# Patient Record
Sex: Female | Born: 1950 | Race: White | Hispanic: No | Marital: Single | Smoking: Never smoker
Health system: Southern US, Community
[De-identification: ages and names within clinical notes are randomized; demographics above are authoritative.]

## PROBLEM LIST (undated history)

## (undated) DIAGNOSIS — Z8669 Personal history of other diseases of the nervous system and sense organs: Secondary | ICD-10-CM

## (undated) DIAGNOSIS — M179 Osteoarthritis of knee, unspecified: Secondary | ICD-10-CM

## (undated) DIAGNOSIS — Z972 Presence of dental prosthetic device (complete) (partial): Secondary | ICD-10-CM

## (undated) DIAGNOSIS — M8580 Other specified disorders of bone density and structure, unspecified site: Secondary | ICD-10-CM

## (undated) DIAGNOSIS — C50919 Malignant neoplasm of unspecified site of unspecified female breast: Secondary | ICD-10-CM

## (undated) DIAGNOSIS — E669 Obesity, unspecified: Secondary | ICD-10-CM

## (undated) DIAGNOSIS — R2689 Other abnormalities of gait and mobility: Secondary | ICD-10-CM

## (undated) DIAGNOSIS — M1710 Unilateral primary osteoarthritis, unspecified knee: Secondary | ICD-10-CM

## (undated) DIAGNOSIS — D0510 Intraductal carcinoma in situ of unspecified breast: Secondary | ICD-10-CM

## (undated) HISTORY — DX: Other specified disorders of bone density and structure, unspecified site: M85.80

## (undated) HISTORY — DX: Other abnormalities of gait and mobility: R26.89

## (undated) HISTORY — DX: Osteoarthritis of knee, unspecified: M17.9

## (undated) HISTORY — PX: MULTIPLE TOOTH EXTRACTIONS: SHX2053

## (undated) HISTORY — DX: Personal history of other diseases of the nervous system and sense organs: Z86.69

## (undated) HISTORY — DX: Unilateral primary osteoarthritis, unspecified knee: M17.10

## (undated) HISTORY — DX: Obesity, unspecified: E66.9

## (undated) HISTORY — DX: Intraductal carcinoma in situ of unspecified breast: D05.10

## (undated) HISTORY — DX: Malignant neoplasm of unspecified site of unspecified female breast: C50.919

---

## 2007-10-07 ENCOUNTER — Ambulatory Visit (HOSPITAL_COMMUNITY): Admission: RE | Admit: 2007-10-07 | Discharge: 2007-10-07 | Payer: Self-pay | Admitting: Family Medicine

## 2009-12-16 HISTORY — PX: MASTECTOMY, PARTIAL: SHX709

## 2010-03-21 ENCOUNTER — Ambulatory Visit (HOSPITAL_COMMUNITY): Admission: RE | Admit: 2010-03-21 | Discharge: 2010-03-21 | Payer: Self-pay | Admitting: Family Medicine

## 2010-03-30 ENCOUNTER — Encounter: Admission: RE | Admit: 2010-03-30 | Discharge: 2010-03-30 | Payer: Self-pay | Admitting: Family Medicine

## 2010-04-16 ENCOUNTER — Ambulatory Visit (HOSPITAL_COMMUNITY): Payer: Self-pay | Admitting: Oncology

## 2010-04-16 ENCOUNTER — Encounter (HOSPITAL_COMMUNITY): Admission: RE | Admit: 2010-04-16 | Discharge: 2010-05-16 | Payer: Self-pay | Admitting: Oncology

## 2010-04-30 ENCOUNTER — Ambulatory Visit (HOSPITAL_COMMUNITY): Admission: RE | Admit: 2010-04-30 | Discharge: 2010-04-30 | Payer: Self-pay | Admitting: Oncology

## 2010-06-04 ENCOUNTER — Ambulatory Visit (HOSPITAL_COMMUNITY): Payer: Self-pay | Admitting: Oncology

## 2010-07-18 ENCOUNTER — Ambulatory Visit (HOSPITAL_COMMUNITY): Admission: RE | Admit: 2010-07-18 | Discharge: 2010-07-18 | Payer: Self-pay | Admitting: Oncology

## 2010-08-27 ENCOUNTER — Inpatient Hospital Stay (HOSPITAL_COMMUNITY): Admission: RE | Admit: 2010-08-27 | Discharge: 2010-08-29 | Payer: Self-pay | Admitting: General Surgery

## 2010-08-27 ENCOUNTER — Encounter (INDEPENDENT_AMBULATORY_CARE_PROVIDER_SITE_OTHER): Payer: Self-pay | Admitting: General Surgery

## 2010-10-04 ENCOUNTER — Ambulatory Visit
Admission: RE | Admit: 2010-10-04 | Discharge: 2010-12-13 | Payer: Self-pay | Source: Home / Self Care | Attending: Radiation Oncology | Admitting: Radiation Oncology

## 2010-10-19 ENCOUNTER — Encounter (HOSPITAL_COMMUNITY): Admission: RE | Admit: 2010-10-19 | Discharge: 2010-11-18 | Payer: Self-pay | Admitting: Oncology

## 2010-10-19 ENCOUNTER — Ambulatory Visit (HOSPITAL_COMMUNITY): Payer: Self-pay | Admitting: Oncology

## 2011-01-06 ENCOUNTER — Encounter: Payer: Self-pay | Admitting: Family Medicine

## 2011-01-21 ENCOUNTER — Encounter (HOSPITAL_COMMUNITY): Admission: RE | Admit: 2011-01-21 | Payer: Self-pay | Source: Home / Self Care | Admitting: Oncology

## 2011-01-21 ENCOUNTER — Other Ambulatory Visit (HOSPITAL_COMMUNITY): Payer: Self-pay | Admitting: Oncology

## 2011-01-21 ENCOUNTER — Ambulatory Visit (HOSPITAL_COMMUNITY): Payer: Medicaid Other | Admitting: Oncology

## 2011-01-21 ENCOUNTER — Encounter (HOSPITAL_COMMUNITY): Payer: Medicaid Other | Attending: Oncology

## 2011-01-21 DIAGNOSIS — C50919 Malignant neoplasm of unspecified site of unspecified female breast: Secondary | ICD-10-CM | POA: Insufficient documentation

## 2011-01-21 DIAGNOSIS — D0590 Unspecified type of carcinoma in situ of unspecified breast: Secondary | ICD-10-CM

## 2011-01-21 DIAGNOSIS — Z79899 Other long term (current) drug therapy: Secondary | ICD-10-CM | POA: Insufficient documentation

## 2011-01-21 LAB — CBC
Hemoglobin: 12.9 g/dL (ref 12.0–15.0)
MCH: 30.7 pg (ref 26.0–34.0)
MCHC: 34.3 g/dL (ref 30.0–36.0)
MCV: 89.5 fL (ref 78.0–100.0)
RDW: 12.8 % (ref 11.5–15.5)
WBC: 6.4 10*3/uL (ref 4.0–10.5)

## 2011-01-21 LAB — DIFFERENTIAL
Eosinophils Absolute: 0.2 10*3/uL (ref 0.0–0.7)
Eosinophils Relative: 3 % (ref 0–5)
Lymphocytes Relative: 21 % (ref 12–46)
Monocytes Absolute: 0.4 10*3/uL (ref 0.1–1.0)
Neutrophils Relative %: 70 % (ref 43–77)

## 2011-01-21 LAB — COMPREHENSIVE METABOLIC PANEL
ALT: 18 U/L (ref 0–35)
Alkaline Phosphatase: 54 U/L (ref 39–117)
CO2: 25 mEq/L (ref 19–32)
Creatinine, Ser: 0.81 mg/dL (ref 0.4–1.2)
GFR calc Af Amer: >60 mL/min (ref >60)
GFR calc non Af Amer: >60 mL/min (ref >60)
Glucose, Bld: 110 mg/dL — ABNORMAL HIGH (ref 70–99)
Sodium: 141 mEq/L (ref 135–145)
Total Bilirubin: 0.6 mg/dL (ref 0.3–1.2)

## 2011-02-28 LAB — CBC
HCT: 36.4 % (ref 36.0–46.0)
Hemoglobin: 10.9 g/dL — ABNORMAL LOW (ref 12.0–15.0)
MCH: 30.9 pg (ref 26.0–34.0)
MCH: 31 pg (ref 26.0–34.0)
MCHC: 33.4 g/dL (ref 30.0–36.0)
MCHC: 33.5 g/dL (ref 30.0–36.0)
MCHC: 33.6 g/dL (ref 30.0–36.0)
MCHC: 33.9 g/dL (ref 30.0–36.0)
MCV: 92.2 fL (ref 78.0–100.0)
MCV: 92.5 fL (ref 78.0–100.0)
Platelets: 219 10*3/uL (ref 150–400)
Platelets: 261 10*3/uL (ref 150–400)
RBC: 3.32 MIL/uL — ABNORMAL LOW (ref 3.87–5.11)
RBC: 3.51 MIL/uL — ABNORMAL LOW (ref 3.87–5.11)
RBC: 3.98 MIL/uL (ref 3.87–5.11)
RDW: 13 % (ref 11.5–15.5)
RDW: 13.2 % (ref 11.5–15.5)
WBC: 8.7 10*3/uL (ref 4.0–10.5)

## 2011-02-28 LAB — BASIC METABOLIC PANEL
BUN: 11 mg/dL (ref 6–23)
Calcium: 9.1 mg/dL (ref 8.4–10.5)
Chloride: 103 mEq/L (ref 96–112)
Creatinine, Ser: 0.73 mg/dL (ref 0.4–1.2)
GFR calc Af Amer: >60 mL/min (ref >60)
GFR calc Af Amer: >60 mL/min (ref >60)
GFR calc non Af Amer: >60 mL/min (ref >60)
Sodium: 142 mEq/L (ref 135–145)

## 2011-02-28 LAB — DIFFERENTIAL
Basophils Absolute: 0 10*3/uL (ref 0.0–0.1)
Basophils Absolute: 0 10*3/uL (ref 0.0–0.1)
Basophils Absolute: 0 10*3/uL (ref 0.0–0.1)
Basophils Absolute: 0.2 10*3/uL — ABNORMAL HIGH (ref 0.0–0.1)
Basophils Relative: 0 % (ref 0–1)
Basophils Relative: 1 % (ref 0–1)
Basophils Relative: 2 % — ABNORMAL HIGH (ref 0–1)
Eosinophils Relative: 0 % (ref 0–5)
Lymphocytes Relative: 36 % (ref 12–46)
Lymphs Abs: 2.2 10*3/uL (ref 0.7–4.0)
Lymphs Abs: 2.7 10*3/uL (ref 0.7–4.0)
Monocytes Absolute: 0.1 10*3/uL (ref 0.1–1.0)
Monocytes Absolute: 0.4 10*3/uL (ref 0.1–1.0)
Monocytes Absolute: 0.5 10*3/uL (ref 0.1–1.0)
Monocytes Relative: 1 % — ABNORMAL LOW (ref 3–12)
Monocytes Relative: 5 % (ref 3–12)
Monocytes Relative: 6 % (ref 3–12)
Neutro Abs: 5.9 10*3/uL (ref 1.7–7.7)
Neutrophils Relative %: 56 % (ref 43–77)
Neutrophils Relative %: 68 % (ref 43–77)
Neutrophils Relative %: 80 % — ABNORMAL HIGH (ref 43–77)

## 2011-02-28 LAB — SURGICAL PCR SCREEN
MRSA, PCR: NEGATIVE
Staphylococcus aureus: POSITIVE — AB

## 2011-02-28 LAB — TYPE AND SCREEN
ABO/RH(D): O POS
Antibody Screen: NEGATIVE

## 2011-02-28 LAB — GLUCOSE, CAPILLARY: Glucose-Capillary: 139 mg/dL — ABNORMAL HIGH (ref 70–99)

## 2011-03-05 LAB — COMPREHENSIVE METABOLIC PANEL
Albumin: 4 g/dL (ref 3.5–5.2)
Alkaline Phosphatase: 58 U/L (ref 39–117)
BUN: 14 mg/dL (ref 6–23)
Calcium: 9.3 mg/dL (ref 8.4–10.5)
Creatinine, Ser: 0.74 mg/dL (ref 0.4–1.2)
Potassium: 3.7 mEq/L (ref 3.5–5.1)
Total Protein: 7.4 g/dL (ref 6.0–8.3)

## 2011-03-05 LAB — CBC
HCT: 38.2 % (ref 36.0–46.0)
Platelets: 277 10*3/uL (ref 150–400)
RDW: 13.2 % (ref 11.5–15.5)

## 2011-03-05 LAB — DIFFERENTIAL
Lymphocytes Relative: 27 % (ref 12–46)
Lymphs Abs: 2.1 10*3/uL (ref 0.7–4.0)
Monocytes Absolute: 0.4 10*3/uL (ref 0.1–1.0)
Monocytes Relative: 5 % (ref 3–12)
Neutro Abs: 5.1 10*3/uL (ref 1.7–7.7)

## 2011-03-05 LAB — LUTEINIZING HORMONE: LH: 39.6 m[IU]/mL

## 2011-03-05 LAB — ANA: Anti Nuclear Antibody(ANA): NEGATIVE

## 2011-06-21 ENCOUNTER — Ambulatory Visit
Admission: RE | Admit: 2011-06-21 | Discharge: 2011-06-21 | Disposition: A | Discharge status: Home / Self Care | Payer: Medicaid Other | Source: Ambulatory Visit | Attending: Radiation Oncology | Admitting: Radiation Oncology

## 2011-07-22 ENCOUNTER — Encounter (HOSPITAL_COMMUNITY): Payer: Medicaid Other | Attending: Oncology | Admitting: Oncology

## 2011-07-22 ENCOUNTER — Ambulatory Visit (HOSPITAL_COMMUNITY): Payer: Medicaid Other

## 2011-07-22 ENCOUNTER — Encounter (HOSPITAL_COMMUNITY): Payer: Self-pay | Admitting: Oncology

## 2011-07-22 VITALS — BP 138/80 | HR 99 | Temp 98.3°F | Wt 260.1 lb

## 2011-07-22 DIAGNOSIS — D0590 Unspecified type of carcinoma in situ of unspecified breast: Secondary | ICD-10-CM

## 2011-07-22 DIAGNOSIS — Z79899 Other long term (current) drug therapy: Secondary | ICD-10-CM | POA: Insufficient documentation

## 2011-07-22 DIAGNOSIS — D0510 Intraductal carcinoma in situ of unspecified breast: Secondary | ICD-10-CM | POA: Insufficient documentation

## 2011-07-22 DIAGNOSIS — C50919 Malignant neoplasm of unspecified site of unspecified female breast: Secondary | ICD-10-CM

## 2011-07-22 HISTORY — DX: Intraductal carcinoma in situ of unspecified breast: D05.10

## 2011-07-22 HISTORY — DX: Malignant neoplasm of unspecified site of unspecified female breast: C50.919

## 2011-07-22 LAB — COMPREHENSIVE METABOLIC PANEL
ALT: 12 U/L (ref 0–35)
Albumin: 3.9 g/dL (ref 3.5–5.2)
Alkaline Phosphatase: 64 U/L (ref 39–117)
BUN: 14 mg/dL (ref 6–23)
Chloride: 103 mEq/L (ref 96–112)
GFR calc Af Amer: >60 mL/min (ref >60)
Glucose, Bld: 112 mg/dL — ABNORMAL HIGH (ref 70–99)
Potassium: 4.3 mEq/L (ref 3.5–5.1)
Sodium: 143 mEq/L (ref 135–145)
Total Bilirubin: 0.5 mg/dL (ref 0.3–1.2)

## 2011-07-22 LAB — CBC
HCT: 41.1 % (ref 36.0–46.0)
Hemoglobin: 13.6 g/dL (ref 12.0–15.0)
WBC: 6.5 10*3/uL (ref 4.0–10.5)

## 2011-07-22 LAB — DIFFERENTIAL
Basophils Relative: 0 % (ref 0–1)
Eosinophils Absolute: 0.1 10*3/uL (ref 0.0–0.7)
Eosinophils Relative: 1 % (ref 0–5)
Monocytes Relative: 6 % (ref 3–12)
Neutrophils Relative %: 71 % (ref 43–77)

## 2011-07-22 NOTE — Progress Notes (Signed)
Deanna Reichert, MD [address] [place] [address]  1. DCIS (ductal carcinoma in situ) of breast  CBC, Differential, Comprehensive metabolic panel  2. Invasive ductal carcinoma of breast      CURRENT THERAPY: On Femara daily.  S/P radiation on 10/26/11- 12/13/10.  S/P modified radical mastectomy on left   INTERVAL HISTORY: Deanna Henderson 60 y.o. female returns for  regular  visit for followup of Ductal Carcinoma In Situ.  On biopsy, an invasive component was seen with papillary features, but at time of surgery only DCIS was discovered.  See path below for more details.   The patient denies any complaints today.  She reports that she has not had any fevers, chills, night sweats, myalgias, arthralgias, and hot flashes.  She reports that her joint aches are no worse since starting femara.  She denies any tobacco abuse.  She does explain that her knees have been bothering her more today because yesterday her apartment complex underwent a fire drill and she had to stand for a long period of time.  And then she had to go to [employer] for shopping.  She reports that she had an increase in activity yesterday and associates her knee pain with that.  The patient explains that she will be undergoing a mammogram in the near future.  She explains that she has "cycts" that appear and then resolve in a few days.  She explains that sometimes they are tender.  Past Medical History  Diagnosis Date  . 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 Invasive ductal carcinoma of breast 07/22/2011    has DCIS (ductal carcinoma in situ) of breast and Invasive ductal carcinoma of breast on her problem list.      has no known allergies.  Ms. Mcauliffe does not currently have medications on file.  No past surgical history on file.  Denies any headaches, dizziness, double vision, fevers, chills, night sweats, nausea, vomiting, diarrhea, constipation, chest pain, heart palpitations, shortness of  breath, blood in stool, black tarry stool, urinary pain, urinary burning, urinary frequency, hematuria.   PHYSICAL EXAMINATION  ECOG PERFORMANCE STATUS: 2 - Symptomatic, <50% confined to bed  Filed Vitals:   07/22/11 0951  BP: 138/80  Pulse: 99  Temp: 98.3 F (36.8 C)    GENERAL:alert, no distress, well nourished, well developed, comfortable, cooperative and obese SKIN: skin color, texture, turgor are normal, no rashes or significant lesions HEAD: Normocephalic, No masses, lesions, tenderness or abnormalities EYES: normal, PERRLA, EOMI EARS: External ears normal OROPHARYNX:mucous membranes are moist  NECK: supple, no adenopathy, no bruits, no JVD, thyroid normal size, non-tender, without nodularity, no stridor, non-tender, trachea midline LYMPH:  no palpable lymphadenopathy, no hepatosplenomegaly BREAST:right breast normal without mass, skin or nipple changes or axillary nodes, Left breast reveals mastectomy scar.  No abnormalities or masses noted. LUNGS: clear to auscultation and percussion HEART: regular rate & rhythm, no murmurs, no gallops, S1 normal and S2 normal ABDOMEN:abdomen soft, non-tender, obese, normal bowel sounds and no hepatosplenomegaly BACK: Back symmetric, no curvature., No CVA tenderness EXTREMITIES:less then 2 second capillary refill, no joint deformities, effusion, or inflammation, no edema  NEURO: alert & oriented x 3 with fluent speech, no focal motor/sensory deficits, gait normal  PENDING LABS: CBC diff, CMET   PATHOLOGY: 1. Breast, modified radical mastectomy, left and axilla- residual ductal carcinoma in situ, papillary pattern, 5.0 cm.  No stromal invasion identified.  All final resection margins clear, 07 lymph nodes for metastatic disease.  Er 100%, PR 100%, Ki-67 16%, Her 2 negative.    ASSESSMENT:  1. DCIS, s/p mastectomy on left. S/p radiation from 10/25/10- 12/13/10.  On Femara daily. 2. Obese   PLAN:  1. The patient will be seeing her PCP  soon.  She will have her yearly mammogram scheduled at that time. 2. We will keep an eye out for mammogram results 3. Lab work today: CBC diff, CMET 4. Return in 6 months for follow-up.   All questions were answered. The patient knows to call the clinic with any problems, questions or concerns. We can certainly see the patient much sooner if necessary.  The patient and plan discussed with Pierce Crane, MD, [facility] and he is in agreement with the aforementioned.  I spent 25 minutes counseling the patient face to face. The total time spent in the appointment was 40 minutes.  KEFALAS,THOMAS

## 2011-07-22 NOTE — Patient Instructions (Signed)
[facility]  Discharge Instructions  RECOMMENDATIONS MADE BY THE CONSULTANT AND ANY TEST RESULTS WILL BE SENT TO YOUR REFERRING DOCTOR.   EXAM FINDINGS BY MD TODAY AND SIGNS AND SYMPTOMS TO REPORT TO CLINIC OR PRIMARY MD: Exam good. You are doing good on your exam.   INSTRUCTIONS GIVEN AND DISCUSSED: Continue taking your Femara. We want to see you back in 6 months. Don't forget to have your mammogram (in which your primary doctor should be setting up). Come back before 6 months if you need to see [place].    I acknowledge that I have been informed and understand all the instructions given to me and received a copy. I do not have any more questions at this time, but understand that I may call the Specialty Clinic at [facility] at [phone] during business hours should I have any further questions or need assistance in obtaining follow-up care.    __________________________________________  _____________  __________ Signature of Patient or Authorized Representative            Date                   Time    __________________________________________ Nurse's Signature

## 2011-07-24 ENCOUNTER — Other Ambulatory Visit (HOSPITAL_COMMUNITY): Payer: Self-pay | Admitting: Family Medicine

## 2011-07-24 DIAGNOSIS — C50919 Malignant neoplasm of unspecified site of unspecified female breast: Secondary | ICD-10-CM

## 2011-08-07 ENCOUNTER — Ambulatory Visit (HOSPITAL_COMMUNITY)
Admission: RE | Admit: 2011-08-07 | Discharge: 2011-08-07 | Disposition: A | Discharge status: Home / Self Care | Payer: Medicaid Other | Source: Ambulatory Visit | Attending: Family Medicine | Admitting: Family Medicine

## 2011-08-07 DIAGNOSIS — C50919 Malignant neoplasm of unspecified site of unspecified female breast: Secondary | ICD-10-CM

## 2011-08-07 DIAGNOSIS — Z853 Personal history of malignant neoplasm of breast: Secondary | ICD-10-CM | POA: Insufficient documentation

## 2012-01-22 ENCOUNTER — Encounter (HOSPITAL_COMMUNITY): Payer: Medicaid Other | Attending: Oncology | Admitting: Oncology

## 2012-01-22 ENCOUNTER — Encounter (HOSPITAL_COMMUNITY): Payer: Self-pay | Admitting: Oncology

## 2012-01-22 VITALS — BP 130/98 | HR 76 | Temp 98.2°F | Wt 267.5 lb

## 2012-01-22 DIAGNOSIS — C50919 Malignant neoplasm of unspecified site of unspecified female breast: Secondary | ICD-10-CM | POA: Insufficient documentation

## 2012-01-22 DIAGNOSIS — E669 Obesity, unspecified: Secondary | ICD-10-CM | POA: Insufficient documentation

## 2012-01-22 HISTORY — DX: Obesity, unspecified: E66.9

## 2012-01-22 LAB — CBC
MCH: 29.8 pg (ref 26.0–34.0)
MCV: 91.5 fL (ref 78.0–100.0)
Platelets: 232 10*3/uL (ref 150–400)
RBC: 4.1 MIL/uL (ref 3.87–5.11)
RDW: 12.8 % (ref 11.5–15.5)

## 2012-01-22 LAB — DIFFERENTIAL
Basophils Absolute: 0 10*3/uL (ref 0.0–0.1)
Basophils Relative: 0 % (ref 0–1)
Eosinophils Absolute: 0.1 10*3/uL (ref 0.0–0.7)
Eosinophils Relative: 1 % (ref 0–5)
Lymphs Abs: 1.4 10*3/uL (ref 0.7–4.0)
Neutrophils Relative %: 73 % (ref 43–77)

## 2012-01-22 LAB — COMPREHENSIVE METABOLIC PANEL
ALT: 13 U/L (ref 0–35)
AST: 17 U/L (ref 0–37)
Albumin: 3.9 g/dL (ref 3.5–5.2)
Alkaline Phosphatase: 58 U/L (ref 39–117)
Calcium: 9.9 mg/dL (ref 8.4–10.5)
GFR calc Af Amer: >90 mL/min (ref >90)
Glucose, Bld: 100 mg/dL — ABNORMAL HIGH (ref 70–99)
Potassium: 3.7 mEq/L (ref 3.5–5.1)
Sodium: 140 mEq/L (ref 135–145)
Total Protein: 7.3 g/dL (ref 6.0–8.3)

## 2012-01-22 NOTE — Progress Notes (Signed)
Henderson Reichert, MD, MD [address] [place] [address]  1. Invasive ductal carcinoma of breast  Nutritional Supplements (EQUATE PO), acetaminophen (TYLENOL) 500 MG tablet, calcium carbonate (TUMS EX) 750 MG chewable tablet, sodium chloride (OCEAN) 0.65 % nasal spray, CBC, Differential, Comprehensive metabolic panel  2. Obesity      CURRENT THERAPY:On Femara daily. S/P radiation on 10/25/10- 12/13/10. S/P modified radical mastectomy on left    INTERVAL HISTORY: Deanna Henderson 61 y.o. female returns for  regular  visit for followup of  Ductal Carcinoma In Situ. On biopsy, an invasive component was seen with papillary features, but at time of surgery only DCIS was discovered. See path below for more details.   The patient continues to take Femara daily.  She is tolerating the medication well.  She is seen with a 4 legged can today.  Her gait is stable once she is up ambulating.    The patient reports that she lost some weight prior to the [place], but then gained it back plus a few pounds.  She weighs 267.5 lbs today.  I personally reviewed and went over radiographic studies with the patient.  I personally reviewed and went over laboratory results with the patient.   ROS: No TIA's or unusual headaches, no dysphagia.  No prolonged cough. No dyspnea or chest pain on exertion.  No abdominal pain, change in bowel habits, black or bloody stools.  No urinary tract symptoms.  No new or unusual musculoskeletal symptoms.  Normal menses, no abnormal vaginal bleeding, discharge or unexpected pelvic pain. No new breast lumps, breast pain or nipple discharge.   Past Medical History  Diagnosis Date  . 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 History of double vision   . Balance problems   . Obesity 01/22/2012    has DCIS (ductal carcinoma in situ) of breast; Invasive ductal carcinoma of breast; and Obesity on her problem list.       is allergic to codeine.  Ms. Smyre does not currently have medications on file.  Past Surgical History  Procedure Date  . Mastectomy, partial     Denies any headaches, dizziness, double vision, fevers, chills, night sweats, nausea, vomiting, diarrhea, constipation, chest pain, heart palpitations, shortness of breath, blood in stool, black tarry stool, urinary pain, urinary burning, urinary frequency, hematuria.   PHYSICAL EXAMINATION  ECOG PERFORMANCE STATUS: 2 - Symptomatic, <50% confined to bed  Filed Vitals:   01/22/12 0952  BP: 130/98  Pulse: 76  Temp: 98.2 F (36.8 C)    GENERAL:alert, no distress, well nourished, well developed, comfortable, cooperative, obese and flat affect SKIN: skin color, texture, turgor are normal, no rashes or significant lesions HEAD: Normocephalic, No masses, lesions, tenderness or abnormalities EYES: normal EARS: External ears normal OROPHARYNX:mucous membranes are moist  NECK: supple, no adenopathy, no bruits, thyroid normal size, non-tender, without nodularity, no stridor, non-tender, trachea midline LYMPH:  no palpable lymphadenopathy, no hepatosplenomegaly BREAST:right breast normal without mass, skin or nipple changes or axillary nodes, left breast post-mastectomy site well healed and free of suspicious changes LUNGS: clear to auscultation and percussion HEART: regular rate & rhythm, no murmurs, no gallops, S1 normal and S2 normal ABDOMEN:abdomen soft, non-tender, obese, normal bowel sounds, no masses or organomegaly and no hepatosplenomegaly BACK: Back symmetric, no curvature., No CVA tenderness EXTREMITIES:less then 2 second capillary refill, no joint deformities, effusion, or inflammation, no skin discoloration, no clubbing, no cyanosis, positive  findings:  edema B/L 2+ non-pitting edema  NEURO: alert & oriented x 3 with fluent speech, no focal motor/sensory deficits   LABORATORY DATA: CBC    Component Value Date/Time   WBC  6.5 07/22/2011 1113   RBC 4.45 07/22/2011 1113   HGB 13.6 07/22/2011 1113   HCT 41.1 07/22/2011 1113   PLT 239 07/22/2011 1113   MCV 92.4 07/22/2011 1113   MCH 30.6 07/22/2011 1113   MCHC 33.1 07/22/2011 1113   RDW 12.8 07/22/2011 1113   LYMPHSABS 1.4 07/22/2011 1113   MONOABS 0.4 07/22/2011 1113   EOSABS 0.1 07/22/2011 1113   BASOSABS 0.0 07/22/2011 1113      Chemistry      Component Value Date/Time   NA 143 07/22/2011 1113   K 4.3 07/22/2011 1113   CL 103 07/22/2011 1113   CO2 25 07/22/2011 1113   BUN 14 07/22/2011 1113   CREATININE 0.67 07/22/2011 1113      Component Value Date/Time   CALCIUM 10.2 07/22/2011 1113   ALKPHOS 64 07/22/2011 1113   AST 18 07/22/2011 1113   ALT 12 07/22/2011 1113   BILITOT 0.5 07/22/2011 1113        PENDING LABS: CBC diff, CMET   RADIOGRAPHIC STUDIES:  08/07/2011  *RADIOLOGY REPORT*  Clinical Data: The patient underwent left modified radical  mastectomy for breast cancer in 2011.  DIGITAL DIAGNOSTIC BILATERAL MAMMOGRAM WITH CAD  Comparison: 03/21/2010 bilaterally. 07/18/2010 on the left.  Findings: On the right, there are scattered fibroglandular  densities. There is no dominant mass, architectural distortion or  calcification to suggest malignancy. The technologist obtained  images of the mastectomy site on the left but no abnormality is  noted on this side. The patient states that her surgeon left some  fatty tissue on the left to allow for future reconstruction should  she so desire.  Mammographic images were processed with CAD.  IMPRESSION:  No mammographic evidence of malignancy. Yearly screening  mammography of the right breast would be suggested.  BI-RADS CATEGORY 1: Negative.  Original Report Authenticated By: Daryl Eastern, M.D.    PATHOLOGY: 1. Breast, modified radical mastectomy, left and axilla- residual ductal carcinoma in situ, papillary pattern, 5.0 cm. No stromal invasion identified. All final resection margins clear, 0/7 lymph nodes for metastatic  disease. Er 100%, PR 100%, Ki-67 16%, Her 2 negative.     ASSESSMENT: 1. DCIS, s/p mastectomy on left. S/p radiation from 10/25/10- 12/13/10. On Femara daily which she is tolerating well.  2. Obese    PLAN:  1. Lab work today: CBC diff, CMET 2. I personally reviewed and went over radiographic studies with the patient. 3. I personally reviewed and went over laboratory results with the patient. 4. Continue taking Femara daily 5. Return in 6 months for follow-up.    All questions were answered. The patient knows to call the clinic with any problems, questions or concerns. We can certainly see the patient much sooner if necessary.  The patient and plan discussed with Glenford Peers, MD and he is in agreement with the aforementioned.  I spent 20 minutes counseling the patient face to face. The total time spent in the appointment was 30 minutes.  KEFALAS,THOMAS

## 2012-01-22 NOTE — Patient Instructions (Signed)
Deanna Henderson Asencio  [id number] 19-Aug-1951  [facility]  Discharge Instructions  RECOMMENDATIONS MADE BY THE CONSULTANT AND ANY TEST RESULTS WILL BE SENT TO YOUR REFERRING DOCTOR.   EXAM FINDINGS BY MD TODAY AND SIGNS AND SYMPTOMS TO REPORT TO CLINIC OR PRIMARY MD: you are doing well.  Continue letrazole.  MEDICATIONS PRESCRIBED: none   INSTRUCTIONS GIVEN AND DISCUSSED: Report any new lumps, bone pain or shortness of breath.  SPECIAL INSTRUCTIONS/FOLLOW-UP: Lab work Needed today and Return to Clinic in 6 months.   I acknowledge that I have been informed and understand all the instructions given to me and received a copy. I do not have any more questions at this time, but understand that I may call the [facility]ty Clinic at [facility] at [phone] during business hours should I have any further questions or need assistance in obtaining follow-up care.    __________________________________________  _____________  __________ Signature of Patient or Authorized Representative            Date                   Time    __________________________________________ Nurse's Signature

## 2012-01-22 NOTE — Progress Notes (Signed)
Deanna Henderson presented for labwork. Labs per MD order drawn via Peripheral Line 23 gauge needle inserted in right hand  Good blood return present. Procedure without incident.  Needle removed intact. Patient tolerated procedure well.

## 2012-03-20 IMAGING — MG MM DIGITAL DIAGNOSTIC BILAT CAD
7 of 13 series · 7 of 13 positions shown · non-contrast
Comparison: There are no prior studies available for comparison
(this is the patient's baseline study).

CLINICAL DATA: The patient has noticed spontaneous bloody left
breast nipple discharge with inversion of the left nipple.  Also,
there is thickening within the superior portion of the left breast.

DIGITAL DIAGNOSTIC BILATERAL MAMMOGRAM WITH CAD AND BILATERAL
BREAST ULTRASOUND:

[L CC (1 of 3)]
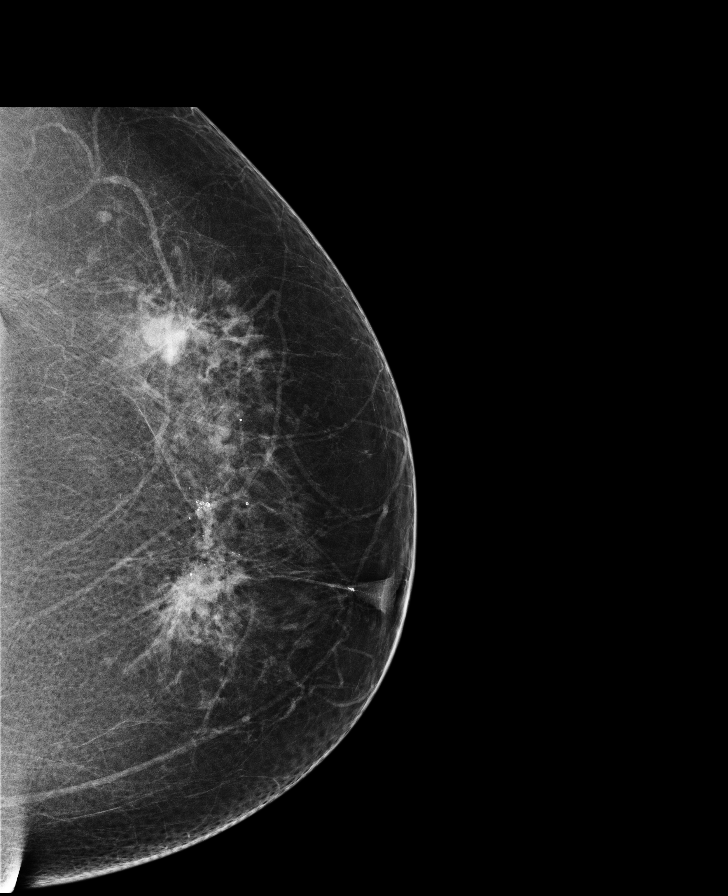

[L MLO]
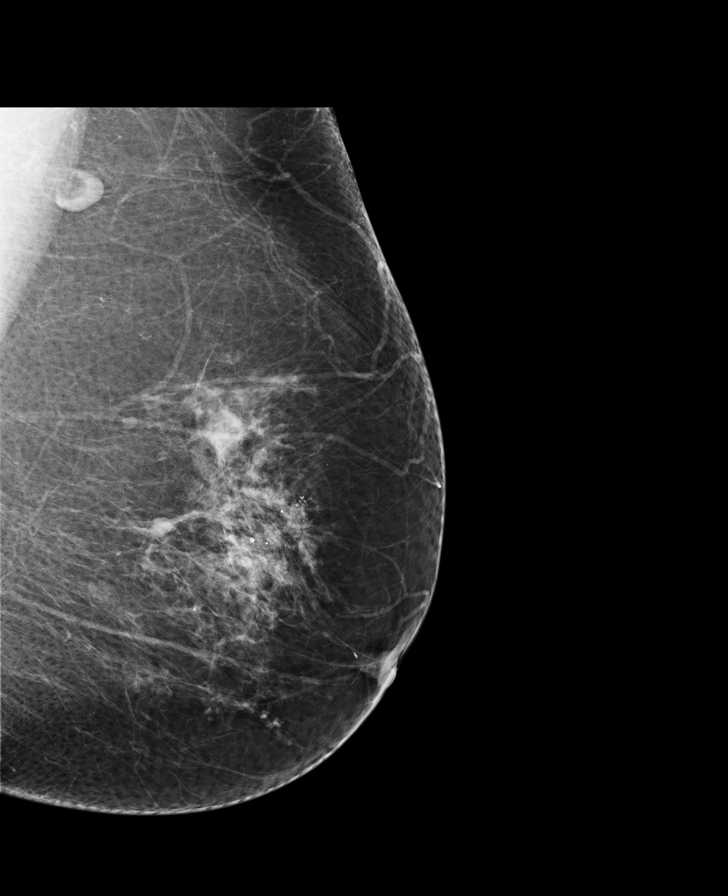

[R CC (1 of 2)]
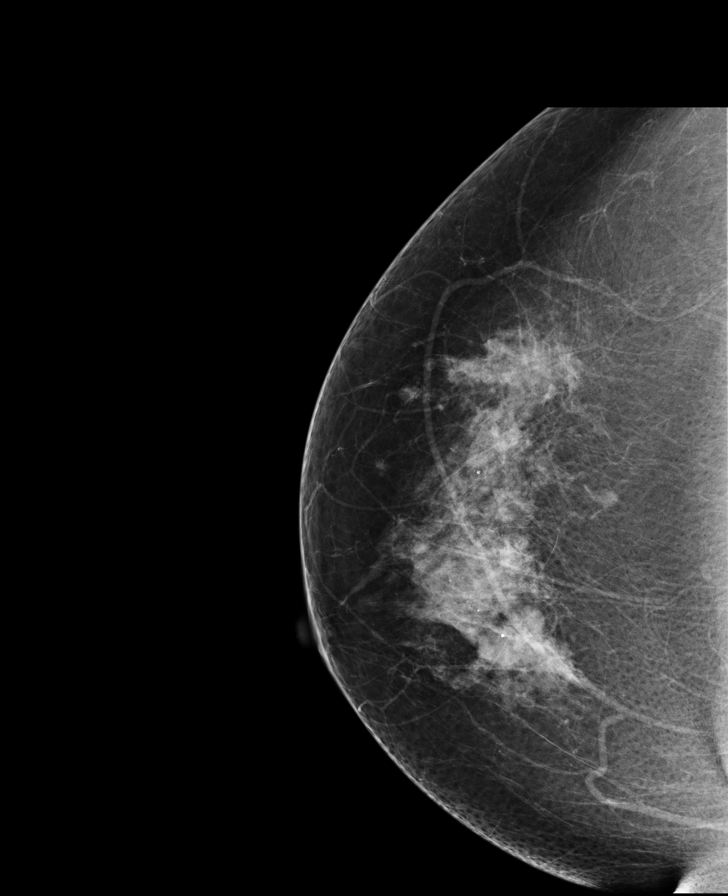

[R MLO]
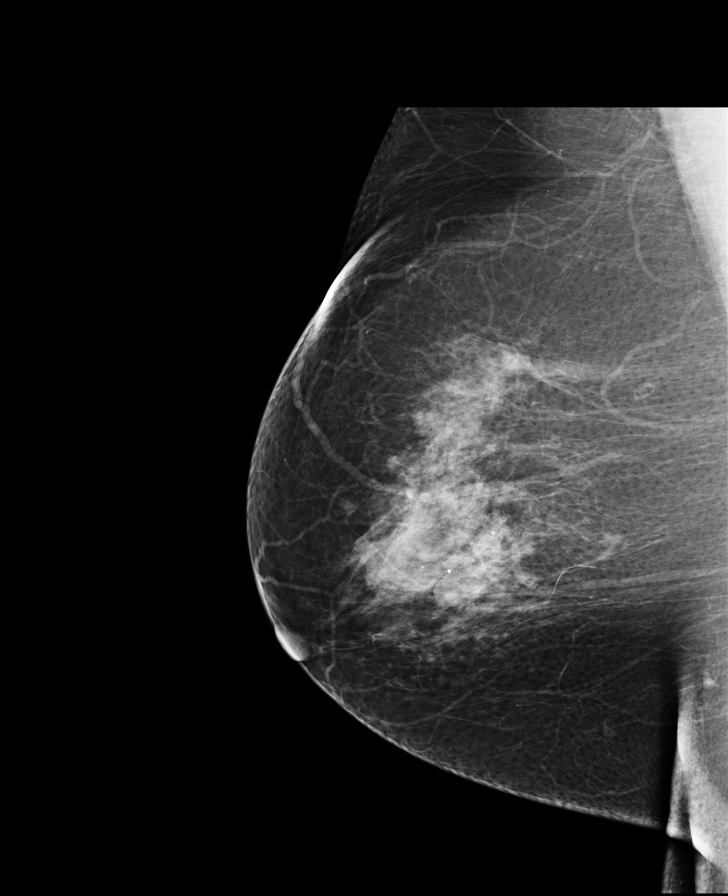

[R CC (2 of 2)]
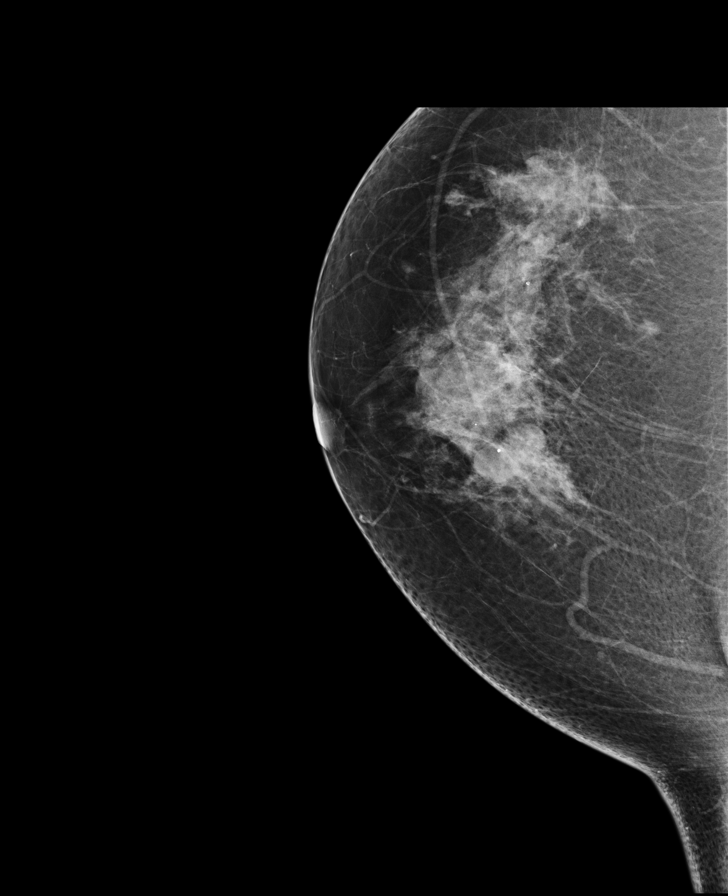

[L CC (2 of 3)]
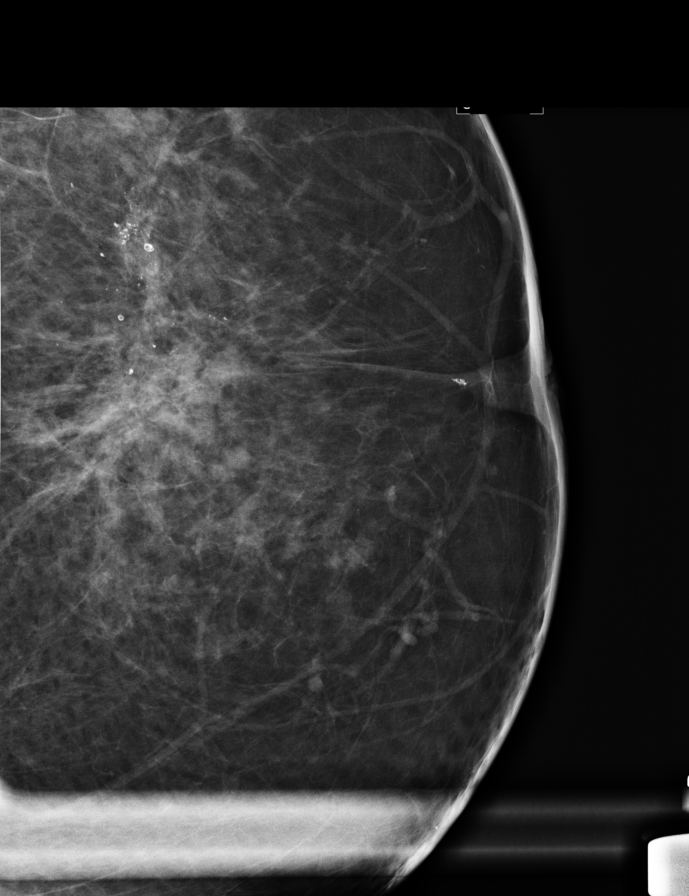

[L CC (3 of 3)]
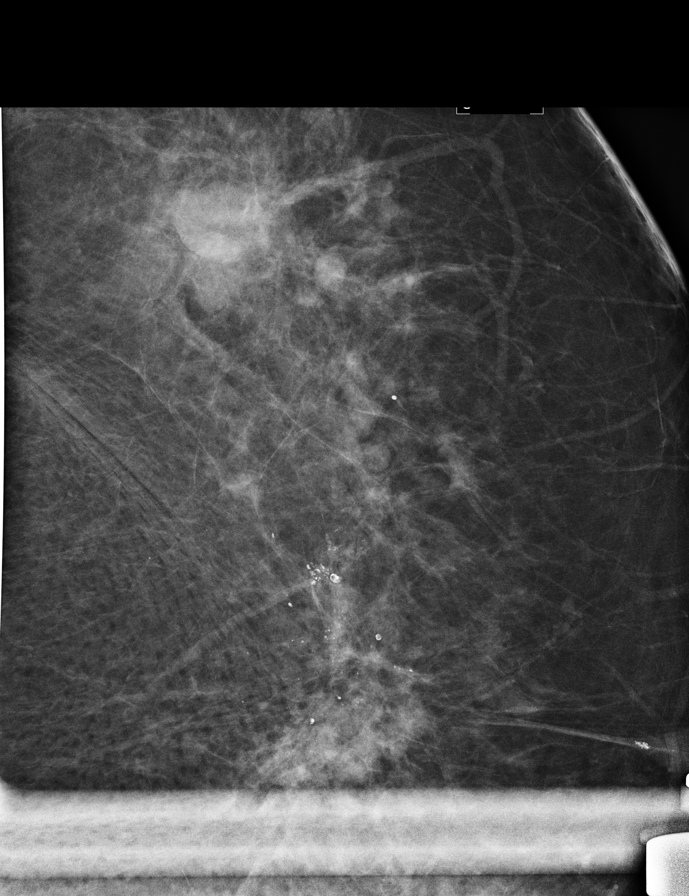

[7 of 13 positions shown; findings below may reference images not displayed]

FINDINGS: There is a scattered fibroglandular parenchymal pattern
present.  There is an area of distortion located superiorly within
the left breast located at approximately the 12 o'clock position 2-
3 cm superior to the nipple.  In this area of distortion there are
numerous pleomorphic microcalcifications present which on
magnification views are worrisome for possible malignant
calcifications.  In addition, there is linear extension toward the
nipple with inversion of the nipple and calcifications seen
posterior to the nipple which are worrisome for malignant
calcifications within the ductal system  immediately posterior to
the left nipple.  The area of distortion on mammography measures
maximally approximately 5 cm in diameter.  In addition, there are
numerous partially circumscribed nodules present bilaterally within
the central portion of the right breast and also with the upper-
outer quadrant of the left breast.  There is a mildly prominent low
left axillary lymph node present.  However, a central fatty hilum
remains associated with this lymph node.
Mammographic images were processed with CAD.

On physical exam, there is thickening within the superior portion
of the left breast located 2-3 cm superior to the nipple.  The left
nipple is inverted and bloody nipple discharge could be elicited
with compression.

Ultrasound is performed, showing distortion with shadowing located
within the superior left breast at the 12 o'clock position 2 cm
from the nipple.  This is associated with several adjacent
hypoechoic irregular nodules which in composite measure 1.5 cm in
diameter.  In addition, there are calcifications associated with
these nodules.  When measuring both the area of distortion and the
hypoechoic nodules in their entirety,  this measures 2.3 cm in
diameter by ultrasound.  This area is worrisome for carcinoma of
the breast and tissue sampling is recommended.  I have discussed
ultrasound-guided core biopsy of this area with the patient.  She
would like to proceed with this and this will be performed and
reported separately.

Ultrasound of the left axilla demonstrates normal-appearing left
axillary lymph nodes with normally retained fatty hilar regions.
There is no worrisome adenopathy within the left axilla.

Also, ultrasound of the breasts bilaterally demonstrate numerous
bilateral simple cysts accounting for the numerous bilateral
circumscribed nodules noted on mammography.
IMPRESSION: 1.  Distortion with worrisome microcalcifications located within
the left breast at the 12 o'clock position. The findings are
worrisome for possible invasive malignancy with DCIS and extension
to the left nipple (producing bloody left nipple discharge).
2.  No evidence for enlarged left axillary adenopathy.
3.  Bilateral simple appearing cysts.

Tissue sampling of the abnormality within the left breast located
at the 12 o'clock position is recommended and ultrasound-guided
core biopsy will be performed and reported separately.

BI-RADS CATEGORY 5:  Highly suggestive of malignancy - appropriate
action should be taken.

## 2012-03-20 IMAGING — US US BREAST BILATERAL
1 series · 12 of 20 positions shown · non-contrast
Comparison: There are no prior studies available for comparison
(this is the patient's baseline study).

CLINICAL DATA: The patient has noticed spontaneous bloody left
breast nipple discharge with inversion of the left nipple.  Also,
there is thickening within the superior portion of the left breast.

DIGITAL DIAGNOSTIC BILATERAL MAMMOGRAM WITH CAD AND BILATERAL
BREAST ULTRASOUND:

[Series 1: us breast bilateral · 0.08mm/px · 12 of 20 slices shown]
[im 1/20]
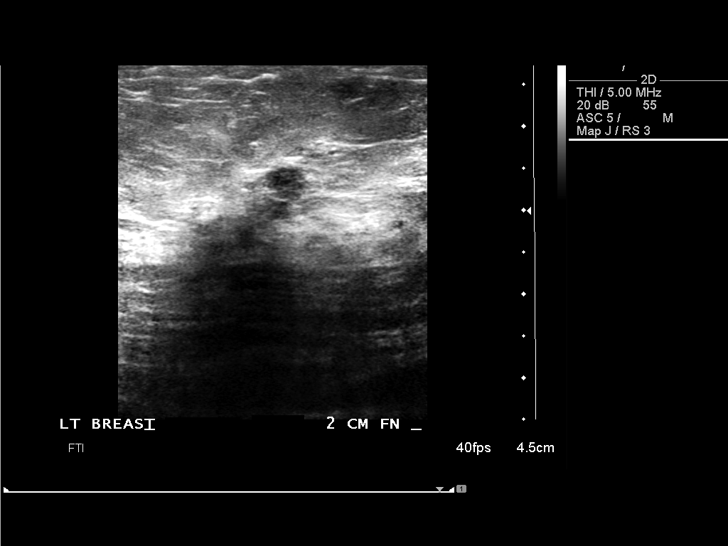
[im 3/20]
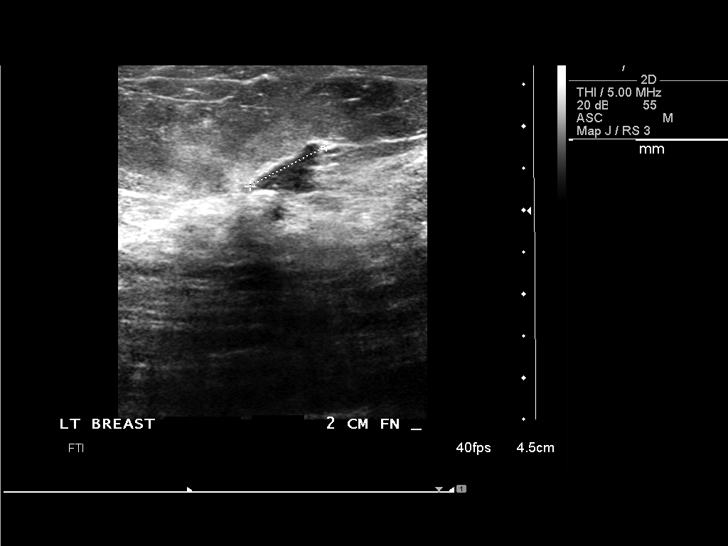
[im 5/20]
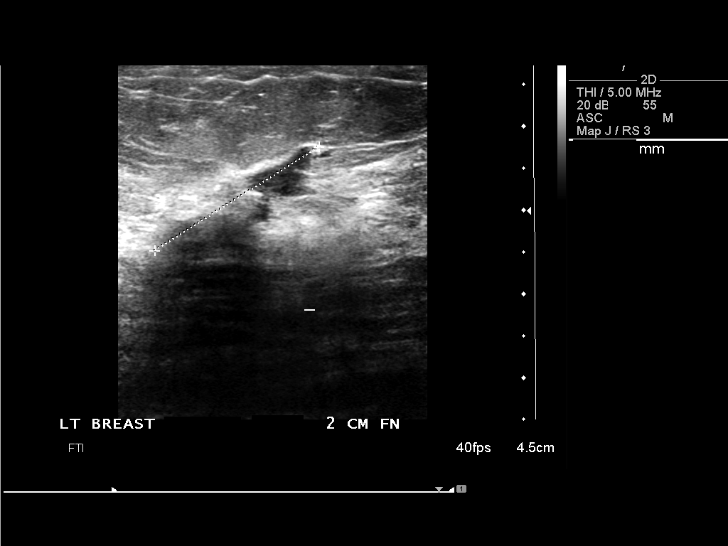
[im 6/20]
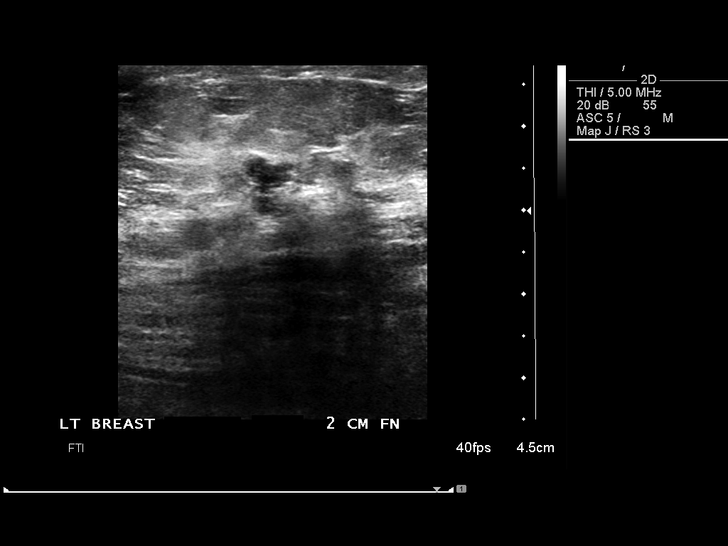
[im 8/20]
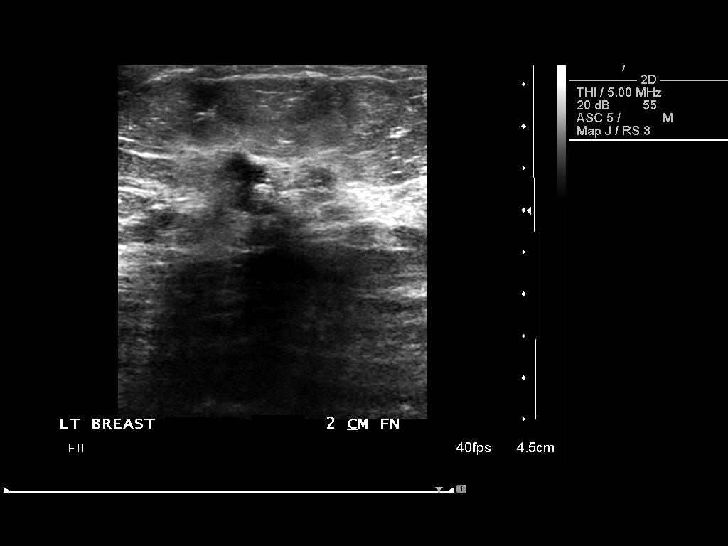
[im 10/20]
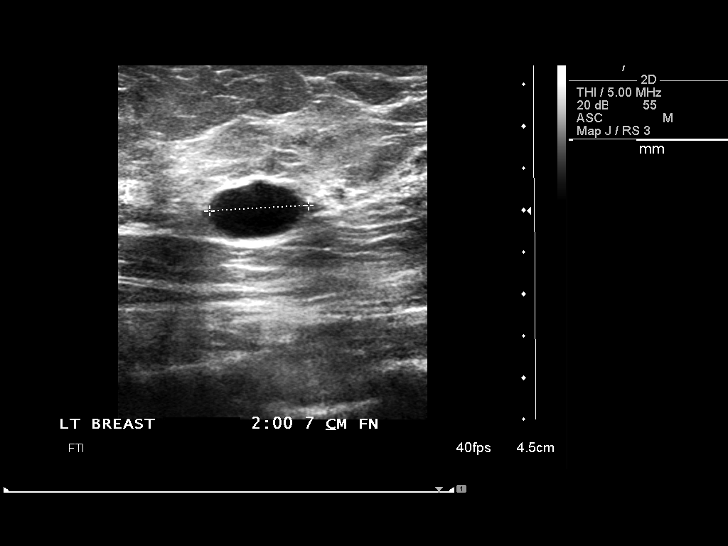
[im 11/20]
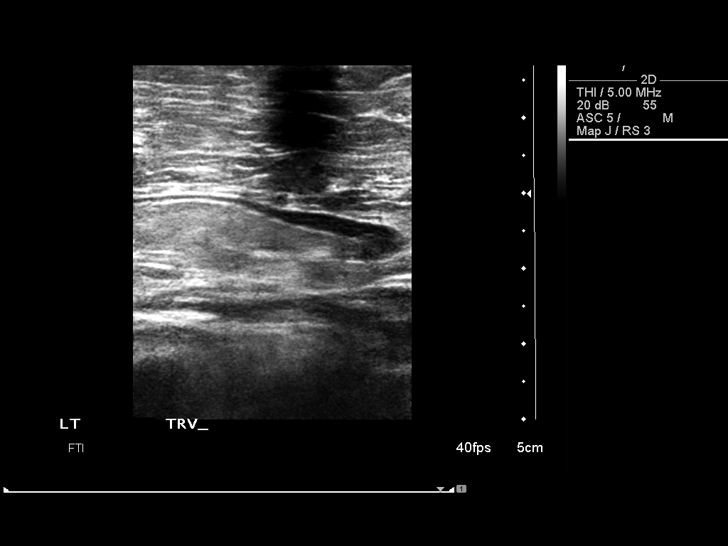
[im 13/20]
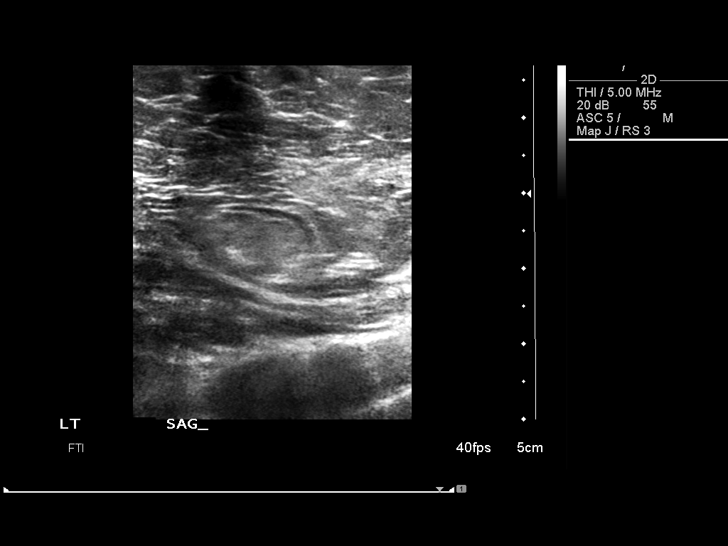
[im 15/20]
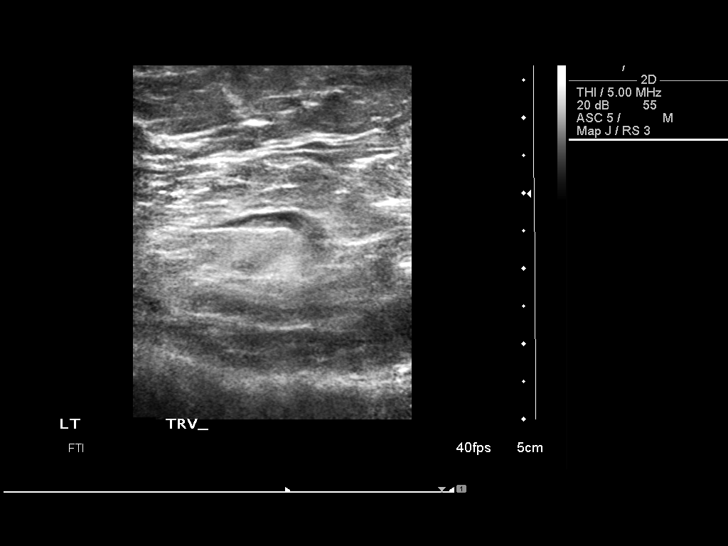
[im 16/20]
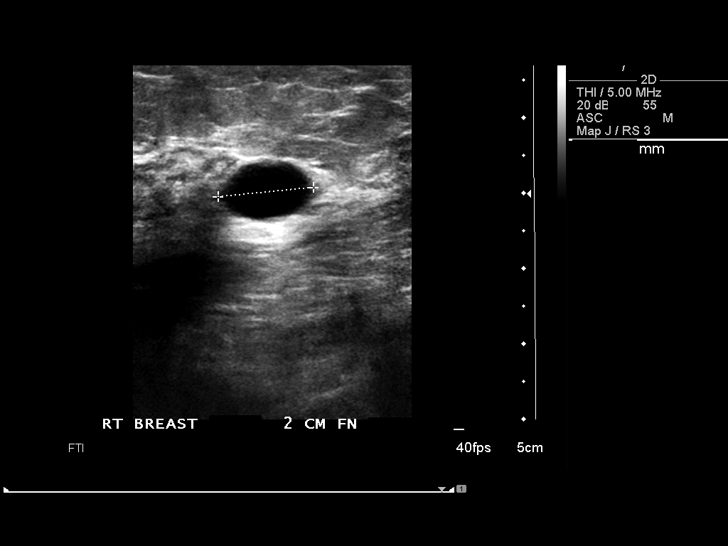
[im 18/20]
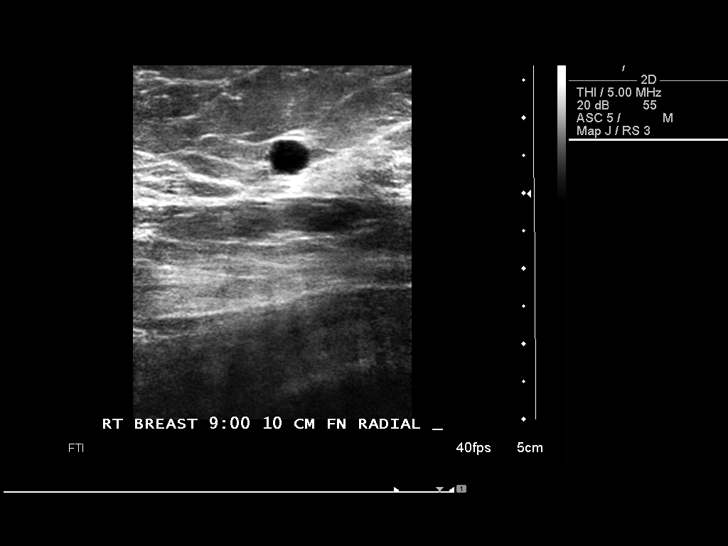
[im 20/20]
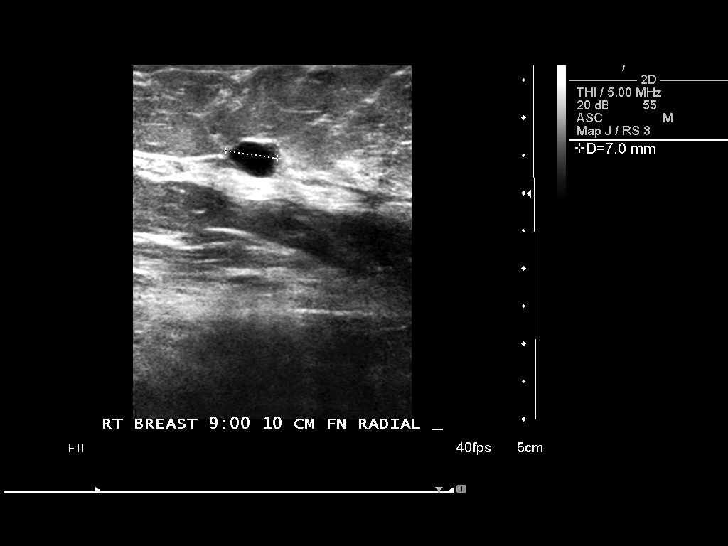

[12 of 20 positions shown; findings below may reference images not displayed]

FINDINGS: There is a scattered fibroglandular parenchymal pattern
present.  There is an area of distortion located superiorly within
the left breast located at approximately the 12 o'clock position 2-
3 cm superior to the nipple.  In this area of distortion there are
numerous pleomorphic microcalcifications present which on
magnification views are worrisome for possible malignant
calcifications.  In addition, there is linear extension toward the
nipple with inversion of the nipple and calcifications seen
posterior to the nipple which are worrisome for malignant
calcifications within the ductal system  immediately posterior to
the left nipple.  The area of distortion on mammography measures
maximally approximately 5 cm in diameter.  In addition, there are
numerous partially circumscribed nodules present bilaterally within
the central portion of the right breast and also with the upper-
outer quadrant of the left breast.  There is a mildly prominent low
left axillary lymph node present.  However, a central fatty hilum
remains associated with this lymph node.
Mammographic images were processed with CAD.

On physical exam, there is thickening within the superior portion
of the left breast located 2-3 cm superior to the nipple.  The left
nipple is inverted and bloody nipple discharge could be elicited
with compression.

Ultrasound is performed, showing distortion with shadowing located
within the superior left breast at the 12 o'clock position 2 cm
from the nipple.  This is associated with several adjacent
hypoechoic irregular nodules which in composite measure 1.5 cm in
diameter.  In addition, there are calcifications associated with
these nodules.  When measuring both the area of distortion and the
hypoechoic nodules in their entirety,  this measures 2.3 cm in
diameter by ultrasound.  This area is worrisome for carcinoma of
the breast and tissue sampling is recommended.  I have discussed
ultrasound-guided core biopsy of this area with the patient.  She
would like to proceed with this and this will be performed and
reported separately.

Ultrasound of the left axilla demonstrates normal-appearing left
axillary lymph nodes with normally retained fatty hilar regions.
There is no worrisome adenopathy within the left axilla.

Also, ultrasound of the breasts bilaterally demonstrate numerous
bilateral simple cysts accounting for the numerous bilateral
circumscribed nodules noted on mammography.
IMPRESSION: 1.  Distortion with worrisome microcalcifications located within
the left breast at the 12 o'clock position. The findings are
worrisome for possible invasive malignancy with DCIS and extension
to the left nipple (producing bloody left nipple discharge).
2.  No evidence for enlarged left axillary adenopathy.
3.  Bilateral simple appearing cysts.

Tissue sampling of the abnormality within the left breast located
at the 12 o'clock position is recommended and ultrasound-guided
core biopsy will be performed and reported separately.

BI-RADS CATEGORY 5:  Highly suggestive of malignancy - appropriate
action should be taken.

## 2012-07-21 ENCOUNTER — Encounter (HOSPITAL_COMMUNITY): Payer: Medicaid Other | Attending: Oncology | Admitting: Oncology

## 2012-07-21 ENCOUNTER — Encounter (HOSPITAL_BASED_OUTPATIENT_CLINIC_OR_DEPARTMENT_OTHER): Payer: Self-pay | Admitting: *Deleted

## 2012-07-21 VITALS — BP 130/80 | HR 109 | Temp 98.0°F | Resp 22 | Wt 257.7 lb

## 2012-07-21 DIAGNOSIS — D0590 Unspecified type of carcinoma in situ of unspecified breast: Secondary | ICD-10-CM

## 2012-07-21 DIAGNOSIS — D0510 Intraductal carcinoma in situ of unspecified breast: Secondary | ICD-10-CM

## 2012-07-21 DIAGNOSIS — C50919 Malignant neoplasm of unspecified site of unspecified female breast: Secondary | ICD-10-CM

## 2012-07-21 DIAGNOSIS — Z170 Estrogen receptor positive status [ER+]: Secondary | ICD-10-CM

## 2012-07-21 NOTE — Progress Notes (Signed)
Alice Reichert, MD [address] [place] [address]  1. DCIS (ductal carcinoma in situ) of breast   2. Invasive ductal carcinoma of breast     CURRENT THERAPY: On Femara daily beginning on 12/14/2010.  INTERVAL HISTORY: SHAQUEL JOSEPHSON 61 y.o. female returns for  regular  visit for followup of Ductal Carcinoma In Situ. On biopsy, an invasive component was seen with papillary features, but at time of surgery only DCIS was discovered. See path below for more details. On Femara daily. S/P radiation on 10/25/10- 12/13/10. S/P modified radical mastectomy on left.  She started Femara the day after radiation was completed which was on 12/14/2010 and she will take that for 5 years.    Celestine is doing well. She reports that she is due to have eye surgery by Dr. Maple Hudson on Friday.   She reports that she has a weakness in her eye muscle and therefore has double vision.  It is worse when she is resting at home.   I personally reviewed and went over laboratory results with the patient.  Lab work from Feb 2013 is unremarkable.  No lab work today.  She is getting lab work by PCP regularly.  She is tolerating the Femara well.  She will continue with this for 5 years.   She reports that she has lost 10 lbs and she is trying to eat healthier and consume more fluids so she does not crave foods as much.  She weighs 257 lbs today.   Past Medical History  Diagnosis Date  . 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 History of double vision   . Balance problems   . Obesity 01/22/2012    has DCIS (ductal carcinoma in situ) of breast; Invasive ductal carcinoma of breast; and Obesity on her problem list.     is allergic to codeine.  Ms. Laton had no medications administered during this visit.  Past Surgical History  Procedure Date  . Mastectomy, partial     Denies any headaches, dizziness, double vision, fevers, chills, night sweats,  nausea, vomiting, diarrhea, constipation, chest pain, heart palpitations, shortness of breath, blood in stool, black tarry stool, urinary pain, urinary burning, urinary frequency, hematuria.   PHYSICAL EXAMINATION  ECOG PERFORMANCE STATUS: 2 - Symptomatic, <50% confined to bed  Filed Vitals:   07/21/12 0949  BP: 130/80  Pulse: 109  Temp: 98 F (36.7 C)  Resp: 22    GENERAL:alert, no distress, well nourished, well developed, obese and smiling SKIN: skin color, texture, turgor are normal, no rashes or significant lesions HEAD: Normocephalic, No masses, lesions, tenderness or abnormalities EYES: normal, Conjunctiva are pink and non-injected EARS: External ears normal OROPHARYNX:lips, buccal mucosa, and tongue normal and mucous membranes are moist  NECK: supple, trachea midline LYMPH:  no palpable lymphadenopathy, no hepatosplenomegaly BREAST:right breast normal without mass, skin or nipple changes or axillary nodes, left post-mastectomy site well healed and free of suspicious changes LUNGS: clear to auscultation and percussion HEART: regular rate & rhythm, no murmurs, no gallops, S1 normal and S2 normal ABDOMEN:abdomen soft, non-tender, obese, normal bowel sounds, no masses or organomegaly and no hepatosplenomegaly BACK: Back symmetric, no curvature., No CVA tenderness EXTREMITIES:less then 2 second capillary refill, no joint deformities, effusion, or inflammation, no skin discoloration, no clubbing, no cyanosis, positive findings:  edema nonpitting LE edema.   NEURO: alert & oriented x 3 with fluent speech, no focal motor/sensory deficits, gait  normal with aid of cane.     LABORATORY DATA: CBC    Component Value Date/Time   WBC 7.2 01/22/2012 1049   RBC 4.10 01/22/2012 1049   HGB 12.2 01/22/2012 1049   HCT 37.5 01/22/2012 1049   PLT 232 01/22/2012 1049   MCV 91.5 01/22/2012 1049   MCH 29.8 01/22/2012 1049   MCHC 32.5 01/22/2012 1049   RDW 12.8 01/22/2012 1049   LYMPHSABS 1.4 01/22/2012 1049     MONOABS 0.4 01/22/2012 1049   EOSABS 0.1 01/22/2012 1049   BASOSABS 0.0 01/22/2012 1049      Chemistry      Component Value Date/Time   NA 140 01/22/2012 1049   K 3.7 01/22/2012 1049   CL 104 01/22/2012 1049   CO2 26 01/22/2012 1049   BUN 13 01/22/2012 1049   CREATININE 0.65 01/22/2012 1049      Component Value Date/Time   CALCIUM 9.9 01/22/2012 1049   ALKPHOS 58 01/22/2012 1049   AST 17 01/22/2012 1049   ALT 13 01/22/2012 1049   BILITOT 0.4 01/22/2012 1049        PATHOLOGY: 1. Breast, modified radical mastectomy, left and axilla- residual ductal carcinoma in situ, papillary pattern, 5.0 cm. No stromal invasion identified. All final resection margins clear, 0/7 lymph nodes for metastatic disease. Er 100%, PR 100%, Ki-67 16%, Her 2 negative.     ASSESSMENT:  1. DCIS, s/p mastectomy on left. S/p radiation from 10/25/10- 12/13/10. On Femara daily which she is tolerating well.  2. Obesity    PLAN:  1. I personally reviewed and went over laboratory results with the patient. 2. She will undergo eye surgery by Dr. Maple Hudson this Friday. 3. She will then follow-up with PCP and he will help coordinate her next mammogram. 4. Return in 6 months for follow-up.  If no lab work performed near that time, lab work can be performed that day.   All questions were answered. The patient knows to call the clinic with any problems, questions or concerns. We can certainly see the patient much sooner if necessary.  Kawehi Hostetter

## 2012-07-21 NOTE — Progress Notes (Signed)
No labs needed-had labs [facility]/13 [facility]-she denies resp or sleep apnea- Has nobody to stay overnight with her-or come get her in am if she stays in [facility]-she will call dr Roxy Cedar office if she cannot work that out. To bring all meds and overnight bag

## 2012-07-21 NOTE — Patient Instructions (Signed)
[facility]  Discharge Instructions Deanna Henderson  [id number] 1951/03/13 Dr. Glenford Peers  RECOMMENDATIONS MADE BY THE CONSULTANT AND ANY TEST RESULTS WILL BE SENT TO YOUR REFERRING DOCTOR.   EXAM FINDINGS BY MD TODAY AND SIGNS AND SYMPTOMS TO REPORT TO CLINIC OR PRIMARY MD:   ISPECIAL INSTRUCTIONS/FOLLOW-UP: Doing very well.  Call with any new lumps or bumps or bone pain See [place] in 6 months   I acknowledge that I have been informed and understand all the instructions given to me and received a copy. I do not have any more questions at this time, but understand that I may call the Specialty [facility] at [facility] at [phone] during business hours should I have any further questions or need assistance in obtaining follow-up care.    __________________________________________  _____________  __________ Signature of Patient or Authorized Representative            Date                   Time    __________________________________________ Nurse's Signature

## 2012-07-23 NOTE — H&P (Signed)
  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 History of double vision   . Balance problems   . Obesity 01/22/2012  . Wears partial dentures     Pertinent ocular history:  Larey Seat on hardwood floor, hit head 2009 before onset of symptoms.  Tried prism--unsatisfactory  Pertinent family history:  Family History  Problem Relation Age of Onset  . Cancer Mother   . Cancer Father   . Hypertension Sister   . Diabetes Sister   . Cancer Sister     General:  Healthy appearing patient in no distress.    Eyes:    Acuity cc  [place]  OD 20/20  OS 20/20  External: Within normal limits     Anterior segment: Within normal limits   Mod NS OU  Motility:   Cc RHT = 10, increases in L gaze and in downgaze.  Greater on R tilt.  17 deg excyclotorsion by double Maddox rod   Fundus: Post pole normal  Refraction:   Low minus OU   Heart: Regular rate and rhythm without murmur     Lungs: Clear to auscultation     Abdomen: Soft, nontender, normal bowel sounds     Impression: RHT with marked extorsion:  ? Bilateral SO palsy, ? Traumatic, R > L  Plan: RSO tuck, L Artelia Laroche.  Explained may take more than one surgery  Daniyal Tabor O

## 2012-07-24 ENCOUNTER — Ambulatory Visit (HOSPITAL_BASED_OUTPATIENT_CLINIC_OR_DEPARTMENT_OTHER)
Admission: RE | Admit: 2012-07-24 | Discharge: 2012-07-24 | Disposition: A | Discharge status: Home / Self Care | Payer: Medicaid Other | Source: Ambulatory Visit | Attending: Ophthalmology | Admitting: Ophthalmology

## 2012-07-24 ENCOUNTER — Encounter (HOSPITAL_BASED_OUTPATIENT_CLINIC_OR_DEPARTMENT_OTHER): Payer: Self-pay | Admitting: Anesthesiology

## 2012-07-24 ENCOUNTER — Encounter (HOSPITAL_BASED_OUTPATIENT_CLINIC_OR_DEPARTMENT_OTHER): Payer: Self-pay | Admitting: Certified Registered Nurse Anesthetist

## 2012-07-24 ENCOUNTER — Encounter (HOSPITAL_BASED_OUTPATIENT_CLINIC_OR_DEPARTMENT_OTHER)
Admission: RE | Disposition: A | Discharge status: Home / Self Care | Payer: Self-pay | Source: Ambulatory Visit | Attending: Ophthalmology

## 2012-07-24 ENCOUNTER — Encounter (HOSPITAL_BASED_OUTPATIENT_CLINIC_OR_DEPARTMENT_OTHER): Payer: Self-pay

## 2012-07-24 ENCOUNTER — Ambulatory Visit (HOSPITAL_BASED_OUTPATIENT_CLINIC_OR_DEPARTMENT_OTHER): Payer: Medicaid Other | Admitting: Anesthesiology

## 2012-07-24 DIAGNOSIS — IMO0002 Reserved for concepts with insufficient information to code with codable children: Secondary | ICD-10-CM | POA: Insufficient documentation

## 2012-07-24 DIAGNOSIS — D0590 Unspecified type of carcinoma in situ of unspecified breast: Secondary | ICD-10-CM | POA: Insufficient documentation

## 2012-07-24 DIAGNOSIS — E039 Hypothyroidism, unspecified: Secondary | ICD-10-CM | POA: Insufficient documentation

## 2012-07-24 HISTORY — PX: STRABISMUS SURGERY: SHX218

## 2012-07-24 HISTORY — DX: Presence of dental prosthetic device (complete) (partial): Z97.2

## 2012-07-24 SURGERY — STRABISMUS SURGERY, BILATERAL
Anesthesia: General | Site: Eye | Laterality: Bilateral | Wound class: Clean

## 2012-07-24 MED ORDER — ACETAMINOPHEN 10 MG/ML IV SOLN
1000.0000 mg | Freq: Once | INTRAVENOUS | Status: AC
  Administered 2012-07-24: 1000 mg via INTRAVENOUS

## 2012-07-24 MED ORDER — PHENYLEPHRINE HCL 2.5 % OP SOLN
1.0000 [drp] | Freq: Once | OPHTHALMIC | Status: AC
  Administered 2012-07-24 (×2): 1 [drp] via OPHTHALMIC

## 2012-07-24 MED ORDER — FENTANYL CITRATE 0.05 MG/ML IJ SOLN
INTRAMUSCULAR | Status: DC | PRN
  Administered 2012-07-24 (×2): 25 ug via INTRAVENOUS

## 2012-07-24 MED ORDER — LIDOCAINE HCL (CARDIAC) 20 MG/ML IV SOLN
INTRAVENOUS | Status: DC | PRN
  Administered 2012-07-24: 100 mg via INTRAVENOUS

## 2012-07-24 MED ORDER — BSS IO SOLN
INTRAOCULAR | Status: DC | PRN
  Administered 2012-07-24: 5 mL via INTRAOCULAR

## 2012-07-24 MED ORDER — LACTATED RINGERS IV SOLN
INTRAVENOUS | Status: DC
  Administered 2012-07-24 (×2): via INTRAVENOUS

## 2012-07-24 MED ORDER — CYCLOPENTOLATE HCL 1 % OP SOLN
1.0000 [drp] | Freq: Once | OPHTHALMIC | Status: AC
  Administered 2012-07-24 (×2): 1 [drp] via OPHTHALMIC

## 2012-07-24 MED ORDER — KETOROLAC TROMETHAMINE 30 MG/ML IJ SOLN
INTRAMUSCULAR | Status: DC | PRN
  Administered 2012-07-24: 30 mg via INTRAVENOUS

## 2012-07-24 MED ORDER — TOBRAMYCIN-DEXAMETHASONE 0.3-0.1 % OP OINT
TOPICAL_OINTMENT | OPHTHALMIC | Status: DC | PRN
  Administered 2012-07-24: 1 via OPHTHALMIC

## 2012-07-24 MED ORDER — TROPICAMIDE 1 % OP SOLN: 1.0000 [drp] | Freq: Once | OPHTHALMIC | Status: DC

## 2012-07-24 MED ORDER — TOBRAMYCIN-DEXAMETHASONE 0.3-0.1 % OP OINT: TOPICAL_OINTMENT | Freq: Two times a day (BID) | OPHTHALMIC | Status: AC

## 2012-07-24 MED ORDER — PROPOFOL 10 MG/ML IV EMUL
INTRAVENOUS | Status: DC | PRN
  Administered 2012-07-24: 50 mg via INTRAVENOUS
  Administered 2012-07-24: 250 mg via INTRAVENOUS

## 2012-07-24 MED ORDER — DEXAMETHASONE SODIUM PHOSPHATE 4 MG/ML IJ SOLN
INTRAMUSCULAR | Status: DC | PRN
  Administered 2012-07-24: 10 mg via INTRAVENOUS

## 2012-07-24 MED ORDER — SCOPOLAMINE 1 MG/3DAYS TD PT72
1.0000 | MEDICATED_PATCH | Freq: Once | TRANSDERMAL | Status: DC
  Administered 2012-07-24: 1.5 mg via TRANSDERMAL

## 2012-07-24 MED ORDER — MIDAZOLAM HCL 5 MG/5ML IJ SOLN
INTRAMUSCULAR | Status: DC | PRN
  Administered 2012-07-24: 2 mg via INTRAVENOUS

## 2012-07-24 MED ORDER — ONDANSETRON HCL 4 MG/2ML IJ SOLN
INTRAMUSCULAR | Status: DC | PRN
  Administered 2012-07-24: 4 mg via INTRAVENOUS

## 2012-07-24 MED ORDER — OXYCODONE-ACETAMINOPHEN 7.5-325 MG PO TABS: 1.0000 | ORAL_TABLET | ORAL | Status: AC | PRN

## 2012-07-24 SURGICAL SUPPLY — 29 items
APPLICATOR COTTON TIP 6IN STRL (MISCELLANEOUS) ×8 IMPLANT
APPLICATOR DR MATTHEWS STRL (MISCELLANEOUS) ×2 IMPLANT
CAUTERY EYE LOW TEMP 1300F FIN (OPHTHALMIC RELATED) IMPLANT
CLOTH BEACON ORANGE TIMEOUT ST (SAFETY) ×2 IMPLANT
COVER MAYO STAND STRL (DRAPES) ×2 IMPLANT
COVER TABLE BACK 60X90 (DRAPES) ×2 IMPLANT
DRAPE SURG 17X23 STRL (DRAPES) ×4 IMPLANT
DRAPE U-SHAPE 76X120 STRL (DRAPES) IMPLANT
GLOVE BIO SURGEON STRL SZ 6.5 (GLOVE) ×2 IMPLANT
GLOVE BIOGEL M STRL SZ7.5 (GLOVE) ×4 IMPLANT
GOWN BRE IMP PREV XXLGXLNG (GOWN DISPOSABLE) ×4 IMPLANT
GOWN PREVENTION PLUS XLARGE (GOWN DISPOSABLE) ×2 IMPLANT
NS IRRIG 1000ML POUR BTL (IV SOLUTION) ×2 IMPLANT
PACK BASIN DAY SURGERY FS (CUSTOM PROCEDURE TRAY) ×2 IMPLANT
PAD EYE OVAL STERILE LF (GAUZE/BANDAGES/DRESSINGS) ×4 IMPLANT
SHEET MEDIUM DRAPE 40X70 STRL (DRAPES) IMPLANT
SPEAR EYE SURG WECK-CEL (MISCELLANEOUS) ×4 IMPLANT
STRIP CLOSURE SKIN 1/4X4 (GAUZE/BANDAGES/DRESSINGS) IMPLANT
SUT 6 0 SILK T G140 8DA (SUTURE) IMPLANT
SUT MERSILENE 6 0 S14 DA (SUTURE) ×4 IMPLANT
SUT PLAIN 6 0 TG1408 (SUTURE) ×2 IMPLANT
SUT SILK 4 0 C 3 735G (SUTURE) IMPLANT
SUT VICRYL 6 0 S 28 (SUTURE) IMPLANT
SUT VICRYL ABS 6-0 S29 18IN (SUTURE) IMPLANT
SYRINGE 10CC LL (SYRINGE) ×2 IMPLANT
TOWEL OR 17X24 6PK STRL BLUE (TOWEL DISPOSABLE) ×2 IMPLANT
TOWEL OR NON WOVEN STRL DISP B (DISPOSABLE) ×2 IMPLANT
TRAY DSU PREP LF (CUSTOM PROCEDURE TRAY) ×2 IMPLANT
WATER STERILE IRR 1000ML POUR (IV SOLUTION) ×2 IMPLANT

## 2012-07-24 NOTE — Op Note (Signed)
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 Maple Hudson, M.D.  Anesthesia: Gen. (laryngeal mask)  Complications: None  Description of procedure: After routine preoperative evaluation including informed consent, the patient was taken to the operating room where she was identified by me. Note that the pupils were dilated pharmacologically at my request in the preoperative area.  General anesthesia was induced without difficulty after placement of appropriate monitors. The fundi were examined by indirect ophthalmoscopy. Fundus extorsion was moderate in the right eye but marked in the left eye.  Through a superonasal fornix incision through conjunctiva and Tenon fascia, the right superior rectus muscle was engaged on a series of muscle hooks. A Desmarres retractor was introduced into the conjunctival incision and drawn posteriorly, exposing the tendon of the superior oblique along the nasal border of the superior rectus muscle. The tendon was engaged with oblique hook and drawn forward, taking care to be sure that all fibers had been engaged. The tendon was engaged on a Bishop tendon Pricilla Holm which was drawn up to a level of 5 mm (each limb). The 2 limbs of the superior oblique tendon were imbricated with a 6-0 Mersilene suture in horizontal mattress fashion first anteriorly to posteriorly then posteriorly to anteriorly. The suture ends were tied securely, then wrapped twice around the combined tendon and tied once more. The Pricilla Holm was removed. Conjunctiva was closed with 2 6-0 plain gut sutures.  The lid speculum was transferred to the left eye. Through a superotemporal fornix incision through conjunctiva and tenons fascia, the left superior rectus muscle was engaged on a  series of muscle hooks. A Desmarres retractor was introduced into the conjunctival incision and drawn posteriorly, exposing the insertion of the superior oblique tendon under the temporal border of the superior rectus muscle. The tendon was engaged on oblique hook the the anterior third of the insertion was separated from the posterior two thirds using oblique hook, which was drawn proximally to split the tendon to at least 10 mm from its insertion to the anterior third of the insertion was grasped with a fine hemostat and disinserted. The cut end of the anterior third of the tendon was secured with a double-armed 6-0 Mersilene suture. The left lateral rectus muscle was engaged on a series of muscle hooks. A mark was made on sclera 8 mm posterior and 2 mm superior to the superior border of the lateral rectus insertion. The anterior third of the superior oblique tendon was reattached to sclera at this location, using direct scleral passes in crossed swords fashion. The suture ends were tied securely. Conjunctiva was closed with 2 6-0 plain gut sutures. Indirect ophthalmoscopy was repeated, and now showed essentially no torsion in either eye. TobraDex ointment was placed in each eye. The patient was awakened without difficulty and taken to the recovery room in stable condition, having suffered no intraoperative or immediate postoperative complications.  Pasty Spillers. Maple Hudson, M.D.

## 2012-07-24 NOTE — Anesthesia Preprocedure Evaluation (Signed)
Anesthesia Evaluation  Patient identified by MRN, date of birth, ID band Patient awake    Reviewed: Allergy & Precautions, H&P , NPO status , Patient's Chart, lab work & pertinent test results, reviewed documented beta blocker date and time   Airway Mallampati: II TM Distance: >3 FB Neck ROM: full    Dental   Pulmonary neg pulmonary ROS,  breath sounds clear to auscultation        Cardiovascular negative cardio ROS  Rhythm:regular     Neuro/Psych negative neurological ROS  negative psych ROS   GI/Hepatic negative GI ROS, Neg liver ROS,   Endo/Other  Hypothyroidism Morbid obesity  Renal/GU negative Renal ROS  negative genitourinary   Musculoskeletal   Abdominal   Peds  Hematology negative hematology ROS (+)   Anesthesia Other Findings See surgeon's H&P   Reproductive/Obstetrics negative OB ROS                           Anesthesia Physical Anesthesia Plan  ASA: III  Anesthesia Plan: General   Post-op Pain Management:    Induction: Intravenous  Airway Management Planned: LMA  Additional Equipment:   Intra-op Plan:   Post-operative Plan: Extubation in OR  Informed Consent: I have reviewed the patients History and Physical, chart, labs and discussed the procedure including the risks, benefits and alternatives for the proposed anesthesia with the patient or authorized representative who has indicated his/her understanding and acceptance.   Dental Advisory Given  Plan Discussed with: CRNA and Surgeon  Anesthesia Plan Comments:         Anesthesia Quick Evaluation

## 2012-07-24 NOTE — Anesthesia Procedure Notes (Signed)
Procedure Name: LMA Insertion Date/Time: 07/24/2012 11:36 AM Performed by: Burna Cash Pre-anesthesia Checklist: Patient identified, Emergency Drugs available, Suction available and Patient being monitored Patient Re-evaluated:Patient Re-evaluated prior to inductionOxygen Delivery Method: Circle System Utilized Preoxygenation: Pre-oxygenation with 100% oxygen Intubation Type: IV induction Ventilation: Mask ventilation without difficulty LMA: LMA flexible inserted LMA Size: 4.0 Number of attempts: 1 Airway Equipment and Method: bite block Placement Confirmation: positive ETCO2 Tube secured with: Tape Dental Injury: Teeth and Oropharynx as per pre-operative assessment

## 2012-07-24 NOTE — Interval H&P Note (Signed)
History and Physical Interval Note:  07/24/2012 10:07 AM  Deanna Henderson  has presented today for surgery, with the diagnosis of 4th nerve palsy  The various methods of treatment have been discussed with the patient and family. After consideration of risks, benefits and other options for treatment, the patient has consented to  Procedure(s) (LRB): REPAIR STRABISMUS BILATERAL (Bilateral) as a surgical intervention .  The patient's history has been reviewed, patient examined, no change in status, stable for surgery.  I have reviewed the patient's chart and labs.  Questions were answered to the patient's satisfaction.     Shara Blazing

## 2012-07-24 NOTE — Brief Op Note (Signed)
07/24/2012  11:42 AM  PATIENT:  Deanna Henderson  61 y.o. female  PRE-OPERATIVE DIAGNOSIS:  4th nerve palsy  POST-OPERATIVE DIAGNOSIS:  4th nerve palsy  PROCEDURE:  Procedure(s) (LRB): REPAIR STRABISMUS BILATERAL (Bilateral)  SURGEON:  Surgeon(s) and Role:    * Shara Blazing, MD - Primary  PHYSICIAN ASSISTANT:   ASSISTANTS: none   ANESTHESIA:   general  EBL:  Total I/O In: 1000 [I.V.:1000] Out: -   BLOOD ADMINISTERED:none  DRAINS: none   LOCAL MEDICATIONS USED:  NONE  SPECIMEN:  No Specimen  DISPOSITION OF SPECIMEN:  N/A  COUNTS:  YES  TOURNIQUET:  * No tourniquets in log *  DICTATION: .Note written in EPIC  PLAN OF CARE: Discharge to home after PACU  PATIENT DISPOSITION:  PACU - hemodynamically stable.   Delay start of Pharmacological VTE agent (>24hrs) due to surgical blood loss or risk of bleeding: not applicable

## 2012-07-24 NOTE — Transfer of Care (Signed)
Immediate Anesthesia Transfer of Care Note  Patient: Deanna Henderson  Procedure(s) Performed: Procedure(s) (LRB): REPAIR STRABISMUS BILATERAL (Bilateral)  Patient Location: PACU  Anesthesia Type: General  Level of Consciousness: awake, alert , oriented and patient cooperative  Airway & Oxygen Therapy: Patient Spontanous Breathing and Patient connected to face mask oxygen  Post-op Assessment: Report given to PACU RN and Post -op Vital signs reviewed and stable  Post vital signs: Reviewed and stable  Complications: No apparent anesthesia complications

## 2012-07-24 NOTE — H&P (View-Only) (Signed)
Deanna G, MD [address] [place] [address]  1. DCIS (ductal carcinoma in situ) of breast   2. Invasive ductal carcinoma of breast     CURRENT THERAPY: On Femara daily beginning on 12/14/2010.  INTERVAL HISTORY: Deanna Henderson 61 y.o. female returns for  regular  visit for followup of Ductal Carcinoma In Situ. On biopsy, an invasive component was seen with papillary features, but at time of surgery only DCIS was discovered. See path below for more details. On Femara daily. S/P radiation on 10/25/10- 12/13/10. S/P modified radical mastectomy on left.  She started Femara the day after radiation was completed which was on 12/14/2010 and she will take that for 5 years.    Deanna Henderson is doing well. She reports that she is due to have eye surgery by Dr. Young on Friday.   She reports that she has a weakness in her eye muscle and therefore has double vision.  It is worse when she is resting at home.   I personally reviewed and went over laboratory results with the patient.  Lab work from Feb 2013 is unremarkable.  No lab work today.  She is getting lab work by PCP regularly.  She is tolerating the Femara well.  She will continue with this for 5 years.   She reports that she has lost 10 lbs and she is trying to eat healthier and consume more fluids so she does not crave foods as much.  She weighs 257 lbs today.   Past Medical History  Diagnosis Date  . 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 History of double vision   . Balance problems   . Obesity 01/22/2012    has DCIS (ductal carcinoma in situ) of breast; Invasive ductal carcinoma of breast; and Obesity on her problem list.     is allergic to codeine.  [name] had no medications administered during this visit.  Past Surgical History  Procedure Date  . Mastectomy, partial     Denies any headaches, dizziness, double vision, fevers, chills, night sweats,  nausea, vomiting, diarrhea, constipation, chest pain, heart palpitations, shortness of breath, blood in stool, black tarry stool, urinary pain, urinary burning, urinary frequency, hematuria.   PHYSICAL EXAMINATION  ECOG PERFORMANCE STATUS: 2 - Symptomatic, <50% confined to bed  Filed Vitals:   07/21/12 0949  BP: 130/80  Pulse: 109  Temp: 98 F (36.7 C)  Resp: 22    GENERAL:alert, no distress, well nourished, well developed, obese and smiling SKIN: skin color, texture, turgor are normal, no rashes or significant lesions HEAD: Normocephalic, No masses, lesions, tenderness or abnormalities EYES: normal, Conjunctiva are pink and non-injected EARS: External ears normal OROPHARYNX:lips, buccal mucosa, and tongue normal and mucous membranes are moist  NECK: supple, trachea midline LYMPH:  no palpable lymphadenopathy, no hepatosplenomegaly BREAST:right breast normal without mass, skin or nipple changes or axillary nodes, left post-mastectomy site well healed and free of suspicious changes LUNGS: clear to auscultation and percussion HEART: regular rate & rhythm, no murmurs, no gallops, S1 normal and S2 normal ABDOMEN:abdomen soft, non-tender, obese, normal bowel sounds, no masses or organomegaly and no hepatosplenomegaly BACK: Back symmetric, no curvature., No CVA tenderness EXTREMITIES:less then 2 second capillary refill, no joint deformities, effusion, or inflammation, no skin discoloration, no clubbing, no cyanosis, positive findings:  edema nonpitting LE edema.   NEURO: alert & oriented x 3 with fluent speech, no focal motor/sensory deficits, gait   normal with aid of cane.     LABORATORY DATA: CBC    Component Value Date/Time   WBC 7.2 01/22/2012 1049   RBC 4.10 01/22/2012 1049   HGB 12.2 01/22/2012 1049   HCT 37.5 01/22/2012 1049   PLT 232 01/22/2012 1049   MCV 91.5 01/22/2012 1049   MCH 29.8 01/22/2012 1049   MCHC 32.5 01/22/2012 1049   RDW 12.8 01/22/2012 1049   LYMPHSABS 1.4 01/22/2012 1049     MONOABS 0.4 01/22/2012 1049   EOSABS 0.1 01/22/2012 1049   BASOSABS 0.0 01/22/2012 1049      Chemistry      Component Value Date/Time   NA 140 01/22/2012 1049   K 3.7 01/22/2012 1049   CL 104 01/22/2012 1049   CO2 26 01/22/2012 1049   BUN 13 01/22/2012 1049   CREATININE 0.65 01/22/2012 1049      Component Value Date/Time   CALCIUM 9.9 01/22/2012 1049   ALKPHOS 58 01/22/2012 1049   AST 17 01/22/2012 1049   ALT 13 01/22/2012 1049   BILITOT 0.4 01/22/2012 1049        PATHOLOGY: 1. Breast, modified radical mastectomy, left and axilla- residual ductal carcinoma in situ, papillary pattern, 5.0 cm. No stromal invasion identified. All final resection margins clear, 0/7 lymph nodes for metastatic disease. Er 100%, PR 100%, Ki-67 16%, Her 2 negative.     ASSESSMENT:  1. DCIS, s/p mastectomy on left. S/p radiation from 10/25/10- 12/13/10. On Femara daily which she is tolerating well.  2. Obesity    PLAN:  1. I personally reviewed and went over laboratory results with the patient. 2. She will undergo eye surgery by Dr. Young this Friday. 3. She will then follow-up with PCP and he will help coordinate her next mammogram. 4. Return in 6 months for follow-up.  If no lab work performed near that time, lab work can be performed that day.   All questions were answered. The patient knows to call the clinic with any problems, questions or concerns. We can certainly see the patient much sooner if necessary.  Jann Ra   

## 2012-07-24 NOTE — Anesthesia Postprocedure Evaluation (Signed)
Anesthesia Post Note  Patient: Deanna Henderson  Procedure(s) Performed: Procedure(s) (LRB): REPAIR STRABISMUS BILATERAL (Bilateral)  Anesthesia type: General  Patient location: PACU  Post pain: Pain level controlled  Post assessment: Patient's Cardiovascular Status Stable  Last Vitals:  Filed Vitals:   07/24/12 1255  BP: 134/74  Pulse: 68  Temp: 36.8 C  Resp: 16    Post vital signs: Reviewed and stable  Level of consciousness: alert  Complications: No apparent anesthesia complications

## 2012-07-27 ENCOUNTER — Encounter (HOSPITAL_BASED_OUTPATIENT_CLINIC_OR_DEPARTMENT_OTHER): Payer: Self-pay | Admitting: Ophthalmology

## 2012-09-01 ENCOUNTER — Other Ambulatory Visit (HOSPITAL_COMMUNITY): Payer: Self-pay | Admitting: Family Medicine

## 2012-09-01 DIAGNOSIS — Z139 Encounter for screening, unspecified: Secondary | ICD-10-CM

## 2012-09-08 ENCOUNTER — Ambulatory Visit (HOSPITAL_COMMUNITY)
Admission: RE | Admit: 2012-09-08 | Discharge: 2012-09-08 | Disposition: A | Discharge status: Home / Self Care | Payer: Medicaid Other | Source: Ambulatory Visit | Attending: Family Medicine | Admitting: Family Medicine

## 2012-09-08 DIAGNOSIS — Z139 Encounter for screening, unspecified: Secondary | ICD-10-CM

## 2012-09-08 DIAGNOSIS — Z1231 Encounter for screening mammogram for malignant neoplasm of breast: Secondary | ICD-10-CM | POA: Insufficient documentation

## 2013-01-20 ENCOUNTER — Encounter (HOSPITAL_COMMUNITY): Payer: Self-pay | Admitting: Oncology

## 2013-01-20 ENCOUNTER — Encounter (HOSPITAL_COMMUNITY): Payer: Medicaid Other | Attending: Oncology | Admitting: Oncology

## 2013-01-20 VITALS — BP 149/86 | HR 104 | Temp 97.5°F | Resp 22 | Wt 267.9 lb

## 2013-01-20 DIAGNOSIS — E039 Hypothyroidism, unspecified: Secondary | ICD-10-CM | POA: Insufficient documentation

## 2013-01-20 DIAGNOSIS — Z853 Personal history of malignant neoplasm of breast: Secondary | ICD-10-CM | POA: Insufficient documentation

## 2013-01-20 DIAGNOSIS — E7800 Pure hypercholesterolemia, unspecified: Secondary | ICD-10-CM

## 2013-01-20 DIAGNOSIS — D0590 Unspecified type of carcinoma in situ of unspecified breast: Secondary | ICD-10-CM

## 2013-01-20 DIAGNOSIS — Z09 Encounter for follow-up examination after completed treatment for conditions other than malignant neoplasm: Secondary | ICD-10-CM | POA: Insufficient documentation

## 2013-01-20 DIAGNOSIS — C50919 Malignant neoplasm of unspecified site of unspecified female breast: Secondary | ICD-10-CM

## 2013-01-20 LAB — CBC WITH DIFFERENTIAL/PLATELET
Basophils Absolute: 0 10*3/uL (ref 0.0–0.1)
Eosinophils Absolute: 0.1 10*3/uL (ref 0.0–0.7)
Eosinophils Relative: 1 % (ref 0–5)
MCH: 30.6 pg (ref 26.0–34.0)
MCV: 91.4 fL (ref 78.0–100.0)
Platelets: 256 10*3/uL (ref 150–400)
RDW: 12.9 % (ref 11.5–15.5)

## 2013-01-20 LAB — COMPREHENSIVE METABOLIC PANEL
ALT: 14 U/L (ref 0–35)
AST: 18 U/L (ref 0–37)
Calcium: 10 mg/dL (ref 8.4–10.5)
GFR calc Af Amer: >90 mL/min (ref >90)
Glucose, Bld: 98 mg/dL (ref 70–99)
Sodium: 139 mEq/L (ref 135–145)
Total Protein: 7.7 g/dL (ref 6.0–8.3)

## 2013-01-20 MED ORDER — LETROZOLE 2.5 MG PO TABS: 2.5000 mg | ORAL_TABLET | Freq: Every day | ORAL | Status: DC

## 2013-01-20 NOTE — Progress Notes (Signed)
Problem #1 DCIS of the left breast status post mastectomy with negative nodes (0/7) on 08/27/2010 after initial biopsy on 03/21/2010 showed possible invasive carcinoma the breast with papillary features. She wanted an attempt to preserve her breast thereby receiving adjuvant hormonal (letrozole) therapy for 4 months prior to definitive surgery on the date mentioned above. 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 Once again all 7 lymph nodes were negative. The DCIS was 5.0 cm in size. She completed postoperative radiation therapy on 12/13/2010, then she was placed back on her letrozole and will take that for 5 years. She tolerates the letrozole without difficulty or side effects. Her ER receptors were 100%, ER receptors 100%, HER-2/neu was not amplified on the original biopsy specimen. Problem #2 recent eye surgery bilaterally for muscle weakness, etc. with excellent results Problem #3 obesity, morbid Problem #4 hypothyroidism on replacement Synthroid Problem #5 memory issues on Aricept Problem #6 hypercholesterolemia on simvastatin  Presently she is still living by herself and doing well with the letrozole. The eye surgery in September or August 2013 went very well she states. She cannot remember the exact date of surgery.  She continues to have no oncology positive review of systems.  BP 149/86  Pulse 104  Temp 97.5 F (36.4 C) (Oral)  Resp 22  Wt 267 lb 14.4 oz (121.519 kg)  She remains very overweight still for her height. She is using a cane. She has no lymphadenopathy in the cervical, supraclavicular, infraclavicular, axillary or inguinal areas. Bowel sounds are diminished. She has a very obese abdomen. Lungs are clear however. The left chest wall is negative. The right breast is negative for masses. Heart shows a regular rhythm and rate without murmur rub or gallop. She has no peripheral edema of the legs or arms.  We will see her back in 6 months,  continue the letrozole, and we will see her sooner if need be.

## 2013-01-20 NOTE — Patient Instructions (Addendum)
[facility] Discharge Instructions  RECOMMENDATIONS MADE BY THE CONSULTANT AND ANY TEST RESULTS WILL BE SENT TO YOUR REFERRING PHYSICIAN.  EXAM FINDINGS BY THE PHYSICIAN TODAY AND SIGNS OR SYMPTOMS TO REPORT TO CLINIC OR PRIMARY PHYSICIAN: Exam and discussion by MD.  Bonita Quin are doing well.  No evidence of recurrence by exam.  MEDICATIONS PRESCRIBED:  Refills for Femara e-scribed to your pharmacy.  INSTRUCTIONS GIVEN AND DISCUSSED: Report any new lumps, bone pain, shortness of breath or other symptoms.   SPECIAL INSTRUCTIONS/FOLLOW-UP: Blood work today and Return for follow-up in 6 months.  Thank you for choosing [facility] to provide your oncology and hematology care.  To afford each patient quality time with our providers, please arrive at least 15 minutes before your scheduled appointment time.  With your help, our goal is to use those 15 minutes to complete the necessary work-up to ensure our physicians have the information they need to help with your evaluation and healthcare recommendations.    Effective January 1st, 2014, we ask that you re-schedule your appointment with our physicians should you arrive 10 or more minutes late for your appointment.  We strive to give you quality time with our providers, and arriving late affects you and other patients whose appointments are after yours.    Again, thank you for choosing [facility].  Our hope is that these requests will decrease the amount of time that you wait before being seen by our physicians.       _____________________________________________________________  Should you have questions after your visit to [facility], please contact our office at [phone] between the hours of 8:30 a.m. and 5:00 p.m.  Voicemails left after 4:30 p.m. will not be returned until the following business day.  For prescription refill requests, have your pharmacy contact our office with your  prescription refill request.

## 2013-07-20 ENCOUNTER — Encounter (HOSPITAL_COMMUNITY): Payer: Medicaid Other | Attending: Oncology | Admitting: Oncology

## 2013-07-20 ENCOUNTER — Encounter (HOSPITAL_COMMUNITY): Payer: Self-pay | Admitting: Oncology

## 2013-07-20 VITALS — BP 113/76 | HR 82 | Temp 98.8°F | Resp 20 | Wt 259.4 lb

## 2013-07-20 DIAGNOSIS — C50919 Malignant neoplasm of unspecified site of unspecified female breast: Secondary | ICD-10-CM

## 2013-07-20 DIAGNOSIS — D0590 Unspecified type of carcinoma in situ of unspecified breast: Secondary | ICD-10-CM

## 2013-07-20 DIAGNOSIS — D0510 Intraductal carcinoma in situ of unspecified breast: Secondary | ICD-10-CM

## 2013-07-20 DIAGNOSIS — E669 Obesity, unspecified: Secondary | ICD-10-CM

## 2013-07-20 DIAGNOSIS — Z170 Estrogen receptor positive status [ER+]: Secondary | ICD-10-CM

## 2013-07-20 NOTE — Progress Notes (Signed)
Deanna Reichert, MD [address] [place] [address]  Invasive ductal carcinoma of breast, unspecified laterality - Plan: DG Bone Density  DCIS (ductal carcinoma in situ) of breast, unspecified laterality - Plan: CBC with Differential, Comprehensive metabolic panel, DG Bone Density, DG Bone Density  Obesity  CURRENT THERAPY: Letrozole daily beginning on 12/14/2010 and she will take this medication for 5 years.  INTERVAL HISTORY: Deanna Henderson 62 y.o. 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 She wanted an attempt to preserve her breast thereby receiving adjuvant hormonal (letrozole) therapy for 4 months prior to definitive surgery on the date mentioned above. 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 Once again all 7 lymph nodes were negative. The DCIS was 5.0 cm in size. She completed postoperative radiation therapy on 12/13/2010, then she was placed back on her letrozole and will take that for 5 years. She tolerates the letrozole without difficulty or side effects. Her ER receptors were 100%, ER receptors 100%, HER-2/neu was not amplified on the original biopsy specimen.   Antia is doing well.  She continues to tolerate Letrozole without difficulties or side effects.    Her chart is reviewed.   I personally reviewed and went over laboratory results with the patient.  Her labs from Feb 2014 shows a CBC WNL and an unimpressive metabolic panel.    I personally reviewed and went over radiographic studies with the patient.  Her mammogram is nearly due next month.  Her last mammogram was negative for worrisome findings.  Her bone density performed in 2011 was normal.  Since she is on an aromatase inhibitor, she will be due for a subsequent bone  density examination this year.   Oncologically, she denies any complaints and ROS questioning is negative.   Past Medical History  Diagnosis Date  . 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 History of double vision   . Balance problems   . Obesity 01/22/2012  . Wears partial dentures     has DCIS (ductal carcinoma in situ) of breast; Invasive ductal carcinoma of breast; and Obesity on her problem list.     is allergic to codeine.  Ms. Fowers does not currently have medications on file.  Past Surgical History  Procedure Laterality Date  . Multiple tooth extractions    . Mastectomy, partial  2011    lt mod radical mastectomy  . Strabismus surgery  07/24/2012    Procedure: REPAIR STRABISMUS BILATERAL;  Surgeon: Shara Blazing, MD;  Location: [facility];  Service: Ophthalmology;  Laterality: Bilateral;    Denies any headaches, dizziness, double vision, fevers, chills, night sweats, nausea, vomiting, diarrhea, constipation, chest pain, heart palpitations, shortness of breath, blood in stool, black tarry stool, urinary pain, urinary burning, urinary frequency, hematuria.   PHYSICAL EXAMINATION  ECOG PERFORMANCE STATUS: 1 - Symptomatic but completely ambulatory  Filed Vitals:   07/20/13 1029  BP: 113/76  Pulse: 82  Temp: 98.8 F (37.1 C)  Resp: 20    GENERAL:alert, no distress, well nourished, well developed, comfortable, cooperative, obese, smiling and flat effect SKIN: skin color, texture, turgor are normal, no rashes or significant lesions HEAD: Normocephalic, No masses, lesions, tenderness or abnormalities  EYES: normal, PERRLA, EOMI, Conjunctiva are pink and non-injected EARS: External ears normal OROPHARYNX:mucous membranes are moist  NECK: supple, no adenopathy, thyroid normal size, non-tender, without nodularity, no stridor, non-tender, trachea midline LYMPH:  no palpable lymphadenopathy, no  hepatosplenomegaly BREAST:right breast normal without mass, skin or nipple changes or axillary nodes, left post-mastectomy site well healed and free of suspicious changes LUNGS: clear to auscultation and percussion HEART: regular rate & rhythm, no murmurs, no gallops, S1 normal and S2 normal ABDOMEN:abdomen soft, non-tender, obese, normal bowel sounds, no masses or organomegaly and no hepatosplenomegaly BACK: Back symmetric, no curvature., No CVA tenderness EXTREMITIES:less then 2 second capillary refill, no joint deformities, effusion, or inflammation, no skin discoloration, no clubbing, no cyanosis, positive findings:  edema B/L non-pitting edema.  NEURO: alert & oriented x 3 with fluent speech, no focal motor/sensory deficits   LABORATORY DATA: CBC    Component Value Date/Time   WBC 6.4 01/20/2013 1053   RBC 4.32 01/20/2013 1053   HGB 13.2 01/20/2013 1053   HCT 39.5 01/20/2013 1053   PLT 256 01/20/2013 1053   MCV 91.4 01/20/2013 1053   MCH 30.6 01/20/2013 1053   MCHC 33.4 01/20/2013 1053   RDW 12.9 01/20/2013 1053   LYMPHSABS 1.4 01/20/2013 1053   MONOABS 0.4 01/20/2013 1053   EOSABS 0.1 01/20/2013 1053   BASOSABS 0.0 01/20/2013 1053      Chemistry      Component Value Date/Time   NA 139 01/20/2013 1053   K 4.0 01/20/2013 1053   CL 102 01/20/2013 1053   CO2 26 01/20/2013 1053   BUN 16 01/20/2013 1053   CREATININE 0.74 01/20/2013 1053      Component Value Date/Time   CALCIUM 10.0 01/20/2013 1053   ALKPHOS 63 01/20/2013 1053   AST 18 01/20/2013 1053   ALT 14 01/20/2013 1053   BILITOT 0.4 01/20/2013 1053        RADIOGRAPHIC STUDIES:  09/09/2012  *RADIOLOGY REPORT*  Clinical Data: Screening.  RIGHT DIGITAL SCREENING MAMMOGRAM WITH CAD  Comparison: None.  Findings: The patient has had a left mastectomy. Views of the  right breast demonstrate heterogeneously dense tissue. No  suspicious masses, architectural distortion, or calcifications are  present.  Images were processed with CAD.  IMPRESSION:  No  evidence of malignancy. Screening mammography is recommended in  one year.  BI-RADS CATEGORY 1: Negative.  Original Report Authenticated By: Rolla Plate, M.D.    PATHOLOGY: 1. Breast, modified radical mastectomy, left and axilla- residual ductal carcinoma in situ, papillary pattern, 5.0 cm. No stromal invasion identified. All final resection margins clear, 0/7 lymph nodes for metastatic disease. Er 100%, PR 100%, Ki-67 16%, Her 2 negative.     ASSESSMENT:  1. 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 She wanted an attempt to preserve her breast thereby receiving adjuvant hormonal (letrozole) therapy for 4 months prior to definitive surgery on the date mentioned above. 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 Once again all 7 lymph nodes were negative. The DCIS was 5.0 cm in size. She completed postoperative radiation therapy on 12/13/2010, then she was placed back on her letrozole and will take that for 5 years. She tolerates the letrozole without difficulty or side effects. Her ER receptors were 100%, ER receptors 100%, HER-2/neu was not amplified on the original biopsy specimen. 2. Obesity  Patient Active Problem List   Diagnosis Date Noted  . Obesity 01/22/2012  . 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 Invasive ductal carcinoma of breast 07/22/2011     PLAN:  1. I personally reviewed and went over laboratory results with the patient. 2. I personally reviewed and went over radiographic studies with the patient. 3. Next Screening mammogram is due in September 2014.  The patient was reminded of this screening test. 4. Bone density examination in the next 4 weeks. 5. Labs in 6 months: CBC diff, CMET 6. Return in 6 months for follow-up    THERAPY PLAN:  She is to continue with  daily Letrozole and she will complete 5 years worth of therapy finishing in December 2016.  We will monitor her bone density while on therapy and she was reminded of her yearly screening mammogram.  All questions were answered. The patient knows to call the clinic with any problems, questions or concerns. We can certainly see the patient much sooner if necessary.  Patient and plan discussed with Dr. Gerarda Fraction and he is in agreement with the aforementioned.  Dashiell Franchino

## 2013-07-20 NOTE — Patient Instructions (Addendum)
.  [facility] Discharge Instructions  RECOMMENDATIONS MADE BY THE CONSULTANT AND ANY TEST RESULTS WILL BE SENT TO YOUR REFERRING PHYSICIAN.  EXAM FINDINGS BY THE PHYSICIAN TODAY AND SIGNS OR SYMPTOMS TO REPORT TO CLINIC OR PRIMARY PHYSICIAN: Exam today per Tom  MEDICATIONS PRESCRIBED:  One and a half more years of letrozole  INSTRUCTIONS GIVEN AND DISCUSSED: Bone density in early september  SPECIAL INSTRUCTIONS/FOLLOW-UP: 6 months with labs and see the PA 12 months to see Elijah Birk  Thank you for choosing [facility] to provide your oncology and hematology care.  To afford each patient quality time with our providers, please arrive at least 15 minutes before your scheduled appointment time.  With your help, our goal is to use those 15 minutes to complete the necessary work-up to ensure our physicians have the information they need to help with your evaluation and healthcare recommendations.    Effective January 1st, 2014, we ask that you re-schedule your appointment with our physicians should you arrive 10 or more minutes late for your appointment.  We strive to give you quality time with our providers, and arriving late affects you and other patients whose appointments are after yours.    Again, thank you for choosing [facility].  Our hope is that these requests will decrease the amount of time that you wait before being seen by our physicians.       _____________________________________________________________  Should you have questions after your visit to [facility], please contact our office at [phone] between the hours of 8:30 a.m. and 5:00 p.m.  Voicemails left after 4:30 p.m. will not be returned until the following business day.  For prescription refill requests, have your pharmacy contact our office with your prescription refill request.

## 2013-08-25 ENCOUNTER — Other Ambulatory Visit (HOSPITAL_COMMUNITY): Payer: Self-pay | Admitting: Family Medicine

## 2013-08-25 DIAGNOSIS — Z139 Encounter for screening, unspecified: Secondary | ICD-10-CM

## 2013-08-31 ENCOUNTER — Other Ambulatory Visit (HOSPITAL_COMMUNITY): Payer: Self-pay | Admitting: Oncology

## 2013-09-13 ENCOUNTER — Ambulatory Visit (HOSPITAL_COMMUNITY)
Admission: RE | Admit: 2013-09-13 | Discharge: 2013-09-13 | Disposition: A | Discharge status: Home / Self Care | Payer: Medicaid Other | Source: Ambulatory Visit | Attending: Family Medicine | Admitting: Family Medicine

## 2013-09-13 DIAGNOSIS — Z139 Encounter for screening, unspecified: Secondary | ICD-10-CM

## 2013-09-13 DIAGNOSIS — Z1231 Encounter for screening mammogram for malignant neoplasm of breast: Secondary | ICD-10-CM | POA: Insufficient documentation

## 2013-09-14 ENCOUNTER — Telehealth (HOSPITAL_COMMUNITY): Payer: Self-pay

## 2013-09-14 ENCOUNTER — Ambulatory Visit (HOSPITAL_COMMUNITY)
Admission: RE | Admit: 2013-09-14 | Discharge: 2013-09-14 | Disposition: A | Discharge status: Home / Self Care | Payer: Medicaid Other | Source: Ambulatory Visit | Attending: Oncology | Admitting: Oncology

## 2013-09-14 DIAGNOSIS — C50919 Malignant neoplasm of unspecified site of unspecified female breast: Secondary | ICD-10-CM

## 2013-09-14 DIAGNOSIS — M899 Disorder of bone, unspecified: Secondary | ICD-10-CM | POA: Insufficient documentation

## 2013-09-14 NOTE — Telephone Encounter (Signed)
Patient notified and verbalized understanding of instructions. 

## 2013-09-14 NOTE — Telephone Encounter (Signed)
Message copied by Evelena Leyden on Tue Sep 14, 2013  6:02 PM ------      Message from: Ellouise Newer      Created: Tue Sep 14, 2013  4:56 PM       Bone density is stable.  Continue Ca++ and Vit D ------

## 2014-01-20 NOTE — Progress Notes (Signed)
Deanna Hampshire, MD [place] [address]  Invasive ductal carcinoma of breast  DCIS (ductal carcinoma in situ) of breast  CURRENT THERAPY: Letrozole daily beginning on 12/14/2010 and she will take this medication for 5 years.  INTERVAL HISTORY: Deanna Henderson 63 y.o. 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 She wanted an attempt to preserve her breast thereby receiving adjuvant hormonal (letrozole) therapy for 4 months prior to definitive surgery on the date mentioned above. 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 Once again all 7 lymph nodes were negative. The DCIS was 5.0 cm in size. She completed postoperative radiation therapy on 12/13/2010, then she was placed back on her letrozole 12/14/2010 and will take that for 5 years. She tolerates the letrozole without difficulty or side effects. Her ER receptors were 100%, ER receptors 100%, HER-2/neu was not amplified on the original biopsy specimen.   I personally reviewed and went over laboratory results with the patient.  The results are noted within this dictation.  I personally reviewed and went over radiographic studies with the patient.  The results are noted within this dictation.  Screening mammogram was performed on 09/14/2013 and was BIRADS 1 and she will be due for her next screening mammogram in August 2015.     NCCN guidelines recommends the following surveillance for DCIS of breast:  1. Interval history and physical exam every 6-12 months for 5 years, then annually.  2. Mammogram every 12 months (and 6-12 months postradiation therapy if breast conserved [Category 2B]).  3. If treated with Tamoxifen, monitor per NCCN guidelines for Breast Cancer Risk Reduction.  She  continues to tolerate letrozole without any difficulties. She denies any hot flashes. Additionally, she denies any increased or diffuse muscle aches or joint aches.  She has had some difficulties with her knees and in November 2014, her primary care physician provided her with a steroid injection in the left knee joint and methylprednisolone dosepak. She reports that her knee is much better.  She also reports bilateral lower extremity edema. I've encouraged her to keep her lower extremities elevated when resting at home. She certainly is a sedentary lifestyle.  From an oncology standpoint, are questioning is negative and she denies any complaints. Breast examination is deferred today.  Past Medical History  Diagnosis Date  . 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 History of double vision   . Balance problems   . Obesity 01/22/2012  . Wears partial dentures     has DCIS (ductal carcinoma in situ) of breast; Invasive ductal carcinoma of breast; and Obesity on her problem list.     is allergic to codeine.  Ms. Winiarski does not currently have medications on file.  Past Surgical History  Procedure Laterality Date  . Multiple tooth extractions    . Mastectomy, partial  2011    lt mod radical mastectomy  . Strabismus surgery  07/24/2012    Procedure: REPAIR STRABISMUS BILATERAL;  Surgeon: Derry Skill, MD;  Location: [place];  Service: Ophthalmology;  Laterality: Bilateral;    Denies any headaches, dizziness, double vision, fevers, chills, night sweats, nausea, vomiting, diarrhea, constipation, chest pain, heart palpitations, shortness of  breath, blood in stool, black tarry stool, urinary pain, urinary burning, urinary frequency, hematuria.   PHYSICAL EXAMINATION  ECOG PERFORMANCE STATUS: 2 - Symptomatic, <50% confined to bed  There were no vitals filed for this visit.  GENERAL:alert, no distress, well  nourished, well developed, comfortable, cooperative, obese, smiling and blunted affect SKIN: skin color, texture, turgor are normal, no rashes or significant lesions HEAD: Normocephalic, No masses, lesions, tenderness or abnormalities EYES: normal, PERRLA, EOMI, Conjunctiva are pink and non-injected EARS: External ears normal OROPHARYNX:mucous membranes are moist  NECK: supple, no adenopathy, thyroid normal size, non-tender, without nodularity, no stridor, non-tender, trachea midline LYMPH:  no palpable lymphadenopathy BREAST:risk and benefit of breast self-exam was discussed LUNGS: clear to auscultation  HEART: regular rate & rhythm, no murmurs and no gallops ABDOMEN:obese and normal bowel sounds BACK: Back symmetric, no curvature. EXTREMITIES:less then 2 second capillary refill, no joint deformities, effusion, or inflammation, no skin discoloration, no clubbing, no cyanosis, positive findings:  edema bilateral lower extremity 3+ nonpitting edema  NEURO: alert & oriented x 3 with fluent speech, no focal motor/sensory deficits, rolling walker to help with ambulation   LABORATORY DATA: CBC    Component Value Date/Time   WBC 6.4 01/20/2013 1053   RBC 4.32 01/20/2013 1053   HGB 13.2 01/20/2013 1053   HCT 39.5 01/20/2013 1053   PLT 256 01/20/2013 1053   MCV 91.4 01/20/2013 1053   MCH 30.6 01/20/2013 1053   MCHC 33.4 01/20/2013 1053   RDW 12.9 01/20/2013 1053   LYMPHSABS 1.4 01/20/2013 1053   MONOABS 0.4 01/20/2013 1053   EOSABS 0.1 01/20/2013 1053   BASOSABS 0.0 01/20/2013 1053      Chemistry      Component Value Date/Time   NA 139 01/20/2013 1053   K 4.0 01/20/2013 1053   CL 102 01/20/2013 1053   CO2 26 01/20/2013 1053   BUN 16 01/20/2013 1053   CREATININE 0.74 01/20/2013 1053      Component Value Date/Time   CALCIUM 10.0 01/20/2013 1053   ALKPHOS 63 01/20/2013 1053   AST 18 01/20/2013 1053   ALT 14 01/20/2013 1053   BILITOT 0.4 01/20/2013 1053        RADIOGRAPHIC STUDIES:  09/14/2013  CLINICAL DATA:  Screening.  EXAM:  RIGHT DIGITAL SCREENING MAMMOGRAM WITH CAD  COMPARISON: Previous exam(s)  ACR Breast Density Category c: The breast is heterogeneously dense,  which may obscure small masses.  FINDINGS:  The patient has had a left mastectomy. No suspicious masses,  architectural distortion, or calcifications are present. There are  no findings suspicious for malignancy. Images were processed with  CAD.  IMPRESSION:  No mammographic evidence of malignancy. A result letter of this  screening mammogram will be mailed directly to the patient.  RECOMMENDATION:  Screening mammogram in one year. (Code:SM-B-01Y)  BI-RADS CATEGORY 1. Negative  Electronically Signed  By: Enrique Sack  On: 09/14/2013 10:59    ASSESSMENT:  1. 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 She wanted an attempt to preserve her breast thereby receiving adjuvant hormonal (letrozole) therapy for 4 months prior to definitive surgery on the date mentioned above. 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 Once again all 7 lymph nodes were negative. The DCIS was 5.0 cm in size. She completed postoperative radiation therapy on 12/13/2010, then she was placed back  on her letrozole and will take that for 5 years. She tolerates the letrozole without difficulty or side effects. Her ER receptors were 100%, ER receptors 100%, HER-2/neu was not amplified on the original biopsy specimen.  2. Obesity 3. Osteopenia, on Calcium and Vit D.  Next bone density is due in 08/2015  Patient Active Problem List   Diagnosis Date Noted  . Obesity 01/22/2012  . 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 Invasive ductal carcinoma of breast 07/22/2011    PLAN:  1. I personally reviewed and went over laboratory results with the patient.  The results  are noted within this dictation. 2. I personally reviewed and went over radiographic studies with the patient.  The results are noted within this dictation.   3. Next screening mammogram is due in September 2015. 4. Next bone density is due in September 2016 5. Labs in 6 months: CBC diff, CMET 6. Review of NCCN guidelines 7. Return in 6 months for follow-up.   THERAPY PLAN:  She is to continue with daily Letrozole and she will complete 5 years worth of therapy finishing in December 2016. We will monitor her bone density while on therapy and she was reminded of her yearly screening mammogram. NCCN guidelines recommends the following surveillance for DCIS of breast:  1. Interval history and physical exam every 6-12 months for 5 years, then annually.  2. Mammogram every 12 months (and 6-12 months postradiation therapy if breast conserved [Category 2B]).  3. If treated with Tamoxifen, monitor per NCCN guidelines for Breast Cancer Risk Reduction.  All questions were answered. The patient knows to call the clinic with any problems, questions or concerns. We can certainly see the patient much sooner if necessary.  Patient and plan discussed with Dr. Farrel Gobble and he is in agreement with the aforementioned.   KEFALAS,THOMAS

## 2014-01-21 ENCOUNTER — Encounter (HOSPITAL_COMMUNITY): Payer: Medicaid Other | Attending: Oncology | Admitting: Oncology

## 2014-01-21 ENCOUNTER — Encounter (HOSPITAL_COMMUNITY): Payer: Self-pay | Admitting: Oncology

## 2014-01-21 ENCOUNTER — Encounter (HOSPITAL_BASED_OUTPATIENT_CLINIC_OR_DEPARTMENT_OTHER): Payer: Medicaid Other

## 2014-01-21 VITALS — BP 120/75 | HR 73 | Temp 97.7°F | Resp 20 | Wt 263.5 lb

## 2014-01-21 DIAGNOSIS — R609 Edema, unspecified: Secondary | ICD-10-CM

## 2014-01-21 DIAGNOSIS — D0510 Intraductal carcinoma in situ of unspecified breast: Secondary | ICD-10-CM

## 2014-01-21 DIAGNOSIS — D0590 Unspecified type of carcinoma in situ of unspecified breast: Secondary | ICD-10-CM

## 2014-01-21 DIAGNOSIS — M949 Disorder of cartilage, unspecified: Secondary | ICD-10-CM

## 2014-01-21 DIAGNOSIS — Z853 Personal history of malignant neoplasm of breast: Secondary | ICD-10-CM | POA: Insufficient documentation

## 2014-01-21 DIAGNOSIS — Z9010 Acquired absence of unspecified breast and nipple: Secondary | ICD-10-CM | POA: Insufficient documentation

## 2014-01-21 DIAGNOSIS — Z170 Estrogen receptor positive status [ER+]: Secondary | ICD-10-CM | POA: Insufficient documentation

## 2014-01-21 DIAGNOSIS — M899 Disorder of bone, unspecified: Secondary | ICD-10-CM | POA: Insufficient documentation

## 2014-01-21 DIAGNOSIS — Z09 Encounter for follow-up examination after completed treatment for conditions other than malignant neoplasm: Secondary | ICD-10-CM | POA: Insufficient documentation

## 2014-01-21 DIAGNOSIS — Z923 Personal history of irradiation: Secondary | ICD-10-CM | POA: Insufficient documentation

## 2014-01-21 DIAGNOSIS — C50919 Malignant neoplasm of unspecified site of unspecified female breast: Secondary | ICD-10-CM

## 2014-01-21 DIAGNOSIS — E669 Obesity, unspecified: Secondary | ICD-10-CM | POA: Insufficient documentation

## 2014-01-21 LAB — COMPREHENSIVE METABOLIC PANEL
ALT: 10 U/L (ref 0–35)
AST: 18 U/L (ref 0–37)
Albumin: 4.1 g/dL (ref 3.5–5.2)
Alkaline Phosphatase: 68 U/L (ref 39–117)
BUN: 16 mg/dL (ref 6–23)
CALCIUM: 9.9 mg/dL (ref 8.4–10.5)
CO2: 23 meq/L (ref 19–32)
Chloride: 102 mEq/L (ref 96–112)
Creatinine, Ser: 0.68 mg/dL (ref 0.50–1.10)
GFR calc Af Amer: >90 mL/min (ref >90)
GFR calc non Af Amer: >90 mL/min (ref >90)
Glucose, Bld: 98 mg/dL (ref 70–99)
Potassium: 4 mEq/L (ref 3.7–5.3)
Sodium: 142 mEq/L (ref 137–147)
Total Bilirubin: 0.4 mg/dL (ref 0.3–1.2)
Total Protein: 8.2 g/dL (ref 6.0–8.3)

## 2014-01-21 LAB — CBC WITH DIFFERENTIAL/PLATELET
BASOS ABS: 0 10*3/uL (ref 0.0–0.1)
Basophils Relative: 0 % (ref 0–1)
EOS PCT: 1 % (ref 0–5)
Eosinophils Absolute: 0.1 10*3/uL (ref 0.0–0.7)
HCT: 40.9 % (ref 36.0–46.0)
Hemoglobin: 13.4 g/dL (ref 12.0–15.0)
LYMPHS PCT: 25 % (ref 12–46)
Lymphs Abs: 1.7 10*3/uL (ref 0.7–4.0)
MCH: 30.4 pg (ref 26.0–34.0)
MCHC: 32.8 g/dL (ref 30.0–36.0)
MCV: 92.7 fL (ref 78.0–100.0)
Monocytes Absolute: 0.4 10*3/uL (ref 0.1–1.0)
Monocytes Relative: 6 % (ref 3–12)
Neutro Abs: 4.7 10*3/uL (ref 1.7–7.7)
Neutrophils Relative %: 68 % (ref 43–77)
Platelets: 284 10*3/uL (ref 150–400)
RBC: 4.41 MIL/uL (ref 3.87–5.11)
RDW: 12.8 % (ref 11.5–15.5)
WBC: 6.9 10*3/uL (ref 4.0–10.5)

## 2014-01-21 NOTE — Addendum Note (Signed)
Addended by: Baird Cancer on: 01/21/2014 03:36 PM   Modules accepted: Orders

## 2014-01-21 NOTE — Patient Instructions (Signed)
[facility] Discharge Instructions  RECOMMENDATIONS MADE BY THE CONSULTANT AND ANY TEST RESULTS WILL BE SENT TO YOUR REFERRING PHYSICIAN.  EXAM FINDINGS BY THE PHYSICIAN TODAY AND SIGNS OR SYMPTOMS TO REPORT TO CLINIC OR PRIMARY PHYSICIAN: Exam and findings as discussed by Robynn Pane, PA-C.  You are doing well.  If there are any problems with your blood work we will contact you.  Report any new lumps, bone pain, shortness of breath or other symptoms.  Mammogram is due in September.  MEDICATIONS PRESCRIBED:  Continue letrozole  INSTRUCTIONS/FOLLOW-UP: Follow-up in 6 months with blood work and office visit.  Thank you for choosing [facility] to provide your oncology and hematology care.  To afford each patient quality time with our providers, please arrive at least 15 minutes before your scheduled appointment time.  With your help, our goal is to use those 15 minutes to complete the necessary work-up to ensure our physicians have the information they need to help with your evaluation and healthcare recommendations.    Effective January 1st, 2014, we ask that you re-schedule your appointment with our physicians should you arrive 10 or more minutes late for your appointment.  We strive to give you quality time with our providers, and arriving late affects you and other patients whose appointments are after yours.    Again, thank you for choosing [facility].  Our hope is that these requests will decrease the amount of time that you wait before being seen by our physicians.       _____________________________________________________________  Should you have questions after your visit to [facility], please contact our office at [phone] between the hours of 8:30 a.m. and 5:00 p.m.  Voicemails left after 4:30 p.m. will not be returned until the following business day.  For prescription refill requests, have your pharmacy contact  our office with your prescription refill request.

## 2014-01-21 NOTE — Progress Notes (Signed)
Labs drawn today for cbc/diff,cmp 

## 2014-04-21 ENCOUNTER — Other Ambulatory Visit (HOSPITAL_COMMUNITY): Payer: Self-pay | Admitting: Oncology

## 2014-04-21 DIAGNOSIS — C50919 Malignant neoplasm of unspecified site of unspecified female breast: Secondary | ICD-10-CM

## 2014-04-21 DIAGNOSIS — D0510 Intraductal carcinoma in situ of unspecified breast: Secondary | ICD-10-CM

## 2014-04-21 MED ORDER — LETROZOLE 2.5 MG PO TABS: 2.5000 mg | ORAL_TABLET | Freq: Every day | ORAL | Status: DC

## 2014-07-11 ENCOUNTER — Other Ambulatory Visit (HOSPITAL_COMMUNITY): Payer: Self-pay

## 2014-07-11 DIAGNOSIS — C50919 Malignant neoplasm of unspecified site of unspecified female breast: Secondary | ICD-10-CM

## 2014-07-11 DIAGNOSIS — D0510 Intraductal carcinoma in situ of unspecified breast: Secondary | ICD-10-CM

## 2014-07-21 ENCOUNTER — Encounter (HOSPITAL_COMMUNITY): Payer: Medicaid Other | Attending: Hematology and Oncology

## 2014-07-21 ENCOUNTER — Encounter (HOSPITAL_COMMUNITY): Payer: Self-pay

## 2014-07-21 ENCOUNTER — Encounter (HOSPITAL_BASED_OUTPATIENT_CLINIC_OR_DEPARTMENT_OTHER): Payer: Medicaid Other

## 2014-07-21 VITALS — BP 142/69 | HR 78 | Temp 98.2°F | Resp 18 | Wt 259.0 lb

## 2014-07-21 DIAGNOSIS — M899 Disorder of bone, unspecified: Secondary | ICD-10-CM | POA: Diagnosis not present

## 2014-07-21 DIAGNOSIS — Z9010 Acquired absence of unspecified breast and nipple: Secondary | ICD-10-CM | POA: Diagnosis not present

## 2014-07-21 DIAGNOSIS — D0590 Unspecified type of carcinoma in situ of unspecified breast: Secondary | ICD-10-CM | POA: Insufficient documentation

## 2014-07-21 DIAGNOSIS — Z79899 Other long term (current) drug therapy: Secondary | ICD-10-CM | POA: Diagnosis not present

## 2014-07-21 DIAGNOSIS — C50919 Malignant neoplasm of unspecified site of unspecified female breast: Secondary | ICD-10-CM

## 2014-07-21 DIAGNOSIS — Z170 Estrogen receptor positive status [ER+]: Secondary | ICD-10-CM | POA: Insufficient documentation

## 2014-07-21 DIAGNOSIS — M1990 Unspecified osteoarthritis, unspecified site: Secondary | ICD-10-CM | POA: Diagnosis not present

## 2014-07-21 DIAGNOSIS — Z885 Allergy status to narcotic agent status: Secondary | ICD-10-CM | POA: Diagnosis not present

## 2014-07-21 DIAGNOSIS — D0512 Intraductal carcinoma in situ of left breast: Secondary | ICD-10-CM

## 2014-07-21 DIAGNOSIS — D0510 Intraductal carcinoma in situ of unspecified breast: Secondary | ICD-10-CM

## 2014-07-21 DIAGNOSIS — C50912 Malignant neoplasm of unspecified site of left female breast: Secondary | ICD-10-CM

## 2014-07-21 DIAGNOSIS — M949 Disorder of cartilage, unspecified: Secondary | ICD-10-CM

## 2014-07-21 LAB — CBC WITH DIFFERENTIAL/PLATELET
BASOS PCT: 0 % (ref 0–1)
Basophils Absolute: 0 10*3/uL (ref 0.0–0.1)
Eosinophils Absolute: 0.1 10*3/uL (ref 0.0–0.7)
Eosinophils Relative: 1 % (ref 0–5)
HCT: 42.3 % (ref 36.0–46.0)
Hemoglobin: 14.3 g/dL (ref 12.0–15.0)
Lymphocytes Relative: 24 % (ref 12–46)
Lymphs Abs: 1.9 10*3/uL (ref 0.7–4.0)
MCH: 30.3 pg (ref 26.0–34.0)
MCHC: 33.8 g/dL (ref 30.0–36.0)
MCV: 89.6 fL (ref 78.0–100.0)
MONO ABS: 0.4 10*3/uL (ref 0.1–1.0)
Monocytes Relative: 5 % (ref 3–12)
Neutro Abs: 5.6 10*3/uL (ref 1.7–7.7)
Neutrophils Relative %: 70 % (ref 43–77)
Platelets: 223 10*3/uL (ref 150–400)
RBC: 4.72 MIL/uL (ref 3.87–5.11)
RDW: 12.8 % (ref 11.5–15.5)
WBC: 8 10*3/uL (ref 4.0–10.5)

## 2014-07-21 LAB — BASIC METABOLIC PANEL
Anion gap: 17 — ABNORMAL HIGH (ref 5–15)
BUN: 17 mg/dL (ref 6–23)
CO2: 22 mEq/L (ref 19–32)
CREATININE: 0.67 mg/dL (ref 0.50–1.10)
Calcium: 10.1 mg/dL (ref 8.4–10.5)
Chloride: 101 mEq/L (ref 96–112)
Glucose, Bld: 118 mg/dL — ABNORMAL HIGH (ref 70–99)
Potassium: 4.2 mEq/L (ref 3.7–5.3)
Sodium: 140 mEq/L (ref 137–147)

## 2014-07-21 MED ORDER — LETROZOLE 2.5 MG PO TABS: 2.5000 mg | ORAL_TABLET | Freq: Every day | ORAL | Status: DC

## 2014-07-21 NOTE — Patient Instructions (Signed)
[facility] Discharge Instructions  RECOMMENDATIONS MADE BY THE CONSULTANT AND ANY TEST RESULTS WILL BE SENT TO YOUR REFERRING PHYSICIAN.  Return in 6 months for lab work and office visit.  Thank you for choosing [facility] to provide your oncology and hematology care.  To afford each patient quality time with our providers, please arrive at least 15 minutes before your scheduled appointment time.  With your help, our goal is to use those 15 minutes to complete the necessary work-up to ensure our physicians have the information they need to help with your evaluation and healthcare recommendations.    Effective January 1st, 2014, we ask that you re-schedule your appointment with our physicians should you arrive 10 or more minutes late for your appointment.  We strive to give you quality time with our providers, and arriving late affects you and other patients whose appointments are after yours.    Again, thank you for choosing [facility].  Our hope is that these requests will decrease the amount of time that you wait before being seen by our physicians.       _____________________________________________________________  Should you have questions after your visit to [facility], please contact our office at [phone] between the hours of 8:30 a.m. and 4:30 p.m.  Voicemails left after 4:30 p.m. will not be returned until the following business day.  For prescription refill requests, have your pharmacy contact our office with your prescription refill request.    _______________________________________________________________  We hope that we have given you very good care.  You may receive a patient satisfaction survey in the mail, please complete it and return it as soon as possible.  We value your feedback!  _______________________________________________________________  Have you asked about our STAR program?  STAR stands for  Survivorship Training and Rehabilitation, and this is a nationally recognized cancer care program that focuses on survivorship and rehabilitation.  Cancer and cancer treatments may cause problems, such as, pain, making you feel tired and keeping you from doing the things that you need or want to do. Cancer rehabilitation can help. Our goal is to reduce these troubling effects and help you have the best quality of life possible.  You may receive a survey from a nurse that asks questions about your current state of health.  Based on the survey results, all eligible patients will be referred to the [facility] program for an evaluation so we can better serve you!  A frequently asked questions sheet is available upon request.

## 2014-07-21 NOTE — Progress Notes (Signed)
[facility]  OFFICE PROGRESS NOTE  [name], MD [place] [address]  DIAGNOSIS: Invasive ductal carcinoma of breast, left - Plan: letrozole ([place]) 2.5 MG tablet, CBC with Differential, Comprehensive metabolic panel  DCIS (ductal carcinoma in situ) of breast, left - Plan: letrozole (FEMARA) 2.5 MG tablet  Chief Complaint  Patient presents with  . Follow-up  . DCIS with possible focus of invasion    CURRENT THERAPY: Letrozole 2.5 mg daily started 12/14/2010 with plans to administer for 5 years  INTERVAL HISTORY: Deanna Henderson 63 y.o. 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 She wanted an attempt to preserve her breast thereby receiving adjuvant hormonal (letrozole) therapy for 4 months prior to definitive surgery on the date mentioned above. 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 Once again all 7 lymph nodes were negative. The DCIS was 5.0 cm in size. She completed postoperative radiation therapy on 12/13/2010, then she was placed back on her letrozole 12/14/2010 and will take that for 5 years. She tolerates the letrozole without difficulty or side effects. Her ER receptors were 100%, ER receptors 100%, HER-2/neu was not amplified on the original biopsy specimen. She has severe back pain and is using a TENS unit plus Tylenol. She denies any hot flashes, vaginal dryness, nausea, vomiting, diarrhea, constipation, hematuria, vaginal bleeding, lower extremity swelling that is worsening, PND, orthopnea, palpitations, lymphedema, abnormalities on self breast examination, headache, or seizures.    MEDICAL HISTORY: Past Medical History  Diagnosis Date  . 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    . 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 History of double vision   . Balance problems   . Obesity 01/22/2012  . Wears partial dentures   . 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 She wanted an attempt to preserve her breast thereby receiving adjuvant hormonal (letrozole) therapy for 4 months prior to definitive surgery on the date mentioned above. 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 Once again all 7 lymph nodes were negative. The DCIS was 5.0 cm in size. She completed postoperative radiation therapy on 12/13/2010, then she was placed back on her letrozole 12/14/2010 and will take that for 5 years. She tolerates the letrozole without difficulty or side effects. Her ER receptors were 100%, ER receptors 100%, HER-2/neu was not amplified on the original biopsy specimen. NCCN guidelines recommends the following surveillance for DCIS of breast:  1. Interval history and physical exam every 6-12 months for 5 years, then annually.  2. Mammogram every 12 months (and 6-12 months postradiation therapy if breast conserved [Category 2B]).  3. If treated with Tamoxifen, monitor per NCCN guidelines for Breast Cancer Risk Reduction  ALLERGIES:  is allergic to codeine.  MEDICATIONS: has a current medication list which includes the following prescription(s): acetaminophen, calcium carbonate, calcium carbonate, vitamin d3, corn starch, diphenhydramine, donepezil,  letrozole, levothyroxine, multiple vitamins-minerals, fish oil, phenylephrine-acetaminophen, simvastatin, and sodium chloride.  SURGICAL HISTORY:  Past Surgical  History  Procedure Laterality Date  . Multiple tooth extractions    . Mastectomy, partial  2011    lt mod radical mastectomy  . Strabismus surgery  07/24/2012    Procedure: REPAIR STRABISMUS BILATERAL;  Surgeon: Derry Skill, MD;  Location: [facility];  Service: Ophthalmology;  Laterality: Bilateral;    FAMILY HISTORY: family history includes Cancer in her father, mother, and sister; Diabetes in her sister; Hypertension in her sister.  SOCIAL HISTORY:  reports that she has never smoked. She has never used smokeless tobacco. She reports that she does not drink alcohol or use illicit drugs.  REVIEW OF SYSTEMS:  Other than that discussed above is noncontributory.  PHYSICAL EXAMINATION: ECOG PERFORMANCE STATUS: 1 - Symptomatic but completely ambulatory  Blood pressure 142/69, pulse 78, temperature 98.2 F (36.8 C), temperature source Oral, resp. rate 18, weight 259 lb (117.482 kg).  GENERAL:alert, no distress and comfortable. Morbidly obese. SKIN: skin color, texture, turgor are normal, no rashes or significant lesions EYES: PERLA; Conjunctiva are pink and non-injected, sclera clear SINUSES: No redness or tenderness over maxillary or ethmoid sinuses OROPHARYNX:no exudate, no erythema on lips, buccal mucosa, or tongue. NECK: supple, thyroid normal size, non-tender, without nodularity. No masses CHEST: Status post left mastectomy with no subcutaneous nodules right breast without mass. LYMPH:  no palpable lymphadenopathy in the cervical, axillary or inguinal LUNGS: clear to auscultation and percussion with normal breathing effort HEART: regular rate & rhythm and no murmurs. ABDOMEN:abdomen soft, non-tender and normal bowel sounds MUSCULOSKELETAL:no cyanosis of digits and no clubbing. Range of motion normal.  NEURO: alert & oriented x 3 with fluent speech, no focal motor/sensory deficits   LABORATORY DATA: Lab on 07/21/2014  Component Date Value Ref Range Status  . WBC  07/21/2014 8.0  4.0 - 10.5 K/uL Final  . RBC 07/21/2014 4.72  3.87 - 5.11 MIL/uL Final  . Hemoglobin 07/21/2014 14.3  12.0 - 15.0 g/dL Final  . HCT 07/21/2014 42.3  36.0 - 46.0 % Final  . MCV 07/21/2014 89.6  78.0 - 100.0 fL Final  . MCH 07/21/2014 30.3  26.0 - 34.0 pg Final  . MCHC 07/21/2014 33.8  30.0 - 36.0 g/dL Final  . RDW 07/21/2014 12.8  11.5 - 15.5 % Final  . Platelets 07/21/2014 223  150 - 400 K/uL Final  . Neutrophils Relative % 07/21/2014 70  43 - 77 % Final  . Neutro Abs 07/21/2014 5.6  1.7 - 7.7 K/uL Final  . Lymphocytes Relative 07/21/2014 24  12 - 46 % Final  . Lymphs Abs 07/21/2014 1.9  0.7 - 4.0 K/uL Final  . Monocytes Relative 07/21/2014 5  3 - 12 % Final  . Monocytes Absolute 07/21/2014 0.4  0.1 - 1.0 K/uL Final  . Eosinophils Relative 07/21/2014 1  0 - 5 % Final  . Eosinophils Absolute 07/21/2014 0.1  0.0 - 0.7 K/uL Final  . Basophils Relative 07/21/2014 0  0 - 1 % Final  . Basophils Absolute 07/21/2014 0.0  0.0 - 0.1 K/uL Final  . Sodium 07/21/2014 140  137 - 147 mEq/L Final  . Potassium 07/21/2014 4.2  3.7 - 5.3 mEq/L Final  . Chloride 07/21/2014 101  96 - 112 mEq/L Final  . CO2 07/21/2014 22  19 - 32 mEq/L Final  . Glucose, Bld 07/21/2014 118* 70 - 99 mg/dL Final  . BUN 07/21/2014 17  6 - 23 mg/dL Final  . Creatinine, Ser 07/21/2014 0.67  0.50 - 1.10 mg/dL Final  . Calcium 07/21/2014 10.1  8.4 - 10.5 mg/dL Final  . GFR calc non Af Amer 07/21/2014 >90  >90 mL/min Final  . GFR calc Af Amer 07/21/2014 >90  >90 mL/min Final   Comment: (NOTE)                          The eGFR has been calculated using the CKD EPI equation.                          This calculation has not been validated in all clinical situations.                          eGFR's persistently <90 mL/min signify possible Chronic Kidney                          Disease.  . Anion gap 07/21/2014 17* 5 - 15 Final    PATHOLOGY: No new pathology.  Urinalysis No results found for this basename:  colorurine,  appearanceur,  labspec,  phurine,  glucoseu,  hgbur,  bilirubinur,  ketonesur,  proteinur,  urobilinogen,  nitrite,  leukocytesur    RADIOGRAPHIC STUDIES: Due for mammogram in September 2015 to be ordered by her PCP.  ASSESSMENT:  #1. Noninvasive ductal neoplasia with minimal invasion involving the left breast, no evidence of disease, tolerating letrozole well. #2. Morbid obesity.  #3. Osteopenia, on calcium and vitamin D. #4. Degenerative joint disease, severe, using a TENS unit plus Tylenol.   PLAN:  #1. Continue letrozole 2.5 mg daily. #2. Right mammogram to be ordered by PCP in September 2015. #3. Followup in 6 months with CBC and chem profile.   All questions were answered. The patient knows to call the clinic with any problems, questions or concerns. We can certainly see the patient much sooner if necessary.   I spent 25 minutes counseling the patient face to face. The total time spent in the appointment was 30 minutes.    Doroteo Bradford, MD 07/21/2014 11:17 AM  DISCLAIMER:  This note was dictated with voice recognition software.  Similar sounding words can inadvertently be transcribed inaccurately and may not be corrected upon review.

## 2014-07-21 NOTE — Progress Notes (Signed)
LABS DRAWN FOR CBCD,BMP

## 2014-09-05 ENCOUNTER — Other Ambulatory Visit (HOSPITAL_COMMUNITY): Payer: Self-pay | Admitting: Family Medicine

## 2014-09-05 DIAGNOSIS — Z139 Encounter for screening, unspecified: Secondary | ICD-10-CM

## 2014-09-05 DIAGNOSIS — Z1231 Encounter for screening mammogram for malignant neoplasm of breast: Secondary | ICD-10-CM

## 2014-09-14 ENCOUNTER — Ambulatory Visit (HOSPITAL_COMMUNITY)
Admission: RE | Admit: 2014-09-14 | Discharge: 2014-09-14 | Disposition: A | Discharge status: Home / Self Care | Payer: Medicare Other | Source: Ambulatory Visit | Attending: Family Medicine | Admitting: Family Medicine

## 2014-09-14 DIAGNOSIS — Z1231 Encounter for screening mammogram for malignant neoplasm of breast: Secondary | ICD-10-CM | POA: Diagnosis present

## 2015-01-19 ENCOUNTER — Encounter (HOSPITAL_COMMUNITY): Payer: Self-pay | Admitting: Hematology & Oncology

## 2015-01-19 ENCOUNTER — Ambulatory Visit (HOSPITAL_COMMUNITY)
Admission: RE | Admit: 2015-01-19 | Discharge: 2015-01-19 | Disposition: A | Discharge status: Home / Self Care | Payer: Medicare Other | Source: Ambulatory Visit | Attending: Hematology & Oncology | Admitting: Hematology & Oncology

## 2015-01-19 ENCOUNTER — Encounter (HOSPITAL_COMMUNITY): Payer: Medicare Other | Attending: Hematology & Oncology | Admitting: Hematology & Oncology

## 2015-01-19 ENCOUNTER — Encounter (HOSPITAL_BASED_OUTPATIENT_CLINIC_OR_DEPARTMENT_OTHER): Payer: Medicare Other

## 2015-01-19 VITALS — BP 149/90 | HR 88 | Temp 98.2°F | Resp 18 | Wt 266.4 lb

## 2015-01-19 DIAGNOSIS — D0512 Intraductal carcinoma in situ of left breast: Secondary | ICD-10-CM | POA: Diagnosis not present

## 2015-01-19 DIAGNOSIS — C50912 Malignant neoplasm of unspecified site of left female breast: Secondary | ICD-10-CM

## 2015-01-19 DIAGNOSIS — Z170 Estrogen receptor positive status [ER+]: Secondary | ICD-10-CM | POA: Diagnosis not present

## 2015-01-19 DIAGNOSIS — M8580 Other specified disorders of bone density and structure, unspecified site: Secondary | ICD-10-CM | POA: Diagnosis not present

## 2015-01-19 DIAGNOSIS — R2231 Localized swelling, mass and lump, right upper limb: Secondary | ICD-10-CM | POA: Insufficient documentation

## 2015-01-19 DIAGNOSIS — R2230 Localized swelling, mass and lump, unspecified upper limb: Secondary | ICD-10-CM | POA: Insufficient documentation

## 2015-01-19 DIAGNOSIS — Z139 Encounter for screening, unspecified: Secondary | ICD-10-CM

## 2015-01-19 HISTORY — DX: Other specified disorders of bone density and structure, unspecified site: M85.80

## 2015-01-19 LAB — CBC WITH DIFFERENTIAL/PLATELET
BASOS PCT: 0 % (ref 0–1)
Basophils Absolute: 0 10*3/uL (ref 0.0–0.1)
EOS ABS: 0.1 10*3/uL (ref 0.0–0.7)
EOS PCT: 1 % (ref 0–5)
HCT: 41.2 % (ref 36.0–46.0)
Hemoglobin: 13.4 g/dL (ref 12.0–15.0)
LYMPHS PCT: 26 % (ref 12–46)
Lymphs Abs: 2.1 10*3/uL (ref 0.7–4.0)
MCH: 29.8 pg (ref 26.0–34.0)
MCHC: 32.5 g/dL (ref 30.0–36.0)
MCV: 91.8 fL (ref 78.0–100.0)
MONOS PCT: 6 % (ref 3–12)
Monocytes Absolute: 0.5 10*3/uL (ref 0.1–1.0)
Neutro Abs: 5.5 10*3/uL (ref 1.7–7.7)
Neutrophils Relative %: 67 % (ref 43–77)
PLATELETS: 261 10*3/uL (ref 150–400)
RBC: 4.49 MIL/uL (ref 3.87–5.11)
RDW: 12.9 % (ref 11.5–15.5)
WBC: 8.3 10*3/uL (ref 4.0–10.5)

## 2015-01-19 LAB — COMPREHENSIVE METABOLIC PANEL
ALK PHOS: 63 U/L (ref 39–117)
ALT: 14 U/L (ref 0–35)
AST: 25 U/L (ref 0–37)
Albumin: 4.2 g/dL (ref 3.5–5.2)
Anion gap: 9 (ref 5–15)
BUN: 15 mg/dL (ref 6–23)
CHLORIDE: 105 mmol/L (ref 96–112)
CO2: 25 mmol/L (ref 19–32)
CREATININE: 0.83 mg/dL (ref 0.50–1.10)
Calcium: 9.4 mg/dL (ref 8.4–10.5)
GFR, EST AFRICAN AMERICAN: 84 mL/min — AB (ref >90)
GFR, EST NON AFRICAN AMERICAN: 72 mL/min — AB (ref >90)
Glucose, Bld: 100 mg/dL — ABNORMAL HIGH (ref 70–99)
Potassium: 3.4 mmol/L — ABNORMAL LOW (ref 3.5–5.1)
Sodium: 139 mmol/L (ref 135–145)
TOTAL PROTEIN: 8 g/dL (ref 6.0–8.3)
Total Bilirubin: 0.7 mg/dL (ref 0.3–1.2)

## 2015-01-19 NOTE — Patient Instructions (Signed)
[place] at [facility] Discharge Instructions  RECOMMENDATIONS MADE BY THE CONSULTANT AND ANY TEST RESULTS WILL BE SENT TO YOUR REFERRING PHYSICIAN.  Will do ultrasound of your left axilla today.  Will let you know if there is anything that needs to be done. Report any concerns or issues.  Follow-up: Bone density and mammogram after 09/15/15. Labs and office visit in 6 months.  Thank you for choosing [place] [place] at [facility] to provide your oncology and hematology care.  To afford each patient quality time with our provider, please arrive at least 15 minutes before your scheduled appointment time.    You need to re-schedule your appointment should you arrive 10 or more minutes late.  We strive to give you quality time with our providers, and arriving late affects you and other patients whose appointments are after yours.  Also, if you no show three or more times for appointments you may be dismissed from the clinic at the providers discretion.     Again, thank you for choosing [facility].  Our hope is that these requests will decrease the amount of time that you wait before being seen by our physicians.       _____________________________________________________________  Should you have questions after your visit to [facility], please contact our office at [phone] between the hours of 8:30 a.m. and 4:30 p.m.  Voicemails left after 4:30 p.m. will not be returned until the following business day.  For prescription refill requests, have your pharmacy contact our office.

## 2015-01-19 NOTE — Assessment & Plan Note (Signed)
She has evidence of osteopenia on her last DEXA scan. I discussed the use of Prolia in patients with osteopenia on aromatase inhibitors. We discussed the benefits of these drugs including preventing additional bone loss and also preventing the occurrence of bony metastatic disease. She is currently not interested in pursuing therapy. She will be due for another DEXA scan in September and we will arrange this for her.

## 2015-01-19 NOTE — Assessment & Plan Note (Signed)
[place] 64 year old female with a history of DCIS, per available records tumor size was quite large and original biopsy showed an invasive component. She was treated with neoadjuvant Femara and ultimately mastectomy. Final pathology showed DCIS only. She also underwent radiation therapy. She will complete Femara later this year. In regards to her breast cancer I do not suspect recurrence. He is tolerating her Femara without difficulty. See her back again in 6 months at which time we will schedule a stop date for her Femara. We will arrange for a repeat mammogram of the right breast in September.

## 2015-01-19 NOTE — Progress Notes (Signed)
Deanna Hampshire, MD [place] [id number]    DIAGNOSIS:DCIS of the left breast status post mastectomy with negative nodes (0/7) on 08/27/2010  Initial biopsy on 03/21/2010 showed possible invasive carcinoma the breast with papillary features.  She wanted an attempt to preserve her breast thereby receiving adjuvant hormonal (letrozole) therapy for 4 months prior to definitive surgery on the date mentioned above. 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 Once again all 7 lymph nodes were negative. The DCIS was 5.0 cm in size. She completed postoperative radiation therapy on 12/13/2010, then she was placed back on her letrozole 12/14/2010 and will take that for 5 years. She tolerates the letrozole without difficulty or side effects. Her ER receptors were 100%, ER receptors 100%, HER-2/neu was not amplified on the original biopsy specimen   CURRENT THERAPY: Femara to be completed 11/2015  INTERVAL HISTORY: Deanna Henderson 64 y.o. female returns for follow-up of her breast cancer. She was originally diagnosed in 2011 with DCIS with possible invasive component. She is currently on Femara which she will complete later this year. She realistically has no major complaints. She has somewhat limited mobility, and feels unable to get onto the exam table. She is due for a breast exam today. She will be due for a mammogram and DEXA scan in September.  MEDICAL HISTORY: Past Medical History  Diagnosis Date  . 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 History of double vision   . Balance problems   . Obesity 01/22/2012  . Wears partial dentures   . 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     is allergic to codeine.  Ms.  Winger does not currently have medications on file.  SURGICAL HISTORY: Past Surgical History  Procedure Laterality Date  . Multiple tooth extractions    . Mastectomy, partial  2011    lt mod radical mastectomy  . Strabismus surgery  07/24/2012    Procedure: REPAIR STRABISMUS BILATERAL;  Surgeon: Derry Skill, MD;  Location: [facility];  Service: Ophthalmology;  Laterality: Bilateral;    SOCIAL HISTORY: History   Social History  . Marital Status: Single    Spouse Name: N/A    Number of Children: N/A  . Years of Education: N/A   Occupational History  . Not on file.   Social History Main Topics  . Smoking status: Never Smoker   . Smokeless tobacco: Never Used  . Alcohol Use: No  . Drug Use: No  . Sexual Activity: Not on file   Other Topics Concern  . Not on file   Social History Narrative    FAMILY HISTORY: Family History  Problem Relation Age of Onset  . Cancer Mother   . Cancer Father   . Hypertension Sister   . Diabetes Sister   . Cancer Sister     Review of Systems  Constitutional: Positive for malaise/fatigue.  HENT: Negative.   Eyes: Negative.   Respiratory: Negative.   Cardiovascular: Negative.   Gastrointestinal: Negative.   Genitourinary: Positive for urgency and frequency.  Musculoskeletal: Positive for back pain and joint pain.  Skin: Negative.   Neurological: Positive for weakness. Negative for dizziness, tingling, tremors, sensory change,  speech change, focal weakness, seizures and loss of consciousness.  Endo/Heme/Allergies: Negative.   Psychiatric/Behavioral: Negative.     PHYSICAL EXAMINATION  ECOG PERFORMANCE STATUS: 1 - Symptomatic but completely ambulatory  Uses assistance device for ambulation  Filed Vitals:   01/19/15 1000  BP: 149/90  Pulse: 88  Temp: 98.2 F (36.8 C)  Resp: 18    Physical Exam  Constitutional: She is oriented to person, place, and time and well-developed, well-nourished, and in no  distress.  Obese  HENT:  Head: Normocephalic and atraumatic.  Nose: Nose normal.  Mouth/Throat: Oropharynx is clear and moist. No oropharyngeal exudate.  Eyes: Conjunctivae and EOM are normal. Pupils are equal, round, and reactive to light. Right eye exhibits no discharge. Left eye exhibits no discharge. No scleral icterus.  Neck: Normal range of motion. Neck supple. No tracheal deviation present. No thyromegaly present.  Cardiovascular: Normal rate, regular rhythm and normal heart sounds.  Exam reveals no gallop and no friction rub.   No murmur heard. Pulmonary/Chest: Effort normal and breath sounds normal. She has no wheezes. She has no rales.  Breast exam somewhat limited since patient is in the upright position. R breast is large without obvious skin or nipple change. No discreet palpable mass in the breast. Large mobile palpable mass (5cm) in the lower R axillae, soft. Left chest wall without palpable abnormality. Left axillae clear.   Abdominal: Soft. Bowel sounds are normal. She exhibits no distension and no mass. There is no tenderness. There is no rebound and no guarding.  Obese, exam limited secondary to patient's upright position  Lymphadenopathy:    She has no cervical adenopathy.       Right cervical: No superficial cervical adenopathy present.      Left cervical: No superficial cervical adenopathy present.       Right: No supraclavicular adenopathy present.       Left: No supraclavicular adenopathy present.  Neurological: She is alert and oriented to person, place, and time. She has normal reflexes. No cranial nerve deficit. Gait normal. Coordination normal.  Skin: Skin is warm and dry. No rash noted.  Psychiatric: Mood, memory, affect and judgment normal.  Nursing note and vitals reviewed.   LABORATORY DATA:  CBC    Component Value Date/Time   WBC 8.3 01/19/2015 0956   RBC 4.49 01/19/2015 0956   HGB 13.4 01/19/2015 0956   HCT 41.2 01/19/2015 0956   PLT 261  01/19/2015 0956   MCV 91.8 01/19/2015 0956   MCH 29.8 01/19/2015 0956   MCHC 32.5 01/19/2015 0956   RDW 12.9 01/19/2015 0956   LYMPHSABS 2.1 01/19/2015 0956   MONOABS 0.5 01/19/2015 0956   EOSABS 0.1 01/19/2015 0956   BASOSABS 0.0 01/19/2015 0956   CMP     Component Value Date/Time   NA 139 01/19/2015 0956   K 3.4* 01/19/2015 0956   CL 105 01/19/2015 0956   CO2 25 01/19/2015 0956   GLUCOSE 100* 01/19/2015 0956   BUN 15 01/19/2015 0956   CREATININE 0.83 01/19/2015 0956   CALCIUM 9.4 01/19/2015 0956   PROT 8.0 01/19/2015 0956   ALBUMIN 4.2 01/19/2015 0956   AST 25 01/19/2015 0956   ALT 14 01/19/2015 0956   ALKPHOS 63 01/19/2015 0956   BILITOT 0.7 01/19/2015 0956   GFRNONAA 72* 01/19/2015 0956   GFRAA 84* 01/19/2015 0956      ASSESSMENT and THERAPY PLAN:    Invasive ductal carcinoma of breast Pleasant 64 year old female with a history of DCIS, per  available records tumor size was quite large and original biopsy showed an invasive component. She was treated with neoadjuvant Femara and ultimately mastectomy. Final pathology showed DCIS only. She also underwent radiation therapy. She will complete Femara later this year. In regards to her breast cancer I do not suspect recurrence. He is tolerating her Femara without difficulty. See her back again in 6 months at which time we will schedule a stop date for her Femara. We will arrange for a repeat mammogram of the right breast in September.   Osteopenia She has evidence of osteopenia on her last DEXA scan. I discussed the use of Prolia in patients with osteopenia on aromatase inhibitors. We discussed the benefits of these drugs including preventing additional bone loss and also preventing the occurrence of bony metastatic disease. She is currently not interested in pursuing therapy. She will be due for another DEXA scan in September and we will arrange this for her.   Axillary mass She has a large palpable right axillary mass on  exam today. Consistency of this mass is similar to a lipoma and I suspect that is what it is. However we will get an ultrasound of the right upper extremity, limited exam, to further evaluate this area. If there is any suspicion that it is not a lipoma we will arrange for biopsy.     All questions were answered. The patient knows to call the clinic with any problems, questions or concerns. We can certainly see the patient much sooner if necessary.  Molli Hazard 01/19/2015

## 2015-01-19 NOTE — Progress Notes (Signed)
LABS FOR CMP,CBCD, ADD VD25

## 2015-01-19 NOTE — Assessment & Plan Note (Signed)
She has a large palpable right axillary mass on exam today. Consistency of this mass is similar to a lipoma and I suspect that is what it is. However we will get an ultrasound of the right upper extremity, limited exam, to further evaluate this area. If there is any suspicion that it is not a lipoma we will arrange for biopsy.

## 2015-01-20 LAB — VITAMIN D 25 HYDROXY (VIT D DEFICIENCY, FRACTURES): VIT D 25 HYDROXY: 51.7 ng/mL (ref 30.0–100.0)

## 2015-07-18 ENCOUNTER — Other Ambulatory Visit (HOSPITAL_COMMUNITY): Payer: Self-pay | Admitting: Hematology & Oncology

## 2015-07-20 ENCOUNTER — Encounter (HOSPITAL_COMMUNITY): Payer: Medicare Other | Attending: Hematology & Oncology

## 2015-07-20 ENCOUNTER — Encounter (HOSPITAL_BASED_OUTPATIENT_CLINIC_OR_DEPARTMENT_OTHER): Payer: Medicare Other | Admitting: Hematology & Oncology

## 2015-07-20 ENCOUNTER — Ambulatory Visit (HOSPITAL_COMMUNITY): Payer: Medicare Other | Admitting: Hematology & Oncology

## 2015-07-20 ENCOUNTER — Encounter (HOSPITAL_COMMUNITY): Payer: Self-pay | Admitting: Hematology & Oncology

## 2015-07-20 VITALS — BP 119/95 | HR 73 | Temp 98.6°F | Resp 22 | Wt 260.8 lb

## 2015-07-20 DIAGNOSIS — M8580 Other specified disorders of bone density and structure, unspecified site: Secondary | ICD-10-CM | POA: Diagnosis present

## 2015-07-20 DIAGNOSIS — D0512 Intraductal carcinoma in situ of left breast: Secondary | ICD-10-CM | POA: Insufficient documentation

## 2015-07-20 DIAGNOSIS — C50912 Malignant neoplasm of unspecified site of left female breast: Secondary | ICD-10-CM | POA: Diagnosis not present

## 2015-07-20 DIAGNOSIS — C50919 Malignant neoplasm of unspecified site of unspecified female breast: Secondary | ICD-10-CM | POA: Diagnosis present

## 2015-07-20 DIAGNOSIS — R2231 Localized swelling, mass and lump, right upper limb: Secondary | ICD-10-CM

## 2015-07-20 DIAGNOSIS — Z139 Encounter for screening, unspecified: Secondary | ICD-10-CM

## 2015-07-20 LAB — CBC WITH DIFFERENTIAL/PLATELET
BASOS ABS: 0 10*3/uL (ref 0.0–0.1)
BASOS PCT: 0 % (ref 0–1)
Eosinophils Absolute: 0.1 10*3/uL (ref 0.0–0.7)
Eosinophils Relative: 1 % (ref 0–5)
HEMATOCRIT: 41 % (ref 36.0–46.0)
Hemoglobin: 13.5 g/dL (ref 12.0–15.0)
Lymphocytes Relative: 20 % (ref 12–46)
Lymphs Abs: 1.5 10*3/uL (ref 0.7–4.0)
MCH: 30.3 pg (ref 26.0–34.0)
MCHC: 32.9 g/dL (ref 30.0–36.0)
MCV: 91.9 fL (ref 78.0–100.0)
MONOS PCT: 7 % (ref 3–12)
Monocytes Absolute: 0.5 10*3/uL (ref 0.1–1.0)
NEUTROS ABS: 5.4 10*3/uL (ref 1.7–7.7)
Neutrophils Relative %: 72 % (ref 43–77)
Platelets: 249 10*3/uL (ref 150–400)
RBC: 4.46 MIL/uL (ref 3.87–5.11)
RDW: 12.7 % (ref 11.5–15.5)
WBC: 7.4 10*3/uL (ref 4.0–10.5)

## 2015-07-20 LAB — COMPREHENSIVE METABOLIC PANEL
ALBUMIN: 4.1 g/dL (ref 3.5–5.0)
ALT: 14 U/L (ref 14–54)
AST: 18 U/L (ref 15–41)
Alkaline Phosphatase: 56 U/L (ref 38–126)
Anion gap: 13 (ref 5–15)
BUN: 17 mg/dL (ref 6–20)
CHLORIDE: 102 mmol/L (ref 101–111)
CO2: 25 mmol/L (ref 22–32)
Calcium: 9.8 mg/dL (ref 8.9–10.3)
Creatinine, Ser: 0.8 mg/dL (ref 0.44–1.00)
GFR calc Af Amer: >60 mL/min (ref >60)
Glucose, Bld: 110 mg/dL — ABNORMAL HIGH (ref 65–99)
Potassium: 3.9 mmol/L (ref 3.5–5.1)
Sodium: 140 mmol/L (ref 135–145)
Total Bilirubin: 0.9 mg/dL (ref 0.3–1.2)
Total Protein: 7.5 g/dL (ref 6.5–8.1)

## 2015-07-20 MED ORDER — LETROZOLE 2.5 MG PO TABS: 2.5000 mg | ORAL_TABLET | Freq: Every day | ORAL | Status: DC

## 2015-07-20 MED ORDER — DENOSUMAB 60 MG/ML ~~LOC~~ SOLN
60.0000 mg | Freq: Once | SUBCUTANEOUS | Status: AC
  Administered 2015-07-20: 60 mg via SUBCUTANEOUS
  Filled 2015-07-20: qty 1

## 2015-07-20 NOTE — Progress Notes (Signed)
Labs drawn

## 2015-07-20 NOTE — Progress Notes (Signed)
Deanna Henderson presents today for injection per MD orders. prolia 60 mg administered SQ in right Abdomen. Administration without incident. Patient tolerated well.

## 2015-07-20 NOTE — Progress Notes (Signed)
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  She wanted an attempt to preserve her breast thereby receiving adjuvant hormonal (letrozole) therapy for 4 months prior to definitive surgery on the date mentioned above. 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 Once again all 7 lymph nodes were negative. The DCIS was 5.0 cm in size. She completed postoperative radiation therapy on 12/13/2010, then she was placed back on her letrozole 12/14/2010 and will take that for 5 years. She tolerates the letrozole without difficulty or side effects. Her ER receptors were 100%, ER receptors 100%, HER-2/neu was not amplified on the original biopsy specimen  Osteopenia on DEXA 2016  CURRENT THERAPY: Femara to be completed 11/2015  INTERVAL HISTORY: Deanna Henderson 64 y.o. female returns for follow-up of her breast cancer. She was originally diagnosed in 2011 with DCIS with possible invasive component. She is currently on Femara which she will complete later this year. She realistically has no major complaints. She has somewhat limited mobility, and feels unable to get onto the exam table. She is due for a breast exam today.  She is here alone today. She reports no falls at home, and has been eating and feeling okay. She has been out a little bit this summer, early in the morning due to hot weather in the afternoon. She used to garden but does not anymore. Confirms no problems with diarrhea; good bowels. She walks with cane and does have some difficulty walking. She has never had therapy for her walking.  She last visited the dentist in March. She still has her own teeth. She has spoken to Dr. Everette Rank and her dentist about her Prolia treatment. She gets  yearly teeth cleanings with her dentist.  She is currently taking calcium and vitamin D.  We discussed her ultrasound results from her last visit. Her ultrasound showed a lipoma. Nothing else to do unless it keeps growing and bothers her.  Mammogram on right breast scheduled in October. Bone density is also coming up in October.    MEDICAL HISTORY: Past Medical History  Diagnosis Date  . 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 History of double vision   . Balance problems   . Obesity 01/22/2012  . Wears partial dentures   . 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     is allergic to codeine.  We administered denosumab.  SURGICAL HISTORY: Past Surgical History  Procedure Laterality Date  . Multiple tooth extractions    . Mastectomy, partial  2011    lt mod radical mastectomy  . Strabismus surgery  07/24/2012    Procedure: REPAIR STRABISMUS BILATERAL;  Surgeon: Derry Skill, MD;  Location: [facility];  Service: Ophthalmology;  Laterality: Bilateral;    SOCIAL HISTORY: Social History   Social History  . Marital Status: Single    Spouse Name: N/A  . Number of Children: N/A  . Years of Education: N/A   Occupational History  . Not on file.   Social History Main Topics  .  Smoking status: Never Smoker   . Smokeless tobacco: Never Used  . Alcohol Use: No  . Drug Use: No  . Sexual Activity: Not on file   Other Topics Concern  . Not on file   Social History Narrative    FAMILY HISTORY: Family History  Problem Relation Age of Onset  . Cancer Mother   . Cancer Father   . Hypertension Sister   . Diabetes Sister   . Cancer Sister     Review of Systems  Constitutional: Positive for malaise/fatigue.  HENT: Negative.   Eyes:  Negative.   Respiratory: Negative.   Cardiovascular: Negative.   Gastrointestinal: Negative.   Genitourinary: Positive for urgency and frequency.  Musculoskeletal: Positive for back pain and joint pain.  Skin: Negative.   Neurological: Positive for weakness. Negative for dizziness, tingling, tremors, sensory change, speech change, focal weakness, seizures and loss of consciousness.  Endo/Heme/Allergies: Negative.   Psychiatric/Behavioral: Negative.   14 point review of systems was performed and is negative except as detailed under history of present illness and above    PHYSICAL EXAMINATION  ECOG PERFORMANCE STATUS: 1 - Symptomatic but completely ambulatory  Uses assistance device for ambulation  Filed Vitals:   07/20/15 1013  BP: 119/95  Pulse: 73  Temp: 98.6 F (37 C)  Resp: 22    Physical Exam  Constitutional: She is oriented to person, place, and time and well-developed, well-nourished, and in no distress.  Obese ; ambulated with cane HENT:  Head: Normocephalic and atraumatic.  Nose: Nose normal.  Mouth/Throat: Oropharynx is clear and moist. No oropharyngeal exudate.  Eyes: Conjunctivae and EOM are normal. Pupils are equal, round, and reactive to light. Right eye exhibits no discharge. Left eye exhibits no discharge. No scleral icterus.  Neck: Normal range of motion. Neck supple. No tracheal deviation present. No thyromegaly present.  Cardiovascular: Normal rate, regular rhythm and normal heart sounds.  Exam reveals no gallop and no friction rub.   No murmur heard. Pulmonary/Chest: Effort normal and breath sounds normal. She has no wheezes. She has no rales.  Telangiectasias noted along upper right chest wall along incision site.   Abdominal: Soft. Bowel sounds are normal. She exhibits no distension and no mass. There is no tenderness. There is no rebound and no guarding.  Obese, exam limited secondary to patient's upright position  Lymphadenopathy:    She has no  cervical adenopathy.       Right cervical: No superficial cervical adenopathy present.      Left cervical: No superficial cervical adenopathy present.       Right: No supraclavicular adenopathy present.       Left: No supraclavicular adenopathy present.  Neurological: She is alert and oriented to person, place, and time. She has normal reflexes. No cranial nerve deficit. Gait normal. Coordination normal.  Skin: Skin is warm and dry. No rash noted.  Psychiatric: Mood, memory, affect and judgment normal.  Nursing note and vitals reviewed.   LABORATORY DATA:  CBC    Component Value Date/Time   WBC 7.4 07/20/2015 1013   RBC 4.46 07/20/2015 1013   HGB 13.5 07/20/2015 1013   HCT 41.0 07/20/2015 1013   PLT 249 07/20/2015 1013   MCV 91.9 07/20/2015 1013   MCH 30.3 07/20/2015 1013   MCHC 32.9 07/20/2015 1013   RDW 12.7 07/20/2015 1013   LYMPHSABS 1.5 07/20/2015 1013   MONOABS 0.5 07/20/2015 1013   EOSABS 0.1 07/20/2015 1013   BASOSABS 0.0 07/20/2015 1013  CMP     Component Value Date/Time   NA 140 07/20/2015 1013   K 3.9 07/20/2015 1013   CL 102 07/20/2015 1013   CO2 25 07/20/2015 1013   GLUCOSE 110* 07/20/2015 1013   BUN 17 07/20/2015 1013   CREATININE 0.80 07/20/2015 1013   CALCIUM 9.8 07/20/2015 1013   PROT 7.5 07/20/2015 1013   ALBUMIN 4.1 07/20/2015 1013   AST 18 07/20/2015 1013   ALT 14 07/20/2015 1013   ALKPHOS 56 07/20/2015 1013   BILITOT 0.9 07/20/2015 1013   GFRNONAA >60 07/20/2015 1013   GFRAA >60 07/20/2015 1013     ASSESSMENT and THERAPY PLAN:  DCIS L breast, patient took AI neoadjuvantly Invasive disease noted on biopsy L mastectomy Adjuvant XRT Obesity Hx Hypercholesterolemia Osteopenia  She has no evidence of obvious recurrence. She will be due for mammogram and DEXA this fall we have ordered those studies. We discussed Prolia at her last visit. She has routine ongoing dental care. She has discussed this with her dentist as well. She wishes to  proceed. She takes calcium and vitamin D daily and I have encouraged her to continue.  Follow-up in 6 months.  Orders Placed This Encounter  Procedures  . CBC with Differential    Standing Status: Future     Number of Occurrences:      Standing Expiration Date: 07/19/2016  . Comprehensive metabolic panel    Standing Status: Future     Number of Occurrences:      Standing Expiration Date: 07/19/2016  . SCHEDULING COMMUNICATION    Schedule 15 minute injection appointment.  Cannot select a 6 month frequency so will want to set up another visit in 6 months.    All questions were answered. The patient knows to call the clinic with any problems, questions or concerns. We can certainly see the patient much sooner if necessary.  This document serves as a record of services personally performed by Ancil Linsey, MD. It was created on her behalf by Toni Amend, a trained medical scribe. The creation of this record is based on the scribe's personal observations and the provider's statements to them. This document has been checked and approved by the attending provider.  I have reviewed the above documentation for accuracy and completeness, and I agree with the above.  Molli Hazard, MD  08/26/2015

## 2015-07-20 NOTE — Patient Instructions (Signed)
[place] at [facility] Discharge Instructions  RECOMMENDATIONS MADE BY THE CONSULTANT AND ANY TEST RESULTS WILL BE SENT TO YOUR REFERRING PHYSICIAN.  Exam and discussion by Dr. Whitney Muse. Will plan to get you started on Prolia.  Will need to get it authorized and will get you scheduled for the injection Report any new lumps, bone pain, shortness of breath or other symptoms. Bone Density in October.  Prolia every 6 months and labs and office visit in 6 months.  Thank you for choosing [place] at [facility] to provide your oncology and hematology care.  To afford each patient quality time with our provider, please arrive at least 15 minutes before your scheduled appointment time.    You need to re-schedule your appointment should you arrive 10 or more minutes late.  We strive to give you quality time with our providers, and arriving late affects you and other patients whose appointments are after yours.  Also, if you no show three or more times for appointments you may be dismissed from the clinic at the providers discretion.     Again, thank you for choosing [facility]  Our hope is that these requests will decrease the amount of time that you wait before being seen by our physicians.       _____________________________________________________________  Should you have questions after your visit to [facility], please contact our office at [phone] between the hours of 8:30 a.m. and 4:30 p.m.  Voicemails left after 4:30 p.m. will not be returned until the following business day.  For prescription refill requests, have your pharmacy contact our office.    Denosumab injection What is this medicine? DENOSUMAB (den oh sue mab) slows bone breakdown. Prolia is used to treat osteoporosis in women after menopause and in men. Delton See is used to prevent bone fractures and other bone problems caused by cancer bone metastases.  Delton See is also used to treat giant cell tumor of the bone. This medicine may be used for other purposes; ask your health care provider or pharmacist if you have questions. COMMON BRAND NAME(S): Prolia, XGEVA What should I tell my health care provider before I take this medicine? They need to know if you have any of these conditions: -dental disease -eczema -infection or history of infections -kidney disease or on dialysis -low blood calcium or vitamin D -malabsorption syndrome -scheduled to have surgery or tooth extraction -taking medicine that contains denosumab -thyroid or parathyroid disease -an unusual reaction to denosumab, other medicines, foods, dyes, or preservatives -pregnant or trying to get pregnant -breast-feeding How should I use this medicine? This medicine is for injection under the skin. It is given by a health care professional in a hospital or clinic setting. If you are getting Prolia, a special MedGuide will be given to you by the pharmacist with each prescription and refill. Be sure to read this information carefully each time. For Prolia, talk to your pediatrician regarding the use of this medicine in children. Special care may be needed. For Delton See, talk to your pediatrician regarding the use of this medicine in children. While this drug may be prescribed for children as young as 13 years for selected conditions, precautions do apply. Overdosage: If you think you've taken too much of this medicine contact a poison control center or emergency room at once. Overdosage: If you think you have taken too much of this medicine contact a poison control center or emergency  room at once. NOTE: This medicine is only for you. Do not share this medicine with others. What if I miss a dose? It is important not to miss your dose. Call your doctor or health care professional if you are unable to keep an appointment. What may interact with this medicine? Do not take this medicine with  any of the following medications: -other medicines containing denosumab This medicine may also interact with the following medications: -medicines that suppress the immune system -medicines that treat cancer -steroid medicines like prednisone or cortisone This list may not describe all possible interactions. Give your health care provider a list of all the medicines, herbs, non-prescription drugs, or dietary supplements you use. Also tell them if you smoke, drink alcohol, or use illegal drugs. Some items may interact with your medicine. What should I watch for while using this medicine? Visit your doctor or health care professional for regular checks on your progress. Your doctor or health care professional may order blood tests and other tests to see how you are doing. Call your doctor or health care professional if you get a cold or other infection while receiving this medicine. Do not treat yourself. This medicine may decrease your body's ability to fight infection. You should make sure you get enough calcium and vitamin D while you are taking this medicine, unless your doctor tells you not to. Discuss the foods you eat and the vitamins you take with your health care professional. See your dentist regularly. Brush and floss your teeth as directed. Before you have any dental work done, tell your dentist you are receiving this medicine. Do not become pregnant while taking this medicine or for 5 months after stopping it. Women should inform their doctor if they wish to become pregnant or think they might be pregnant. There is a potential for serious side effects to an unborn child. Talk to your health care professional or pharmacist for more information. What side effects may I notice from receiving this medicine? Side effects that you should report to your doctor or health care professional as soon as possible: -allergic reactions like skin rash, itching or hives, swelling of the face, lips, or  tongue -breathing problems -chest pain -fast, irregular heartbeat -feeling faint or lightheaded, falls -fever, chills, or any other sign of infection -muscle spasms, tightening, or twitches -numbness or tingling -skin blisters or bumps, or is dry, peels, or red -slow healing or unexplained pain in the mouth or jaw -unusual bleeding or bruising Side effects that usually do not require medical attention (Report these to your doctor or health care professional if they continue or are bothersome.): -muscle pain -stomach upset, gas This list may not describe all possible side effects. Call your doctor for medical advice about side effects. You may report side effects to FDA at 1-800-FDA-1088. Where should I keep my medicine? This medicine is only given in a clinic, doctor's office, or other health care setting and will not be stored at home. NOTE: This sheet is a summary. It may not cover all possible information. If you have questions about this medicine, talk to your doctor, pharmacist, or health care provider.  2015, Elsevier/Gold Standard. (2012-06-01 12:37:47)

## 2015-09-18 ENCOUNTER — Ambulatory Visit (HOSPITAL_COMMUNITY): Payer: Medicare Other

## 2015-09-18 ENCOUNTER — Other Ambulatory Visit (HOSPITAL_COMMUNITY): Payer: Self-pay | Admitting: Hematology & Oncology

## 2015-09-18 ENCOUNTER — Ambulatory Visit (HOSPITAL_COMMUNITY)
Admission: RE | Admit: 2015-09-18 | Discharge: 2015-09-18 | Disposition: A | Discharge status: Home / Self Care | Payer: Medicare Other | Source: Ambulatory Visit | Attending: Hematology & Oncology | Admitting: Hematology & Oncology

## 2015-09-18 DIAGNOSIS — Z1231 Encounter for screening mammogram for malignant neoplasm of breast: Secondary | ICD-10-CM | POA: Diagnosis not present

## 2015-09-18 DIAGNOSIS — Z9012 Acquired absence of left breast and nipple: Secondary | ICD-10-CM | POA: Diagnosis not present

## 2015-09-20 ENCOUNTER — Other Ambulatory Visit (HOSPITAL_COMMUNITY): Payer: Self-pay | Admitting: Hematology & Oncology

## 2015-09-20 DIAGNOSIS — Z780 Asymptomatic menopausal state: Secondary | ICD-10-CM

## 2015-09-21 ENCOUNTER — Other Ambulatory Visit (HOSPITAL_COMMUNITY): Payer: Medicare Other

## 2015-09-29 ENCOUNTER — Ambulatory Visit (HOSPITAL_COMMUNITY)
Admission: RE | Admit: 2015-09-29 | Discharge: 2015-09-29 | Disposition: A | Discharge status: Home / Self Care | Payer: Medicare Other | Source: Ambulatory Visit | Attending: Hematology & Oncology | Admitting: Hematology & Oncology

## 2015-09-29 DIAGNOSIS — Z780 Asymptomatic menopausal state: Secondary | ICD-10-CM | POA: Diagnosis not present

## 2015-09-29 DIAGNOSIS — R2231 Localized swelling, mass and lump, right upper limb: Secondary | ICD-10-CM

## 2015-09-29 DIAGNOSIS — M8580 Other specified disorders of bone density and structure, unspecified site: Secondary | ICD-10-CM | POA: Insufficient documentation

## 2015-09-29 DIAGNOSIS — Z139 Encounter for screening, unspecified: Secondary | ICD-10-CM

## 2016-01-19 ENCOUNTER — Ambulatory Visit (HOSPITAL_COMMUNITY): Payer: Medicare Other | Admitting: Hematology & Oncology

## 2016-01-19 ENCOUNTER — Other Ambulatory Visit (HOSPITAL_COMMUNITY): Payer: Medicare Other

## 2016-01-19 ENCOUNTER — Ambulatory Visit (HOSPITAL_COMMUNITY): Payer: Medicare Other

## 2016-02-16 ENCOUNTER — Encounter (HOSPITAL_COMMUNITY): Payer: Medicare Other | Attending: Hematology & Oncology | Admitting: Hematology & Oncology

## 2016-02-16 ENCOUNTER — Encounter (HOSPITAL_BASED_OUTPATIENT_CLINIC_OR_DEPARTMENT_OTHER): Payer: Medicare Other

## 2016-02-16 ENCOUNTER — Encounter (HOSPITAL_COMMUNITY): Payer: Self-pay | Admitting: Hematology & Oncology

## 2016-02-16 ENCOUNTER — Encounter (HOSPITAL_COMMUNITY): Payer: Medicare Other

## 2016-02-16 VITALS — BP 139/70 | HR 57 | Temp 98.0°F | Resp 20 | Wt 263.0 lb

## 2016-02-16 DIAGNOSIS — Z9012 Acquired absence of left breast and nipple: Secondary | ICD-10-CM | POA: Insufficient documentation

## 2016-02-16 DIAGNOSIS — M8588 Other specified disorders of bone density and structure, other site: Secondary | ICD-10-CM | POA: Diagnosis not present

## 2016-02-16 DIAGNOSIS — Z1239 Encounter for other screening for malignant neoplasm of breast: Secondary | ICD-10-CM

## 2016-02-16 DIAGNOSIS — D0512 Intraductal carcinoma in situ of left breast: Secondary | ICD-10-CM | POA: Insufficient documentation

## 2016-02-16 DIAGNOSIS — E669 Obesity, unspecified: Secondary | ICD-10-CM | POA: Diagnosis not present

## 2016-02-16 DIAGNOSIS — C50912 Malignant neoplasm of unspecified site of left female breast: Secondary | ICD-10-CM

## 2016-02-16 DIAGNOSIS — Z853 Personal history of malignant neoplasm of breast: Secondary | ICD-10-CM

## 2016-02-16 DIAGNOSIS — M8580 Other specified disorders of bone density and structure, unspecified site: Secondary | ICD-10-CM | POA: Insufficient documentation

## 2016-02-16 DIAGNOSIS — C50919 Malignant neoplasm of unspecified site of unspecified female breast: Secondary | ICD-10-CM

## 2016-02-16 DIAGNOSIS — Z79811 Long term (current) use of aromatase inhibitors: Secondary | ICD-10-CM | POA: Diagnosis not present

## 2016-02-16 LAB — CBC WITH DIFFERENTIAL/PLATELET
BASOS PCT: 0 %
Basophils Absolute: 0 10*3/uL (ref 0.0–0.1)
EOS ABS: 0.1 10*3/uL (ref 0.0–0.7)
EOS PCT: 2 %
HCT: 39.1 % (ref 36.0–46.0)
HEMOGLOBIN: 12.8 g/dL (ref 12.0–15.0)
Lymphocytes Relative: 23 %
Lymphs Abs: 1.7 10*3/uL (ref 0.7–4.0)
MCH: 30 pg (ref 26.0–34.0)
MCHC: 32.7 g/dL (ref 30.0–36.0)
MCV: 91.6 fL (ref 78.0–100.0)
Monocytes Absolute: 0.3 10*3/uL (ref 0.1–1.0)
Monocytes Relative: 5 %
NEUTROS PCT: 70 %
Neutro Abs: 5.2 10*3/uL (ref 1.7–7.7)
PLATELETS: 252 10*3/uL (ref 150–400)
RBC: 4.27 MIL/uL (ref 3.87–5.11)
RDW: 13 % (ref 11.5–15.5)
WBC: 7.4 10*3/uL (ref 4.0–10.5)

## 2016-02-16 LAB — COMPREHENSIVE METABOLIC PANEL
ALBUMIN: 3.7 g/dL (ref 3.5–5.0)
ALK PHOS: 46 U/L (ref 38–126)
ALT: 15 U/L (ref 14–54)
AST: 21 U/L (ref 15–41)
Anion gap: 8 (ref 5–15)
BUN: 14 mg/dL (ref 6–20)
CALCIUM: 9.5 mg/dL (ref 8.9–10.3)
CHLORIDE: 107 mmol/L (ref 101–111)
CO2: 27 mmol/L (ref 22–32)
CREATININE: 0.79 mg/dL (ref 0.44–1.00)
GFR calc Af Amer: >60 mL/min (ref >60)
GFR calc non Af Amer: >60 mL/min (ref >60)
GLUCOSE: 95 mg/dL (ref 65–99)
Potassium: 4.2 mmol/L (ref 3.5–5.1)
SODIUM: 142 mmol/L (ref 135–145)
Total Bilirubin: 0.7 mg/dL (ref 0.3–1.2)
Total Protein: 7.1 g/dL (ref 6.5–8.1)

## 2016-02-16 MED ORDER — DENOSUMAB 60 MG/ML ~~LOC~~ SOLN
60.0000 mg | Freq: Once | SUBCUTANEOUS | Status: AC
  Administered 2016-02-16: 60 mg via SUBCUTANEOUS
  Filled 2016-02-16: qty 1

## 2016-02-16 NOTE — Progress Notes (Signed)
Deanna [place] presents today for injection per the provider's orders.  prolia  administration without incident; see MAR for injection details.  Patient tolerated procedure well and without incident.  No questions or complaints noted at this time.

## 2016-02-16 NOTE — Progress Notes (Signed)
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  She wanted an attempt to preserve her breast thereby receiving adjuvant hormonal (letrozole) therapy for 4 months prior to definitive surgery on the date mentioned above. 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 Once again all 7 lymph nodes were negative. The DCIS was 5.0 cm in size. She completed postoperative radiation therapy on 12/13/2010, then she was placed back on her letrozole 12/14/2010 and will take that for 5 years. She tolerates the letrozole without difficulty or side effects. Her ER receptors were 100%, ER receptors 100%, HER-2/neu was not amplified on the original biopsy specimen  Osteopenia on DEXA 2016  CURRENT THERAPY: Femara to be completed 11/2015  INTERVAL HISTORY: Deanna Henderson 65 y.o. female returns for follow-up of her breast cancer. She was originally diagnosed in 2011 with DCIS with possible invasive component. She is currently on Femara which she is finishing. She realistically has no major complaints. She has somewhat limited mobility.  Deanna Henderson returns to the [place] alone today. She is extremely soft spoken.  She says she's too stiff to misbehave these days. She wonders if the Femara is part of the reason she's been so stiff over the past few years.  In spite of her joint complaints, she is able to ambulate to the exam table and climb up onto it. She remarks that the warm weather recently made a difference in her joints, but now that it's cold again, it's been a bit worse.  She recently found out that she has cataracts, but they aren't ready for surgery yet.  Deanna Henderson is concerned that her insurance will not pay for her mammogram until October,  and wanted to make sure that that was when her mammogram was scheduled.  She used to see Dr. Everette Rank, but is now seeing Dr. Maudie Mercury.    MEDICAL HISTORY: Past Medical History  Diagnosis Date  . 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 History of double vision   . Balance problems   . Obesity 01/22/2012  . Wears partial dentures   . 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     is allergic to codeine.  We administered denosumab.  SURGICAL HISTORY: Past Surgical History  Procedure Laterality Date  . Multiple tooth extractions    . Mastectomy, partial  2011    lt mod radical mastectomy  . Strabismus surgery  07/24/2012    Procedure: REPAIR STRABISMUS BILATERAL;  Surgeon: Derry Skill, MD;  Location: [place];  Service: Ophthalmology;  Laterality: Bilateral;    SOCIAL HISTORY: Social History   Social History  . Marital Status: Single    Spouse Name: N/A  . Number of Children: N/A  . Years of Education: N/A   Occupational History  . Not on file.   Social History Main Topics  . Smoking status: Never Smoker   . Smokeless tobacco: Never Used  . Alcohol Use: No  . Drug Use: No  . Sexual Activity: Not  on file   Other Topics Concern  . Not on file   Social History Narrative    FAMILY HISTORY: Family History  Problem Relation Age of Onset  . Cancer Mother   . Cancer Father   . Hypertension Sister   . Diabetes Sister   . Cancer Sister     Review of Systems  Constitutional: Positive for malaise/fatigue.  HENT: Negative.   Eyes: Negative.   Respiratory: Negative.   Cardiovascular: Negative.   Gastrointestinal: Negative.   Genitourinary: Positive for urgency and frequency.  Musculoskeletal: Positive  for back pain and joint pain.  Skin: Negative.   Neurological: Positive for weakness. Negative for dizziness, tingling, tremors, sensory change, speech change, focal weakness, seizures and loss of consciousness.  Endo/Heme/Allergies: Negative.   Psychiatric/Behavioral: Negative.   14 point review of systems was performed and is negative except as detailed under history of present illness and above   PHYSICAL EXAMINATION  ECOG PERFORMANCE STATUS: 1 - Symptomatic but completely ambulatory  Uses assistance device for ambulation  Filed Vitals:   02/16/16 1216  BP: 139/70  Pulse: 57  Temp: 98 F (36.7 C)  Resp: 20    Physical Exam  Constitutional: She is oriented to person, place, and time and well-developed, well-nourished, and in no distress.  Obese ; ambulated with cane, Got onto the exam table with assistance. HENT:  Head: Normocephalic and atraumatic.  Nose: Nose normal.  Mouth/Throat: Oropharynx is clear and moist. No oropharyngeal exudate.  Eyes: Conjunctivae and EOM are normal. Pupils are equal, round, and reactive to light. Right eye exhibits no discharge. Left eye exhibits no discharge. No scleral icterus.  Neck: Normal range of motion. Neck supple. No tracheal deviation present. No thyromegaly present.  Cardiovascular: Normal rate, regular rhythm and normal heart sounds.  Exam reveals no gallop and no friction rub.   No murmur heard. Pulmonary/Chest: Effort normal and breath sounds normal. She has no wheezes. She has no rales.  Telangiectasias noted along upper right chest wall along incision site.  Abdominal: Soft. Bowel sounds are normal. She exhibits no distension and no mass. There is no tenderness. There is no rebound and no guarding.  Obese, exam limited secondary to patient's upright position  Lymphadenopathy:    She has no cervical adenopathy.       Right cervical: No superficial cervical adenopathy present.      Left cervical: No superficial cervical adenopathy  present.       Right: No supraclavicular adenopathy present.       Left: No supraclavicular adenopathy present.  Neurological: She is alert and oriented to person, place, and time. She has normal reflexes. No cranial nerve deficit. Gait normal. Coordination normal.  Skin: Skin is warm and dry. No rash noted.  Psychiatric: Mood, memory, affect and judgment normal.  Nursing note and vitals reviewed.   LABORATORY DATA: I have reviewed the data as listed.  CBC    Component Value Date/Time   WBC 7.4 02/16/2016 1107   RBC 4.27 02/16/2016 1107   HGB 12.8 02/16/2016 1107   HCT 39.1 02/16/2016 1107   PLT 252 02/16/2016 1107   MCV 91.6 02/16/2016 1107   MCH 30.0 02/16/2016 1107   MCHC 32.7 02/16/2016 1107   RDW 13.0 02/16/2016 1107   LYMPHSABS 1.7 02/16/2016 1107   MONOABS 0.3 02/16/2016 1107   EOSABS 0.1 02/16/2016 1107   BASOSABS 0.0 02/16/2016 1107   CMP     Component Value Date/Time   NA 142 02/16/2016 1107  K 4.2 02/16/2016 1107   CL 107 02/16/2016 1107   CO2 27 02/16/2016 1107   GLUCOSE 95 02/16/2016 1107   BUN 14 02/16/2016 1107   CREATININE 0.79 02/16/2016 1107   CALCIUM 9.5 02/16/2016 1107   PROT 7.1 02/16/2016 1107   ALBUMIN 3.7 02/16/2016 1107   AST 21 02/16/2016 1107   ALT 15 02/16/2016 1107   ALKPHOS 46 02/16/2016 1107   BILITOT 0.7 02/16/2016 1107   GFRNONAA >60 02/16/2016 1107   GFRAA >60 02/16/2016 1107     ASSESSMENT and THERAPY PLAN:  DCIS L breast, patient took AI neoadjuvantly Invasive disease noted on biopsy L mastectomy Adjuvant XRT Obesity Hx Hypercholesterolemia Osteopenia  I advised her to finish whatever Femara she has left, then she is finished.  She will receive Prolia today and is to continue on calcium and vitamin D.  I have also put in orders for her mammogram on October the 5th.  We will see her back in 6 months.  Orders Placed This Encounter  Procedures  . MM Digital Screening Unilat R    Standing Status: Future     Number  of Occurrences:      Standing Expiration Date: 02/15/2017    Order Specific Question:  Reason for Exam (SYMPTOM  OR DIAGNOSIS REQUIRED)    Answer:  screening    Order Specific Question:  Preferred imaging location?    Answer:  [facility]  . Comprehensive metabolic panel    Standing Status: Standing     Number of Occurrences: 4     Standing Expiration Date: 02/15/2017    All questions were answered. The patient knows to call the clinic with any problems, questions or concerns. We can certainly see the patient much sooner if necessary.  This document serves as a record of services personally performed by Ancil Linsey, MD. It was created on her behalf by Toni Amend, a trained medical scribe. The creation of this record is based on the scribe's personal observations and the provider's statements to them. This document has been checked and approved by the attending provider.  I have reviewed the above documentation for accuracy and completeness, and I agree with the above. Molli Hazard, MD  02/16/2016

## 2016-02-16 NOTE — Patient Instructions (Addendum)
[place] at [facility] Discharge Instructions  RECOMMENDATIONS MADE BY THE CONSULTANT AND ANY TEST RESULTS WILL BE SENT TO YOUR REFERRING PHYSICIAN.   Exam and discussion by Dr Whitney Muse today You can finish taking the femara then you can stop taking that. Mammogram scheduled  Prolia today prolia every 6 months with labs prior  Return to see the doctor in 6 months Please call the clinic if you have any questions or concerns    Thank you for choosing [place] at [facility] to provide your oncology and hematology care.  To afford each patient quality time with our provider, please arrive at least 15 minutes before your scheduled appointment time.   Beginning January 23rd 2017 lab work for the [facility] will be done in the  Main lab at [facility] on 1st floor. If you have a lab appointment with the [facility] please come in thru the  Main Entrance and check in at the main information desk  You need to re-schedule your appointment should you arrive 10 or more minutes late.  We strive to give you quality time with our providers, and arriving late affects you and other patients whose appointments are after yours.  Also, if you no show three or more times for appointments you may be dismissed from the clinic at the providers discretion.     Again, thank you for choosing [facility].  Our hope is that these requests will decrease the amount of time that you wait before being seen by our physicians.       _____________________________________________________________  Should you have questions after your visit to [facility], please contact our office at [phone] between the hours of 8:30 a.m. and 4:30 p.m.  Voicemails left after 4:30 p.m. will not be returned until the following business day.  For prescription refill requests, have your pharmacy contact our office.         Resources For Cancer Patients  and their Caregivers ? American Cancer Society: Can assist with transportation, wigs, general needs, runs Look Good Feel Better.        (863)690-8226 ? Cancer Care: Provides financial assistance, online support groups, medication/co-pay assistance.  1-800-813-HOPE 6194825720) ? [place] Assists [place] cancer patients and their families through emotional , educational and financial support.  (640)218-5995 ? [place] DSS Where to apply for food stamps, Medicaid and utility assistance. (830)311-2013 ? [facility]: Transportation to medical appointments. (336)331-0144 ? Social Security Administration: May apply for disability if have a Stage IV cancer. (202) 848-1613 8177014478 ? [employer], Disability and Transit Services: Assists with nutrition, care and transit needs. 347-280-9822

## 2016-08-20 ENCOUNTER — Encounter (HOSPITAL_COMMUNITY): Payer: Medicare Other

## 2016-08-20 ENCOUNTER — Ambulatory Visit (HOSPITAL_COMMUNITY): Payer: Medicare Other

## 2016-08-20 ENCOUNTER — Encounter (HOSPITAL_COMMUNITY): Payer: Self-pay | Admitting: Oncology

## 2016-08-20 ENCOUNTER — Encounter (HOSPITAL_COMMUNITY): Payer: Medicare Other | Attending: Oncology | Admitting: Oncology

## 2016-08-20 DIAGNOSIS — Z9012 Acquired absence of left breast and nipple: Secondary | ICD-10-CM | POA: Insufficient documentation

## 2016-08-20 DIAGNOSIS — M8580 Other specified disorders of bone density and structure, unspecified site: Secondary | ICD-10-CM | POA: Insufficient documentation

## 2016-08-20 DIAGNOSIS — Z923 Personal history of irradiation: Secondary | ICD-10-CM | POA: Diagnosis not present

## 2016-08-20 DIAGNOSIS — Z1239 Encounter for other screening for malignant neoplasm of breast: Secondary | ICD-10-CM

## 2016-08-20 DIAGNOSIS — C50912 Malignant neoplasm of unspecified site of left female breast: Secondary | ICD-10-CM

## 2016-08-20 DIAGNOSIS — D0512 Intraductal carcinoma in situ of left breast: Secondary | ICD-10-CM | POA: Insufficient documentation

## 2016-08-20 DIAGNOSIS — Z853 Personal history of malignant neoplasm of breast: Secondary | ICD-10-CM

## 2016-08-20 LAB — COMPREHENSIVE METABOLIC PANEL
ALT: 13 U/L — ABNORMAL LOW (ref 14–54)
AST: 21 U/L (ref 15–41)
Albumin: 4.2 g/dL (ref 3.5–5.0)
Alkaline Phosphatase: 44 U/L (ref 38–126)
Anion gap: 11 (ref 5–15)
BILIRUBIN TOTAL: 0.5 mg/dL (ref 0.3–1.2)
BUN: 15 mg/dL (ref 6–20)
CO2: 24 mmol/L (ref 22–32)
Calcium: 9.5 mg/dL (ref 8.9–10.3)
Chloride: 104 mmol/L (ref 101–111)
Creatinine, Ser: 0.88 mg/dL (ref 0.44–1.00)
Glucose, Bld: 103 mg/dL — ABNORMAL HIGH (ref 65–99)
POTASSIUM: 3.7 mmol/L (ref 3.5–5.1)
Sodium: 139 mmol/L (ref 135–145)
TOTAL PROTEIN: 7.4 g/dL (ref 6.5–8.1)

## 2016-08-20 NOTE — Progress Notes (Signed)
Deanna Gravel, MD [place] [address]  Invasive ductal carcinoma of breast, left ([place])  Osteopenia  CURRENT THERAPY: Surveillance per NCCN guidelines.  INTERVAL HISTORY: Deanna Henderson 65 y.o. 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 She wanted an attempt to preserve her breast thereby receiving adjuvant hormonal (letrozole) therapy for 4 months prior to definitive surgery on the date mentioned above. 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 Once again all 7 lymph nodes were negative. The DCIS was 5.0 cm in size. She completed postoperative radiation therapy on 12/13/2010, then she was placed back on her letrozole 12/14/2010 and will take that for 5 years. She tolerates the letrozole without difficulty or side effects. Her ER receptors were 100%, PR receptors 100%, HER-2/neu was not amplified on the original biopsy specimen.     Invasive ductal carcinoma of breast ([place])   03/26/2010 Pathology Results    Invasive ductal carcinoma of left breast with papillar features.      04/16/2010 - 08/20/2010 Anti-estrogen oral therapy    Letrozole x 4 months pre-operatively in an attempt to preserve breast with lumpectomy      08/27/2010 Definitive Surgery    Left mastectomy.  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  DCIS was 5.0 cm in size.  ER 100%, PR 100%, HER2 NEGATIVE.      11/01/2010 - 12/13/2010 Radiation Therapy    Radiation therapy      12/14/2010 - 03/14/2016 Anti-estrogen oral therapy    Femara x 5 years      09/29/2015 Imaging    Bone density- BMD as determined from Femur Neck Right is 0.860 g/cm2 with a T-Score of -1.3. This patient is considered osteopenic  according to [facility]) criteria.       She reports that some of her aches and pains are resolved/improved since being off AI therapy.  She notes that she feel much better.    She is educated that Prolia will not be paid for by her insurance co as she is no longer on AI therapy.  As a result, we will discontinue this supportive therapy plan.  She is agreeable to this, but concerned.  Review of Systems  Constitutional: Negative for chills, fever, malaise/fatigue and weight loss.  HENT: Negative.   Eyes: Negative.   Respiratory: Negative.  Negative for cough and shortness of breath.   Cardiovascular: Negative for chest pain.  Gastrointestinal: Negative.  Negative for abdominal pain, blood in stool, constipation, diarrhea, nausea and vomiting.  Genitourinary: Negative.   Musculoskeletal: Negative.   Skin: Negative.   Neurological: Negative.  Negative for weakness.  Endo/Heme/Allergies: Negative.   Psychiatric/Behavioral: Negative.     Past Medical History:  Diagnosis Date  . Balance problems   . 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 History of double vision   . Invasive ductal carcinoma of breast ([place]) 07/22/2011  . Obesity 01/22/2012  . Osteopenia 01/19/2015  . Wears partial dentures     Past Surgical History:  Procedure Laterality Date  . MASTECTOMY, PARTIAL  2011   lt mod radical  mastectomy  . MULTIPLE TOOTH EXTRACTIONS    . STRABISMUS SURGERY  07/24/2012   Procedure: REPAIR STRABISMUS BILATERAL;  Surgeon: Derry Skill, MD;  Location: [place];  Service: Ophthalmology;  Laterality: Bilateral;    Family History  Problem Relation Age of Onset  . Cancer Mother   . Cancer Father   . Hypertension Sister   . Diabetes Sister   . Cancer Sister     Social History   Social History  . Marital status: Single    Spouse name: N/A  . Number of children: N/A  . Years  of education: N/A   Social History Main Topics  . Smoking status: Never Smoker  . Smokeless tobacco: Never Used  . Alcohol use No  . Drug use: No  . Sexual activity: Not Asked   Other Topics Concern  . None   Social History Narrative  . None     PHYSICAL EXAMINATION  ECOG PERFORMANCE STATUS: 2 - Symptomatic, <50% confined to bed  Vitals:   08/20/16 1000  BP: (!) 153/77  Pulse: 76  Resp: 18  Temp: 98.3 F (36.8 C)    GENERAL:alert, no distress, well nourished, well developed, cooperative, crying, obese, smiling and unaccompanied, using cane SKIN: skin color, texture, turgor are normal, no rashes or significant lesions HEAD: Normocephalic, No masses, lesions, tenderness or abnormalities EYES: normal, EOMI, Conjunctiva are pink and non-injected EARS: External ears normal OROPHARYNX:lips, buccal mucosa, and tongue normal and mucous membranes are moist  NECK: supple, no adenopathy, thyroid normal size, non-tender, without nodularity, trachea midline LYMPH:  no palpable lymphadenopathy, no hepatosplenomegaly BREAST:right breast normal without mass, skin or nipple changes or axillary nodes, left post-mastectomy site well healed and free of suspicious changes LUNGS: clear to auscultation and percussion HEART: regular rate & rhythm, no murmurs, no gallops, S1 normal and S2 normal ABDOMEN:abdomen soft, non-tender, obese, normal bowel sounds and no masses or organomegaly BACK: Back symmetric, no curvature. EXTREMITIES:less then 2 second capillary refill, no joint deformities, effusion, or inflammation, no skin discoloration, no cyanosis  NEURO: alert & oriented x 3 with fluent speech, no focal motor/sensory deficits, slow gait with assistance of cane requiring significant assistance getting up on exam table.   LABORATORY DATA: CBC    Component Value Date/Time   WBC 7.4 02/16/2016 1107   RBC 4.27 02/16/2016 1107   HGB 12.8 02/16/2016 1107   HCT 39.1 02/16/2016 1107   PLT 252  02/16/2016 1107   MCV 91.6 02/16/2016 1107   MCH 30.0 02/16/2016 1107   MCHC 32.7 02/16/2016 1107   RDW 13.0 02/16/2016 1107   LYMPHSABS 1.7 02/16/2016 1107   MONOABS 0.3 02/16/2016 1107   EOSABS 0.1 02/16/2016 1107   BASOSABS 0.0 02/16/2016 1107      Chemistry      Component Value Date/Time   NA 139 08/20/2016 0929   K 3.7 08/20/2016 0929   CL 104 08/20/2016 0929   CO2 24 08/20/2016 0929   BUN 15 08/20/2016 0929   CREATININE 0.88 08/20/2016 0929      Component Value Date/Time   CALCIUM 9.5 08/20/2016 0929   ALKPHOS 44 08/20/2016 0929   AST 21 08/20/2016 0929   ALT 13 (L) 08/20/2016 0929   BILITOT 0.5 08/20/2016 0929        PENDING LABS:   RADIOGRAPHIC STUDIES:  No results found.   PATHOLOGY:    ASSESSMENT AND PLAN:  Invasive ductal carcinoma of breast ([facility]) DCIS of the left breast status post  mastectomy with negative nodes (0/7) on 08/27/2010 after initial biopsy on 03/21/2010 showed possible invasive carcinoma the breast with papillary features. She wanted an attempt to preserve her breast thereby receiving adjuvant hormonal (letrozole) therapy for 4 months prior to definitive surgery on the date mentioned above. 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 Once again all 7 lymph nodes were negative. The DCIS was 5.0 cm in size. She completed postoperative radiation therapy on 12/13/2010, then she was placed back on her letrozole 12/14/2010 and will take that for 5 years. She tolerates the letrozole without difficulty or side effects. Her ER receptors were 100%, PR receptors 100%, HER-2/neu was not amplified on the original biopsy specimen.  Oncology history is developed.  I personally reviewed and went over radiographic studies with the patient.  The results are noted within this dictation.  Mammogram Oct 2016 was negative.  Order is placed for mammogram next month (October).  No oncology role for labs.  Return in 12 months  for follow-up.  Osteopenia Osteopenia while on aromatase inhibitor.  On Ca++, Vit D, and Prolia every 6 months.  Prolia DISCONTINUED now that she is no longer on AI therapy.  Supportive therapy plan is deleted.     ORDERS PLACED FOR THIS ENCOUNTER: No orders of the defined types were placed in this encounter.   MEDICATIONS PRESCRIBED THIS ENCOUNTER: No orders of the defined types were placed in this encounter.   THERAPY PLAN:  NCCN guidelines recommends the following surveillance for invasive breast cancer (2.2017):  A. History and Physical exam 1-4 times per year as clinically appropriate for 5 years, then annually.  B. Periodic screening for changes in family history and referral to genetics counseling as indicated  C. Educate, monitor, and refer to lymphedema management.  D. Mammography every 12 months  E. Routine imaging of reconstructed breast is not indicated.  F. In the absence of clinical signs and symptoms suggestive of recurrent disease, there is no indication for laboratory or imaging studies for metastases screening.  G. Women on Tamoxifen: annual gynecologic assessment every 12 months if uterus is present.  H. Women on aromatase inhibitor or who experience ovarian failure secondary to treatment should have monitoring of bone health with a bone mineral density determination at baseline and periodically thereafter.  I. Assess and encourage adherence to adjuvant endocrine therapy.  J. Evidence suggests that active lifestyle, healthy diet, limited alcohol intake, and achieving and maintaining an ideal body weight (20-25 BMI) may lead to optimal breast cancer outcomes.   All questions were answered. The patient knows to call the clinic with any problems, questions or concerns. We can certainly see the patient much sooner if necessary.  Patient and plan discussed with Dr. Ancil Linsey and she is in agreement with the aforementioned.   This note is electronically signed  by: Doy Mince 08/20/2016 5:25 PM

## 2016-08-20 NOTE — Assessment & Plan Note (Addendum)
Osteopenia while on aromatase inhibitor.  On Ca++, Vit D, and Prolia every 6 months.  Prolia DISCONTINUED now that she is no longer on AI therapy.  Supportive therapy plan is deleted.

## 2016-08-20 NOTE — Assessment & Plan Note (Signed)
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 She wanted an attempt to preserve her breast thereby receiving adjuvant hormonal (letrozole) therapy for 4 months prior to definitive surgery on the date mentioned above. 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 Once again all 7 lymph nodes were negative. The DCIS was 5.0 cm in size. She completed postoperative radiation therapy on 12/13/2010, then she was placed back on her letrozole 12/14/2010 and will take that for 5 years. She tolerates the letrozole without difficulty or side effects. Her ER receptors were 100%, PR receptors 100%, HER-2/neu was not amplified on the original biopsy specimen.  Oncology history is developed.  I personally reviewed and went over radiographic studies with the patient.  The results are noted within this dictation.  Mammogram Oct 2016 was negative.  Order is placed for mammogram next month (October).  No oncology role for labs.  Return in 12 months for follow-up.

## 2016-08-20 NOTE — Progress Notes (Unsigned)
Per Kirby Crigler PA-C pt no longer needs Prolia injection. Pt is no longer on Aromatase inhibitors.   Unable to contact pt prior top appointment time due to no working number other than emergency contact in chart.

## 2016-08-20 NOTE — Patient Instructions (Signed)
[place] at [facility] Discharge Instructions  RECOMMENDATIONS MADE BY THE CONSULTANT AND ANY TEST RESULTS WILL BE SENT TO YOUR REFERRING PHYSICIAN.  You were seen by Gershon Mussel today Mammogram in October Discontinue Prolia injection Continue Calcium and Vitamin D Return in 12 months for follow up  Thank you for choosing [place] at [facility] to provide your oncology and hematology care.  To afford each patient quality time with our provider, please arrive at least 15 minutes before your scheduled appointment time.   Beginning January 23rd 2017 lab work for the [facility] will be done in the  Main lab at [facility] on 1st floor. If you have a lab appointment with the [facility] please come in thru the  Main Entrance and check in at the main information desk  You need to re-schedule your appointment should you arrive 10 or more minutes late.  We strive to give you quality time with our providers, and arriving late affects you and other patients whose appointments are after yours.  Also, if you no show three or more times for appointments you may be dismissed from the clinic at the providers discretion.     Again, thank you for choosing [facility].  Our hope is that these requests will decrease the amount of time that you wait before being seen by our physicians.       _____________________________________________________________  Should you have questions after your visit to [facility], please contact our office at [phone] between the hours of 8:30 a.m. and 4:30 p.m.  Voicemails left after 4:30 p.m. will not be returned until the following business day.  For prescription refill requests, have your pharmacy contact our office.         Resources For Cancer Patients and their Caregivers ? American Cancer Society: Can assist with transportation, wigs, general needs, runs Look Good Feel Better.         2545871173 ? Cancer Care: Provides financial assistance, online support groups, medication/co-pay assistance.  1-800-813-HOPE 909 796 0181) ? [place] Assists [place] cancer patients and their families through emotional , educational and financial support.  (214) 887-9681 ? [place] DSS Where to apply for food stamps, Medicaid and utility assistance. 248-116-6508 ? [facility]: Transportation to medical appointments. 832-529-6165 ? Social Security Administration: May apply for disability if have a Stage IV cancer. 8304190570 339-781-5821 ? [employer], Disability and Transit Services: Assists with nutrition, care and transit needs. [place] Support Programs: @10RELATIVEDAYS @ > Cancer Support Group  2nd Tuesday of the month 1pm-2pm, Journey Room  > Creative Journey  3rd Tuesday of the month 1130am-1pm, Journey Room  > Look Good Feel Better  1st Wednesday of the month 10am-12 noon, Journey Room (Call [place] to register [phone])

## 2016-09-23 ENCOUNTER — Ambulatory Visit (HOSPITAL_COMMUNITY): Payer: Medicare Other

## 2016-09-23 ENCOUNTER — Other Ambulatory Visit (HOSPITAL_COMMUNITY): Payer: Self-pay | Admitting: Hematology & Oncology

## 2016-09-23 DIAGNOSIS — Z1239 Encounter for other screening for malignant neoplasm of breast: Secondary | ICD-10-CM

## 2016-09-25 ENCOUNTER — Ambulatory Visit (HOSPITAL_COMMUNITY)
Admission: RE | Admit: 2016-09-25 | Discharge: 2016-09-25 | Disposition: A | Discharge status: Home / Self Care | Payer: Medicare Other | Source: Ambulatory Visit | Attending: Hematology & Oncology | Admitting: Hematology & Oncology

## 2016-09-25 ENCOUNTER — Ambulatory Visit: Payer: Medicare Other

## 2016-09-25 DIAGNOSIS — Z1231 Encounter for screening mammogram for malignant neoplasm of breast: Secondary | ICD-10-CM | POA: Insufficient documentation

## 2016-09-25 DIAGNOSIS — Z1239 Encounter for other screening for malignant neoplasm of breast: Secondary | ICD-10-CM

## 2017-02-17 ENCOUNTER — Ambulatory Visit (HOSPITAL_COMMUNITY): Payer: Medicare Other | Admitting: Hematology & Oncology

## 2017-02-17 ENCOUNTER — Ambulatory Visit (HOSPITAL_COMMUNITY): Payer: Medicare Other

## 2017-02-17 ENCOUNTER — Other Ambulatory Visit (HOSPITAL_COMMUNITY): Payer: Medicare Other

## 2017-08-19 ENCOUNTER — Encounter (HOSPITAL_COMMUNITY): Payer: Medicare Other | Attending: Oncology | Admitting: Oncology

## 2017-08-19 ENCOUNTER — Encounter (HOSPITAL_COMMUNITY): Payer: Self-pay

## 2017-08-19 VITALS — BP 135/58 | HR 69 | Temp 97.8°F | Resp 20 | Wt 245.1 lb

## 2017-08-19 DIAGNOSIS — Z86000 Personal history of in-situ neoplasm of breast: Secondary | ICD-10-CM | POA: Diagnosis present

## 2017-08-19 DIAGNOSIS — C50912 Malignant neoplasm of unspecified site of left female breast: Secondary | ICD-10-CM

## 2017-08-19 DIAGNOSIS — M8580 Other specified disorders of bone density and structure, unspecified site: Secondary | ICD-10-CM | POA: Diagnosis not present

## 2017-08-19 NOTE — Progress Notes (Signed)
Deanna Henderson, Deanna Henderson [id number]  Infiltrating ductal carcinoma of left breast [facility]) - Plan: MM Digital Screening Unilat R, DG Bone Density  Osteopenia, unspecified location - Plan: MM Digital Screening Unilat R, DG Bone Density  CURRENT THERAPY: Surveillance per NCCN guidelines.  INTERVAL HISTORY: Deanna Henderson 66 y.o. 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 She wanted an attempt to preserve her breast thereby receiving adjuvant hormonal (letrozole) therapy for 4 months prior to definitive surgery on the date mentioned above. 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 Once again all 7 lymph nodes were negative. The DCIS was 5.0 cm in size. She completed postoperative radiation therapy on 12/13/2010, then she was placed back on her letrozole 12/14/2010 and will take that for 5 years. She tolerates the letrozole without difficulty or side effects. Her ER receptors were 100%, PR receptors 100%, HER-2/neu was not amplified on the original biopsy specimen.     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      04/16/2010 - 08/20/2010 Anti-estrogen oral therapy    Letrozole x 4 months pre-operatively in an attempt to preserve breast with lumpectomy      08/27/2010 Definitive Surgery    Left mastectomy.  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  DCIS was 5.0 cm in size.  ER 100%, PR 100%, HER2 NEGATIVE.      11/01/2010 - 12/13/2010 Radiation Therapy    Radiation therapy      12/14/2010 - 03/14/2016 Anti-estrogen oral therapy    Femara x 5 years      09/29/2015  Imaging    Bone density- BMD as determined from Femur Neck Right is 0.860 g/cm2 with a T-Score of -1.3. This patient is considered osteopenic according to [facility]) criteria.        Patient presents for follow up today. She states that she has been doing well since her last visit. She has lost 10 lbs since she got off the AI in March 2017. She states she has occasional breast tenderness at the 12 o'clock position of her right breast but denies noting any discreet mass. Screening right breast mammogram on 09/25/16 was BI-RADS 1.   Review of Systems  Constitutional: Negative for chills, fever, malaise/fatigue and weight loss.  HENT: Negative.   Eyes: Negative.   Respiratory: Negative.  Negative for cough and shortness of breath.   Cardiovascular: Negative for chest pain.  Gastrointestinal: Negative.  Negative for abdominal pain, blood in stool, constipation, diarrhea, nausea and vomiting.  Genitourinary: Negative.   Musculoskeletal: Negative.   Skin: Negative.   Neurological: Negative.  Negative for weakness.  Endo/Heme/Allergies: Negative.   Psychiatric/Behavioral: Negative.     Past Medical History:  Diagnosis Date  . Balance problems   . 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 History of double vision   . Invasive ductal carcinoma of breast ([place]) 07/22/2011  . Obesity 01/22/2012  . Osteopenia 01/19/2015  . Wears partial dentures  Past Surgical History:  Procedure Laterality Date  . MASTECTOMY, PARTIAL  2011   lt mod radical mastectomy  . MULTIPLE TOOTH EXTRACTIONS    . STRABISMUS SURGERY  07/24/2012   Procedure: REPAIR STRABISMUS BILATERAL;  Surgeon: Derry Skill, MD;  Location: [place];  Service: Ophthalmology;  Laterality: Bilateral;    Family History  Problem Relation Age of Onset  . Cancer Mother   . Cancer Father   . Hypertension Sister   . Diabetes  Sister   . Cancer Sister     Social History   Social History  . Marital status: Single    Spouse name: N/A  . Number of children: N/A  . Years of education: N/A   Social History Main Topics  . Smoking status: Never Smoker  . Smokeless tobacco: Never Used  . Alcohol use No  . Drug use: No  . Sexual activity: Not Currently   Other Topics Concern  . None   Social History Narrative  . None     PHYSICAL EXAMINATION  ECOG PERFORMANCE STATUS: 2 - Symptomatic, <50% confined to bed  Vitals:   08/19/17 1032  BP: (!) 135/58  Pulse: 69  Resp: 20  Temp: 97.8 F (36.6 C)  SpO2: 99%    GENERAL:alert, no distress, well nourished, well developed, cooperative, crying, obese, smiling and unaccompanied, using cane SKIN: skin color, texture, turgor are normal, no rashes or significant lesions HEAD: Normocephalic, No masses, lesions, tenderness or abnormalities EYES: normal, EOMI, Conjunctiva are pink and non-injected EARS: External ears normal OROPHARYNX:lips, buccal mucosa, and tongue normal and mucous membranes are moist  NECK: supple, no adenopathy, thyroid normal size, non-tender, without nodularity, trachea midline LYMPH:  no palpable lymphadenopathy, no hepatosplenomegaly BREAST:right breast normal without mass, skin or nipple changes or axillary nodes, left post-mastectomy site well healed and no subcutaneous nodules palpated. No skin changes on left chest wall. LUNGS: clear to auscultation and percussion HEART: regular rate & rhythm, no murmurs, no gallops, S1 normal and S2 normal ABDOMEN:abdomen soft, non-tender, obese, normal bowel sounds and no masses or organomegaly BACK: Back symmetric, no curvature. EXTREMITIES:less then 2 second capillary refill, no joint deformities, effusion, or inflammation, no skin discoloration, no cyanosis  NEURO: alert & oriented x 3 with fluent speech, no focal motor/sensory deficits, slow gait with assistance of cane requiring significant  assistance getting up on exam table.   LABORATORY DATA: CBC    Component Value Date/Time   WBC 7.4 02/16/2016 1107   RBC 4.27 02/16/2016 1107   HGB 12.8 02/16/2016 1107   HCT 39.1 02/16/2016 1107   PLT 252 02/16/2016 1107   MCV 91.6 02/16/2016 1107   MCH 30.0 02/16/2016 1107   MCHC 32.7 02/16/2016 1107   RDW 13.0 02/16/2016 1107   LYMPHSABS 1.7 02/16/2016 1107   MONOABS 0.3 02/16/2016 1107   EOSABS 0.1 02/16/2016 1107   BASOSABS 0.0 02/16/2016 1107      Chemistry      Component Value Date/Time   NA 139 08/20/2016 0929   K 3.7 08/20/2016 0929   CL 104 08/20/2016 0929   CO2 24 08/20/2016 0929   BUN 15 08/20/2016 0929   CREATININE 0.88 08/20/2016 0929      Component Value Date/Time   CALCIUM 9.5 08/20/2016 0929   ALKPHOS 44 08/20/2016 0929   AST 21 08/20/2016 0929   ALT 13 (L) 08/20/2016 0929   BILITOT 0.5 08/20/2016 0929        ASSESSMENT AND PLAN:  1.  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 She wanted an attempt to preserve her breast thereby receiving adjuvant hormonal (letrozole) therapy for 4 months prior to definitive surgery on the date mentioned above. 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 Once again all 7 lymph nodes were negative. The DCIS was 5.0 cm in size. She completed postoperative radiation therapy on 12/13/2010, then she was placed back on her letrozole 12/14/2010 and completed 5 years of therapy.  -clinically NED on breast exam today.  -ordered annual right unilateral screening mammogram for next month.  -RTC in 1 year for annual follow up.  2. Osteopenia -Will order her DEXA scan to be completed next month.    RTC in 1 year for follow up.   ORDERS PLACED FOR THIS ENCOUNTER: Orders Placed This Encounter  Procedures  . MM Digital Screening Unilat R  .  DG Bone Density    MEDICATIONS PRESCRIBED THIS ENCOUNTER: Meds ordered this encounter  Medications  . Loratadine (CLARITIN PO)    Sig: Take 1 tablet by mouth daily.  . fluticasone (FLONASE) 50 MCG/ACT nasal spray    Sig: Place 2 sprays into both nostrils daily.    THERAPY PLAN:  NCCN guidelines recommends the following surveillance for invasive breast cancer (2.2017):  A. History and Physical exam 1-4 times per year as clinically appropriate for 5 years, then annually.  B. Periodic screening for changes in family history and referral to genetics counseling as indicated  C. Educate, monitor, and refer to lymphedema management.  D. Mammography every 12 months  E. Routine imaging of reconstructed breast is not indicated.  F. In the absence of clinical signs and symptoms suggestive of recurrent disease, there is no indication for laboratory or imaging studies for metastases screening.  G. Women on Tamoxifen: annual gynecologic assessment every 12 months if uterus is present.  H. Women on aromatase inhibitor or who experience ovarian failure secondary to treatment should have monitoring of bone health with a bone mineral density determination at baseline and periodically thereafter.  I. Assess and encourage adherence to adjuvant endocrine therapy.  J. Evidence suggests that active lifestyle, healthy diet, limited alcohol intake, and achieving and maintaining an ideal body weight (20-25 BMI) may lead to optimal breast cancer outcomes.   All questions were answered. The patient knows to call the clinic with any problems, questions or concerns. We can certainly see the patient much sooner if necessary.  This note is electronically signed by: Twana First, MD 08/19/2017 10:51 AM

## 2017-08-19 NOTE — Patient Instructions (Signed)
[facility] at [facility] Discharge Instructions  RECOMMENDATIONS MADE BY THE CONSULTANT AND ANY TEST RESULTS WILL BE SENT TO YOUR REFERRING PHYSICIAN.  You saw Dr. Zhou today.  Thank you for choosing [facility] at [facility] to provide your oncology and hematology care.  To afford each patient quality time with our provider, please arrive at least 15 minutes before your scheduled appointment time.    If you have a lab appointment with the Cancer Center please come in thru the  Main Entrance and check in at the main information desk  You need to re-schedule your appointment should you arrive 10 or more minutes late.  We strive to give you quality time with our providers, and arriving late affects you and other patients whose appointments are after yours.  Also, if you no show three or more times for appointments you may be dismissed from the clinic at the providers discretion.     Again, thank you for choosing [facility].  Our hope is that these requests will decrease the amount of time that you wait before being seen by our physicians.       _____________________________________________________________  Should you have questions after your visit to [facility], please contact our office at [phone] between the hours of 8:30 a.m. and 4:30 p.m.  Voicemails left after 4:30 p.m. will not be returned until the following business day.  For prescription refill requests, have your pharmacy contact our office.       Resources For Cancer Patients and their Caregivers ? American Cancer Society: Can assist with transportation, wigs, general needs, runs Look Good Feel Better.        1-888-227-6333 ? Cancer Care: Provides financial assistance, online support groups, medication/co-pay assistance.  1-800-813-HOPE (4673) ? [facility] Assists [place] cancer patients and their families through  emotional , educational and financial support.  336-427-4357 ? [place] DSS Where to apply for food stamps, Medicaid and utility assistance. 336-342-1394 ? [facility]: Transportation to medical appointments. 336-347-2287 ? Social Security Administration: May apply for disability if have a Stage IV cancer. 336-342-7796 1-800-772-1213 ? [place] Aging, Disability and Transit Services: Assists with nutrition, care and transit needs. 336-349-2343  Cancer Center Support Programs: @10RELATIVEDAYS@ > Cancer Support Group  2nd Tuesday of the month 1pm-2pm, Journey Room  > Creative Journey  3rd Tuesday of the month 1130am-1pm, Journey Room  > Look Good Feel Better  1st Wednesday of the month 10am-12 noon, Journey Room (Call American Cancer Society to register 1-800-395-5775)    

## 2017-10-08 ENCOUNTER — Other Ambulatory Visit (HOSPITAL_COMMUNITY): Payer: Self-pay | Admitting: Oncology

## 2017-10-08 ENCOUNTER — Ambulatory Visit (HOSPITAL_COMMUNITY)
Admission: RE | Admit: 2017-10-08 | Discharge: 2017-10-08 | Disposition: A | Discharge status: Home / Self Care | Payer: Medicare Other | Source: Ambulatory Visit | Attending: Oncology | Admitting: Oncology

## 2017-10-08 ENCOUNTER — Ambulatory Visit (HOSPITAL_COMMUNITY): Payer: Medicare Other

## 2017-10-08 ENCOUNTER — Other Ambulatory Visit: Payer: Self-pay | Admitting: Oncology

## 2017-10-08 DIAGNOSIS — C50912 Malignant neoplasm of unspecified site of left female breast: Secondary | ICD-10-CM

## 2017-10-08 DIAGNOSIS — Z1231 Encounter for screening mammogram for malignant neoplasm of breast: Secondary | ICD-10-CM | POA: Insufficient documentation

## 2017-10-08 DIAGNOSIS — M8580 Other specified disorders of bone density and structure, unspecified site: Secondary | ICD-10-CM

## 2017-10-08 DIAGNOSIS — N6310 Unspecified lump in the right breast, unspecified quadrant: Secondary | ICD-10-CM

## 2017-10-27 ENCOUNTER — Ambulatory Visit (HOSPITAL_COMMUNITY)
Admission: RE | Admit: 2017-10-27 | Discharge: 2017-10-27 | Disposition: A | Discharge status: Home / Self Care | Payer: Medicare Other | Source: Ambulatory Visit | Attending: Oncology | Admitting: Oncology

## 2017-10-27 DIAGNOSIS — N6310 Unspecified lump in the right breast, unspecified quadrant: Secondary | ICD-10-CM | POA: Insufficient documentation

## 2017-10-27 DIAGNOSIS — R928 Other abnormal and inconclusive findings on diagnostic imaging of breast: Secondary | ICD-10-CM | POA: Diagnosis not present

## 2018-08-04 ENCOUNTER — Other Ambulatory Visit (HOSPITAL_COMMUNITY): Payer: Self-pay | Admitting: Internal Medicine

## 2018-08-04 DIAGNOSIS — Z1231 Encounter for screening mammogram for malignant neoplasm of breast: Secondary | ICD-10-CM

## 2018-08-19 ENCOUNTER — Ambulatory Visit (HOSPITAL_COMMUNITY): Payer: Medicare Other | Admitting: Internal Medicine

## 2018-11-06 ENCOUNTER — Ambulatory Visit (HOSPITAL_COMMUNITY)
Admission: RE | Admit: 2018-11-06 | Discharge: 2018-11-06 | Disposition: A | Discharge status: Home / Self Care | Payer: Medicare Other | Source: Ambulatory Visit | Attending: Internal Medicine | Admitting: Internal Medicine

## 2018-11-06 ENCOUNTER — Other Ambulatory Visit (HOSPITAL_COMMUNITY): Payer: Self-pay | Admitting: Internal Medicine

## 2018-11-06 DIAGNOSIS — Z1231 Encounter for screening mammogram for malignant neoplasm of breast: Secondary | ICD-10-CM

## 2018-11-09 ENCOUNTER — Encounter (HOSPITAL_COMMUNITY): Payer: Self-pay | Admitting: Internal Medicine

## 2018-11-09 ENCOUNTER — Other Ambulatory Visit (HOSPITAL_COMMUNITY): Payer: Self-pay | Admitting: Internal Medicine

## 2018-11-09 ENCOUNTER — Inpatient Hospital Stay (HOSPITAL_COMMUNITY): Payer: Medicare Other | Attending: Hematology | Admitting: Internal Medicine

## 2018-11-09 ENCOUNTER — Other Ambulatory Visit: Payer: Self-pay

## 2018-11-09 VITALS — BP 149/65 | HR 87 | Temp 97.8°F | Resp 16 | Wt 237.5 lb

## 2018-11-09 DIAGNOSIS — Z780 Asymptomatic menopausal state: Secondary | ICD-10-CM | POA: Insufficient documentation

## 2018-11-09 DIAGNOSIS — Z9012 Acquired absence of left breast and nipple: Secondary | ICD-10-CM

## 2018-11-09 DIAGNOSIS — Z170 Estrogen receptor positive status [ER+]: Secondary | ICD-10-CM | POA: Insufficient documentation

## 2018-11-09 DIAGNOSIS — Z1231 Encounter for screening mammogram for malignant neoplasm of breast: Secondary | ICD-10-CM

## 2018-11-09 DIAGNOSIS — Z79899 Other long term (current) drug therapy: Secondary | ICD-10-CM | POA: Diagnosis not present

## 2018-11-09 DIAGNOSIS — M8580 Other specified disorders of bone density and structure, unspecified site: Secondary | ICD-10-CM | POA: Diagnosis not present

## 2018-11-09 DIAGNOSIS — Z9223 Personal history of estrogen therapy: Secondary | ICD-10-CM

## 2018-11-09 DIAGNOSIS — Z86000 Personal history of in-situ neoplasm of breast: Secondary | ICD-10-CM

## 2018-11-09 DIAGNOSIS — C50912 Malignant neoplasm of unspecified site of left female breast: Secondary | ICD-10-CM

## 2018-11-09 DIAGNOSIS — Z923 Personal history of irradiation: Secondary | ICD-10-CM | POA: Diagnosis not present

## 2018-11-09 NOTE — Progress Notes (Signed)
Diagnosis Infiltrating ductal carcinoma of left breast ([place]) - Plan: MM Digital Screening Unilat R, DG Bone Density  Postmenopausal - Plan: MM Digital Screening Unilat R, DG Bone Density  Staging Cancer Staging No matching staging information was found for the patient.  Assessment and Plan:  1. DCIS of the left breast.  Pt was previously followed by Dr. Talbert Cage.  She is  status post mastectomy with negative nodes (0/7) on 08/27/2010 after initial biopsy on 03/21/2010 showed possible invasive carcinoma the breast with papillary features. She wanted an attempt to preserve her breast thereby receiving adjuvant hormonal (letrozole) therapy for 4 months prior to definitive surgery on the date mentioned above. 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 Once again all 7 lymph nodes were negative. The DCIS was 5.0 cm in size. She completed postoperative radiation therapy on 12/13/2010, then she was placed back on her letrozole 12/14/2010 and completed 5 years of therapy.  Pt had right screening mammogram done 11/06/2018 that was reviewed and is negative.  She will continue yearly follow-up with mammogram in 10/2019.  She is advised to notify the office if any problems prior to that time.    2. Osteopenia.  Last Dexa was done 09/2017.  Pt will be set up for Dexa in 10/2019 with mammogram for follow-up.  Continue calcium and vitamin D.    Current Status;  Pt is seen today for follow-up.  She is here to go over mammogram.      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    04/16/2010 - 08/20/2010 Anti-estrogen oral therapy    Letrozole x 4 months pre-operatively in an attempt to preserve breast with lumpectomy    08/27/2010 Definitive Surgery    Left mastectomy.  0/7 nodes involved    08/27/2010 Pathology Results    Papillary type ductal carcinoma in situ, 0/7 nodes  involved.  DCIS was 5.0 cm in size.  ER 100%, PR 100%, HER2 NEGATIVE.    11/01/2010 - 12/13/2010 Radiation Therapy    Radiation therapy    12/14/2010 - 03/14/2016 Anti-estrogen oral therapy    Femara x 5 years    09/29/2015 Imaging    Bone density- BMD as determined from Femur Neck Right is 0.860 g/cm2 with a T-Score of -1.3. This patient is considered osteopenic according to [facility]) criteria.       Problem List Patient Active Problem List   Diagnosis Date Noted  . Post-menopausal [Z78.0] 09/20/2015  . Axillary mass [R22.30] 01/19/2015  . Osteopenia [M85.80] 01/19/2015  . Obesity [E66.9] 01/22/2012  . DCIS (ductal carcinoma in situ) of breast [D05.10] 07/22/2011  . Invasive ductal carcinoma of breast ([place]) [C50.919] 07/22/2011    Past Medical History Past Medical History:  Diagnosis Date  . Balance problems   . 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 History of double vision   . Invasive ductal carcinoma of breast ([place]) 07/22/2011  . Obesity 01/22/2012  . Osteopenia 01/19/2015  . Wears partial dentures     Past Surgical History Past Surgical History:  Procedure Laterality Date  . MASTECTOMY, PARTIAL  2011   lt mod radical mastectomy  . MULTIPLE TOOTH EXTRACTIONS    . STRABISMUS SURGERY  07/24/2012   Procedure: REPAIR STRABISMUS BILATERAL;  Surgeon: Derry Skill,  MD;  Location: [place];  Service: Ophthalmology;  Laterality: Bilateral;    Family History Family History  Problem Relation Age of Onset  . Cancer Mother   . Cancer Father   . Hypertension Sister   . Diabetes Sister   . Cancer Sister      Social History  reports that she has never smoked. She has never used smokeless tobacco. She reports that she does not drink alcohol or use drugs.  Medications  Current Outpatient Medications:  .  acetaminophen (TYLENOL) 500 MG tablet, Take 500 mg by  mouth every 6 (six) hours as needed., Disp: , Rfl:  .  Calcium Carbonate (CALCIUM 600 HIGH POTENCY PO), Take 600 mg by mouth 2 (two) times daily. , Disp: , Rfl:  .  calcium carbonate (TUMS EX) 750 MG chewable tablet, Chew 1 tablet by mouth as needed., Disp: , Rfl:  .  Calcium Carbonate Antacid (TUMS CHEWY DELIGHTS PO), Take 1 capsule by mouth as needed. Contains 1177 mg calcium carbonate, Disp: , Rfl:  .  Cholecalciferol (VITAMIN D3) 1000 UNITS CAPS, Take by mouth daily.  , Disp: , Rfl:  .  Corn Starch (BABY CORNSTARCH) POWD, Apply topically., Disp: , Rfl:  .  donepezil (ARICEPT) 10 MG tablet, Take 10 mg by mouth daily.  , Disp: , Rfl:  .  fluticasone (FLONASE) 50 MCG/ACT nasal spray, Place 2 sprays into both nostrils daily., Disp: , Rfl:  .  hydrochlorothiazide (HYDRODIURIL) 25 MG tablet, 25 mg daily. , Disp: , Rfl: 2 .  levothyroxine (SYNTHROID, LEVOTHROID) 88 MCG tablet, Take 75 mcg by mouth daily. Taking 75 mcg daily, Disp: , Rfl:  .  loratadine (CLARITIN) 10 MG tablet, TAKE 1 TABLET BY MOUTH DAILY FOR ALLERGIES, Disp: , Rfl: 3 .  Multiple Vitamins-Minerals (CVS SPECTRAVITE SENIOR PO), Take by mouth daily.  , Disp: , Rfl:  .  Omega-3 Fatty Acids (FISH OIL) 1200 MG CAPS, Take by mouth 3 (three) times daily.  , Disp: , Rfl:  .  Phenylephrine-Acetaminophen (TYLENOL SINUS CONGESTION/PAIN) 5-325 MG TABS, Take by mouth., Disp: , Rfl:  .  simvastatin (ZOCOR) 40 MG tablet, Take 40 mg by mouth at bedtime.  , Disp: , Rfl:  .  sodium chloride (OCEAN) 0.65 % nasal spray, Place 1 spray into the nose as needed., Disp: , Rfl:   Allergies Codeine  Review of Systems Review of Systems - Oncology ROS as per HPI otherwise 12 point ROS is negative.   Physical Exam  Vitals Wt Readings from Last 3 Encounters:  11/09/18 237 lb 8 oz (107.7 kg)  08/19/17 245 lb 1.6 oz (111.2 kg)  08/20/16 253 lb 4.8 oz (114.9 kg)   Temp Readings from Last 3 Encounters:  11/09/18 97.8 F (36.6 C) (Oral)  08/19/17 97.8  F (36.6 C) (Oral)  08/20/16 98.3 F (36.8 C) (Oral)   BP Readings from Last 3 Encounters:  11/09/18 (!) 149/65  08/19/17 (!) 135/58  08/20/16 (!) 153/77   Pulse Readings from Last 3 Encounters:  11/09/18 87  08/19/17 69  08/20/16 76   Constitutional: Well-developed, well-nourished, and in no distress.   HENT: Head: Normocephalic and atraumatic.  Mouth/Throat: No oropharyngeal exudate. Mucosa moist. Eyes: Pupils are equal, round, and reactive to light. Conjunctivae are normal. No scleral icterus.  Neck: Normal range of motion. Neck supple. No JVD present.  Cardiovascular: Normal rate, regular rhythm and normal heart sounds.  Exam reveals no gallop and no friction rub.   No murmur  heard. Pulmonary/Chest: Effort normal and breath sounds normal. No respiratory distress. No wheezes.No rales.  Abdominal: Soft. Bowel sounds are normal. No distension. There is no tenderness. There is no guarding.  Musculoskeletal: No edema or tenderness.  Lymphadenopathy: No cervical, axillary or supraclavicular adenopathy.  Neurological: Alert and oriented to person, place, and time. No cranial nerve deficit.  Skin: Skin is warm and dry. No rash noted. No erythema. No pallor.  Psychiatric: Affect and judgment normal.  Breast exam:  Chaperone present.  Left mastectomy healed well.  No palpable dominant chest wall lesions noted.  Right breast shows no palpable dominant masses.    Labs No visits with results within 3 Day(s) from this visit.  Latest known visit with results is:  Appointment on 08/20/2016  Component Date Value Ref Range Status  . Sodium 08/20/2016 139  135 - 145 mmol/L Final  . Potassium 08/20/2016 3.7  3.5 - 5.1 mmol/L Final  . Chloride 08/20/2016 104  101 - 111 mmol/L Final  . CO2 08/20/2016 24  22 - 32 mmol/L Final  . Glucose, Bld 08/20/2016 103* 65 - 99 mg/dL Final  . BUN 08/20/2016 15  6 - 20 mg/dL Final  . Creatinine, Ser 08/20/2016 0.88  0.44 - 1.00 mg/dL Final  . Calcium  08/20/2016 9.5  8.9 - 10.3 mg/dL Final  . Total Protein 08/20/2016 7.4  6.5 - 8.1 g/dL Final  . Albumin 08/20/2016 4.2  3.5 - 5.0 g/dL Final  . AST 08/20/2016 21  15 - 41 U/L Final  . ALT 08/20/2016 13* 14 - 54 U/L Final  . Alkaline Phosphatase 08/20/2016 44  38 - 126 U/L Final  . Total Bilirubin 08/20/2016 0.5  0.3 - 1.2 mg/dL Final  . GFR calc non Af Amer 08/20/2016 >60  >60 mL/min Final  . GFR calc Af Amer 08/20/2016 >60  >60 mL/min Final   Comment: (NOTE) The eGFR has been calculated using the CKD EPI equation. This calculation has not been validated in all clinical situations. eGFR's persistently <60 mL/min signify possible Chronic Kidney Disease.   . Anion gap 08/20/2016 11  5 - 15 Final     Pathology Orders Placed This Encounter  Procedures  . MM Digital Screening Unilat R    Standing Status:   Future    Standing Expiration Date:   11/09/2019    Order Specific Question:   Reason for Exam (SYMPTOM  OR DIAGNOSIS REQUIRED)    Answer:   dcis left breast    Order Specific Question:   Preferred imaging location?    Answer:   [facility]    Standing Status:   Future    Standing Expiration Date:   11/09/2019    Order Specific Question:   Reason for Exam (SYMPTOM  OR DIAGNOSIS REQUIRED)    Answer:   postmenopausal    Order Specific Question:   Preferred imaging location?    Answer:   [facility]       Zoila Shutter MD

## 2018-11-09 NOTE — Patient Instructions (Signed)
[facility] at [facility]  Discharge Instructions: You saw Dr. Higgs today                               _______________________________________________________________  Thank you for choosing [facility] at [facility] to provide your oncology and hematology care.  To afford each patient quality time with our providers, please arrive at least 15 minutes before your scheduled appointment.  You need to re-schedule your appointment if you arrive 10 or more minutes late.  We strive to give you quality time with our providers, and arriving late affects you and other patients whose appointments are after yours.  Also, if you no show three or more times for appointments you may be dismissed from the clinic.  Again, thank you for choosing [facility] at [facility]. Our hope is that these requests will allow you access to exceptional care and in a timely manner. _______________________________________________________________  If you have questions after your visit, please contact our office at [phone] between the hours of 8:30 a.m. and 5:00 p.m. Voicemails left after 4:30 p.m. will not be returned until the following business day. _______________________________________________________________  For prescription refill requests, have your pharmacy contact our office. _______________________________________________________________  Recommendations made by the consultant and any test results will be sent to your referring physician. _______________________________________________________________ 

## 2019-11-01 ENCOUNTER — Ambulatory Visit (HOSPITAL_COMMUNITY): Payer: Medicare Other | Admitting: Hematology

## 2019-11-08 ENCOUNTER — Ambulatory Visit (HOSPITAL_COMMUNITY): Payer: Medicare Other

## 2019-11-08 ENCOUNTER — Ambulatory Visit (HOSPITAL_COMMUNITY)
Admission: RE | Admit: 2019-11-08 | Discharge: 2019-11-08 | Disposition: A | Discharge status: Home / Self Care | Payer: Medicare Other | Source: Ambulatory Visit | Attending: Internal Medicine | Admitting: Internal Medicine

## 2019-11-08 ENCOUNTER — Other Ambulatory Visit: Payer: Self-pay

## 2019-11-08 DIAGNOSIS — C50912 Malignant neoplasm of unspecified site of left female breast: Secondary | ICD-10-CM

## 2019-11-08 DIAGNOSIS — Z780 Asymptomatic menopausal state: Secondary | ICD-10-CM

## 2019-11-08 DIAGNOSIS — Z1231 Encounter for screening mammogram for malignant neoplasm of breast: Secondary | ICD-10-CM

## 2019-11-15 ENCOUNTER — Other Ambulatory Visit: Payer: Self-pay

## 2019-11-15 ENCOUNTER — Inpatient Hospital Stay (HOSPITAL_COMMUNITY): Payer: Medicare Other | Attending: Hematology | Admitting: Hematology

## 2019-11-15 ENCOUNTER — Encounter (HOSPITAL_COMMUNITY): Payer: Self-pay | Admitting: Hematology

## 2019-11-15 VITALS — BP 110/58 | HR 93 | Temp 97.5°F | Resp 20 | Wt 245.2 lb

## 2019-11-15 DIAGNOSIS — Z170 Estrogen receptor positive status [ER+]: Secondary | ICD-10-CM | POA: Diagnosis not present

## 2019-11-15 DIAGNOSIS — Z853 Personal history of malignant neoplasm of breast: Secondary | ICD-10-CM | POA: Insufficient documentation

## 2019-11-15 DIAGNOSIS — C50912 Malignant neoplasm of unspecified site of left female breast: Secondary | ICD-10-CM

## 2019-11-15 DIAGNOSIS — Z79899 Other long term (current) drug therapy: Secondary | ICD-10-CM | POA: Diagnosis not present

## 2019-11-15 DIAGNOSIS — M8580 Other specified disorders of bone density and structure, unspecified site: Secondary | ICD-10-CM | POA: Diagnosis not present

## 2019-11-15 DIAGNOSIS — Z9012 Acquired absence of left breast and nipple: Secondary | ICD-10-CM | POA: Insufficient documentation

## 2019-11-15 DIAGNOSIS — Z1231 Encounter for screening mammogram for malignant neoplasm of breast: Secondary | ICD-10-CM

## 2019-11-15 NOTE — Assessment & Plan Note (Addendum)
1.  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 -She was given letrozole for 4 months to conserve her breast.  She underwent mastectomy and lymph node dissection on 08/27/2010.  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  No invasive cancer. -She reportedly underwent radiation which was completed on 12/13/2010. -She was on letrozole which she took for 5 years. -Denies any new onset pains.  Right breast mammogram was BI-RADS Category 1 on 11/08/2019.  Left mastectomy site is within normal limits. -We will schedule her for mammogram next year and see her back after it.  2.  Osteopenia: -DEXA scan on 11/08/2019 shows T score of -2.1. -She was on Prolia until 02/16/2016. -She was counseled to take calcium and vitamin D supplements.  We will check a vitamin D level prior to next visit.

## 2019-11-15 NOTE — Progress Notes (Signed)
Deanna Henderson, Deanna Henderson [id number]   CLINIC:  Medical Oncology/Hematology  PCP:  Deanna Henderson, Deanna Henderson [phone]   REASON FOR VISIT:  Follow-up for left breast cancer and osteopenia.  CURRENT THERAPY: Observation.  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   04/16/2010 - 08/20/2010 Anti-estrogen oral therapy   Letrozole x 4 months pre-operatively in an attempt to preserve breast with lumpectomy   08/27/2010 Definitive Surgery   Left mastectomy.  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  ER 100%, PR 100%, HER2 NEGATIVE.   11/01/2010 - 12/13/2010 Radiation Therapy   Radiation therapy   12/14/2010 - 03/14/2016 Anti-estrogen oral therapy   Femara x 5 years   09/29/2015 Imaging   Bone density- BMD as determined from Femur Neck Right is 0.860 g/cm2 with a T-Score of -1.3. This patient is considered osteopenic according to [facility]) criteria.       CANCER STAGING: Cancer Staging No matching staging information was found for the patient.   INTERVAL HISTORY:  Deanna Henderson 68 y.o. female seen for follow-up of left breast cancer and osteopenia.  She reports that she has been taking calcium and vitamin D supplements.  She does not report any new onset pains.  Appetite is 100%.  Energy levels are 75%.  Chronic headaches have been stable.  Denies any recurrent infections or hospitalizations.    REVIEW OF SYSTEMS:  Review of Systems  Neurological: Positive for headaches.  All other systems reviewed and are negative.    PAST MEDICAL/SURGICAL HISTORY:  Past Medical History:  Diagnosis Date  . Balance problems   . 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 History of double vision   . Invasive ductal carcinoma of breast ([place]) 07/22/2011  . Obesity 01/22/2012  . Osteopenia 01/19/2015  . Wears partial dentures    Past Surgical History:  Procedure Laterality Date  . MASTECTOMY, PARTIAL  2011   lt mod radical mastectomy  . MULTIPLE TOOTH EXTRACTIONS    . STRABISMUS SURGERY  07/24/2012   Procedure: REPAIR STRABISMUS BILATERAL;  Surgeon: Derry Skill, MD;  Location: [place];  Service: Ophthalmology;  Laterality: Bilateral;     SOCIAL HISTORY:  Social History   Socioeconomic History  . Marital status: Single    Spouse name: Not on file  . Number of children: Not on file  . Years of education: Not on file  . Highest education level: Not on file  Occupational History  . Not on file  Social Needs  . Financial resource strain: Not on file  . Food insecurity    Worry: Not on file    Inability: Not on file  . Transportation needs    Medical: Not on file    Non-medical: Not on file  Tobacco Use  . Smoking status: Never Smoker  . Smokeless tobacco: Never Used  Substance and Sexual Activity  . Alcohol use: No  . Drug use: No  . Sexual activity: Not Currently  Lifestyle  . Physical activity    Days per week: Not on file    Minutes per  session: Not on file  . Stress: Not on file  Relationships  . Social Herbalist on phone: Not on file    Gets together: Not on file    Attends religious service: Not on file    Active member of club or organization: Not on file    Attends meetings of clubs or organizations: Not on file    Relationship status: Not on file  . Intimate partner violence    Fear of current or ex partner: Not on file    Emotionally abused: Not on file    Physically abused: Not on file    Forced sexual activity: Not on file  Other Topics Concern  . Not on file  Social History Narrative  . Not on file     FAMILY HISTORY:  Family History  Problem Relation Age of Onset  . Cancer Mother   . Cancer Father   . Hypertension Sister   . Diabetes Sister   . Cancer Sister     CURRENT MEDICATIONS:  Outpatient Encounter Medications as of 11/15/2019  Medication Sig Note  . Calcium Carbonate (CALCIUM 600 HIGH POTENCY PO) Take 600 mg by mouth 2 (two) times daily.    . Cholecalciferol (VITAMIN D3) 1000 UNITS CAPS Take by mouth daily.     Marland Kitchen donepezil (ARICEPT) 10 MG tablet Take 10 mg by mouth daily.     . fluticasone (FLONASE) 50 MCG/ACT nasal spray Place 2 sprays into both nostrils daily.   . hydrochlorothiazide (HYDRODIURIL) 25 MG tablet 25 mg daily.  01/19/2015: Received from: External Pharmacy  . levothyroxine (SYNTHROID) 75 MCG tablet Take 75 mcg by mouth daily before breakfast.   . loratadine (CLARITIN) 10 MG tablet TAKE 1 TABLET BY MOUTH DAILY FOR ALLERGIES   . Multiple Vitamins-Minerals (CVS SPECTRAVITE SENIOR PO) Take by mouth daily.     . Omega-3 Fatty Acids (FISH OIL) 1200 MG CAPS Take by mouth 3 (three) times daily.     . simvastatin (ZOCOR) 40 MG tablet Take 40 mg by mouth at bedtime.     Marland Kitchen acetaminophen (TYLENOL) 500 MG tablet Take 500 mg by mouth every 6 (six) hours as needed.   . calcium carbonate (TUMS EX) 750 MG chewable tablet Chew 1 tablet by mouth as needed.   . Corn Starch (BABY CORNSTARCH) POWD Apply topically.   . [DISCONTINUED] Calcium Carbonate Antacid (TUMS CHEWY DELIGHTS PO) Take 1 capsule by mouth as needed. Contains 1177 mg calcium carbonate   . [DISCONTINUED] levothyroxine (SYNTHROID, LEVOTHROID) 88 MCG tablet Take 75 mcg by mouth daily. Taking 75 mcg daily   . [DISCONTINUED] Phenylephrine-Acetaminophen (TYLENOL SINUS CONGESTION/PAIN) 5-325 MG TABS Take by mouth.   . [DISCONTINUED] sodium chloride (OCEAN) 0.65 % nasal spray Place 1 spray into the nose as needed.    No facility-administered encounter medications on file as of 11/15/2019.     ALLERGIES:  Allergies   Allergen Reactions  . Codeine Nausea Only     PHYSICAL EXAM:  ECOG Performance status: 2  Vitals:   11/15/19 1151  BP: (!) 110/58  Pulse: 93  Resp: 20  Temp: (!) 97.5 F (36.4 C)  SpO2: 97%   Filed Weights   11/15/19 1151  Weight: 245 lb 3.2 oz (111.2 kg)    Physical Exam Vitals signs reviewed.  Constitutional:      Appearance: Normal appearance.  Cardiovascular:     Rate and Rhythm: Normal rate and regular rhythm.     Heart sounds: Normal heart  sounds.  Pulmonary:     Effort: Pulmonary effort is normal.     Breath sounds: Normal breath sounds.  Abdominal:     General: There is no distension.     Palpations: Abdomen is soft. There is no mass.  Lymphadenopathy:     Cervical: No cervical adenopathy.  Skin:    General: Skin is warm.  Neurological:     General: No focal deficit present.     Mental Status: She is alert and oriented to person, place, and time.  Psychiatric:        Mood and Affect: Mood normal.        Behavior: Behavior normal.   Left mastectomy site has no suspicious nodules.  Right breast has no palpable masses.   LABORATORY DATA:  I have reviewed the labs as listed.  CBC    Component Value Date/Time   WBC 7.4 02/16/2016 1107   RBC 4.27 02/16/2016 1107   HGB 12.8 02/16/2016 1107   HCT 39.1 02/16/2016 1107   PLT 252 02/16/2016 1107   MCV 91.6 02/16/2016 1107   MCH 30.0 02/16/2016 1107   MCHC 32.7 02/16/2016 1107   RDW 13.0 02/16/2016 1107   LYMPHSABS 1.7 02/16/2016 1107   MONOABS 0.3 02/16/2016 1107   EOSABS 0.1 02/16/2016 1107   BASOSABS 0.0 02/16/2016 1107   CMP Latest Ref Rng & Units 08/20/2016 02/16/2016 07/20/2015  Glucose 65 - 99 mg/dL 103(H) 95 110(H)  BUN 6 - 20 mg/dL '15 14 17  '$ Creatinine 0.44 - 1.00 mg/dL 0.88 0.79 0.80  Sodium 135 - 145 mmol/L 139 142 140  Potassium 3.5 - 5.1 mmol/L 3.7 4.2 3.9  Chloride 101 - 111 mmol/L 104 107 102  CO2 22 - 32 mmol/L '24 27 25  '$ Calcium 8.9 - 10.3 mg/dL 9.5 9.5 9.8  Total Protein 6.5 - 8.1  g/dL 7.4 7.1 7.5  Total Bilirubin 0.3 - 1.2 mg/dL 0.5 0.7 0.9  Alkaline Phos 38 - 126 U/L 44 46 56  AST 15 - 41 U/L '21 21 18  '$ ALT 14 - 54 U/L 13(L) 15 14       DIAGNOSTIC IMAGING:  I have independently reviewed the scans and discussed with the patient.    ASSESSMENT & PLAN:   Invasive ductal carcinoma of breast ([place]) 1.  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 -She was given letrozole for 4 months to conserve her breast.  She underwent mastectomy and lymph node dissection on 08/27/2010.  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  No invasive cancer. -She reportedly underwent radiation which was completed on 12/13/2010. -She was on letrozole which she took for 5 years. -Denies any new onset pains.  Right breast mammogram was BI-RADS Category 1 on 11/08/2019.  Left mastectomy site is within normal limits. -We will schedule her for mammogram next year and see her back after it.  2.  Osteopenia: -DEXA scan on 11/08/2019 shows T score of -2.1. -She was on Prolia until 02/16/2016. -She was counseled to take calcium and vitamin D supplements.  We will check a vitamin D level prior to next visit.      Orders placed this encounter:  Orders Placed This Encounter  Procedures  . MM 3D SCREEN BREAST UNI RIGHT  . CBC with Differential/Platelet  . Comprehensive metabolic panel  . Vitamin D 25 hydroxy      Derek Jack, MD  [place] [phone]

## 2019-11-15 NOTE — Patient Instructions (Addendum)
[place] [facility] Discharge Instructions  You were seen today by Dr. Delton Coombes. He went over your recent test results. He will see you back in 1 year for labs, mammogram and follow up.   Thank you for choosing [place] at [facility] to provide your oncology and hematology care.  To afford each patient quality time with our provider, please arrive at least 15 minutes before your scheduled appointment time.   If you have a lab appointment with the [place] please come in thru the  Main Entrance and check in at the main information desk  You need to re-schedule your appointment should you arrive 10 or more minutes late.  We strive to give you quality time with our providers, and arriving late affects you and other patients whose appointments are after yours.  Also, if you no show three or more times for appointments you may be dismissed from the clinic at the providers discretion.     Again, thank you for choosing [facility].  Our hope is that these requests will decrease the amount of time that you wait before being seen by our physicians.       _____________________________________________________________  Should you have questions after your visit to [facility], please contact our office at [phone] between the hours of 8:00 a.m. and 4:30 p.m.  Voicemails left after 4:00 p.m. will not be returned until the following business day.  For prescription refill requests, have your pharmacy contact our office and allow 72 hours.    Cancer Center Support Programs:   > Cancer Support Group  2nd Tuesday of the month 1pm-2pm, Journey Room

## 2020-11-13 ENCOUNTER — Other Ambulatory Visit: Payer: Self-pay

## 2020-11-13 ENCOUNTER — Ambulatory Visit (HOSPITAL_COMMUNITY)
Admission: RE | Admit: 2020-11-13 | Discharge: 2020-11-13 | Disposition: A | Discharge status: Home / Self Care | Payer: Medicare Other | Source: Ambulatory Visit | Attending: Hematology | Admitting: Hematology

## 2020-11-13 ENCOUNTER — Inpatient Hospital Stay (HOSPITAL_COMMUNITY): Payer: Medicare Other | Attending: Hematology

## 2020-11-13 DIAGNOSIS — Z79899 Other long term (current) drug therapy: Secondary | ICD-10-CM | POA: Insufficient documentation

## 2020-11-13 DIAGNOSIS — M8580 Other specified disorders of bone density and structure, unspecified site: Secondary | ICD-10-CM | POA: Insufficient documentation

## 2020-11-13 DIAGNOSIS — Z923 Personal history of irradiation: Secondary | ICD-10-CM | POA: Insufficient documentation

## 2020-11-13 DIAGNOSIS — C50912 Malignant neoplasm of unspecified site of left female breast: Secondary | ICD-10-CM | POA: Insufficient documentation

## 2020-11-13 DIAGNOSIS — Z9012 Acquired absence of left breast and nipple: Secondary | ICD-10-CM | POA: Insufficient documentation

## 2020-11-13 DIAGNOSIS — Z1231 Encounter for screening mammogram for malignant neoplasm of breast: Secondary | ICD-10-CM | POA: Diagnosis present

## 2020-11-13 DIAGNOSIS — Z170 Estrogen receptor positive status [ER+]: Secondary | ICD-10-CM | POA: Diagnosis not present

## 2020-11-13 DIAGNOSIS — Z79811 Long term (current) use of aromatase inhibitors: Secondary | ICD-10-CM | POA: Insufficient documentation

## 2020-11-13 LAB — CBC WITH DIFFERENTIAL/PLATELET
Abs Immature Granulocytes: 0.02 10*3/uL (ref 0.00–0.07)
Basophils Absolute: 0 10*3/uL (ref 0.0–0.1)
Basophils Relative: 0 %
Eosinophils Absolute: 0 10*3/uL (ref 0.0–0.5)
Eosinophils Relative: 0 %
HCT: 42.5 % (ref 36.0–46.0)
Hemoglobin: 13.6 g/dL (ref 12.0–15.0)
Immature Granulocytes: 0 %
Lymphocytes Relative: 18 %
Lymphs Abs: 1.4 10*3/uL (ref 0.7–4.0)
MCH: 31.1 pg (ref 26.0–34.0)
MCHC: 32 g/dL (ref 30.0–36.0)
MCV: 97.3 fL (ref 80.0–100.0)
Monocytes Absolute: 0.4 10*3/uL (ref 0.1–1.0)
Monocytes Relative: 6 %
Neutro Abs: 5.7 10*3/uL (ref 1.7–7.7)
Neutrophils Relative %: 76 %
Platelets: 287 10*3/uL (ref 150–400)
RBC: 4.37 MIL/uL (ref 3.87–5.11)
RDW: 12.6 % (ref 11.5–15.5)
WBC: 7.5 10*3/uL (ref 4.0–10.5)
nRBC: 0 % (ref 0.0–0.2)

## 2020-11-13 LAB — COMPREHENSIVE METABOLIC PANEL
ALT: 13 U/L (ref 0–44)
AST: 20 U/L (ref 15–41)
Albumin: 4.2 g/dL (ref 3.5–5.0)
Alkaline Phosphatase: 44 U/L (ref 38–126)
Anion gap: 15 (ref 5–15)
BUN: 19 mg/dL (ref 8–23)
CO2: 21 mmol/L — ABNORMAL LOW (ref 22–32)
Calcium: 9.7 mg/dL (ref 8.9–10.3)
Chloride: 102 mmol/L (ref 98–111)
Creatinine, Ser: 0.8 mg/dL (ref 0.44–1.00)
GFR, Estimated: >60 mL/min (ref >60)
Glucose, Bld: 111 mg/dL — ABNORMAL HIGH (ref 70–99)
Potassium: 3.7 mmol/L (ref 3.5–5.1)
Sodium: 138 mmol/L (ref 135–145)
Total Bilirubin: 0.7 mg/dL (ref 0.3–1.2)
Total Protein: 7.9 g/dL (ref 6.5–8.1)

## 2020-11-13 LAB — VITAMIN D 25 HYDROXY (VIT D DEFICIENCY, FRACTURES): Vit D, 25-Hydroxy: 67.82 ng/mL (ref 30–100)

## 2020-11-16 ENCOUNTER — Other Ambulatory Visit: Payer: Self-pay

## 2020-11-16 ENCOUNTER — Inpatient Hospital Stay (HOSPITAL_COMMUNITY): Payer: Medicare Other | Attending: Hematology | Admitting: Oncology

## 2020-11-16 ENCOUNTER — Encounter (HOSPITAL_COMMUNITY): Payer: Self-pay | Admitting: Oncology

## 2020-11-16 VITALS — BP 125/72 | HR 86 | Resp 20 | Wt 241.8 lb

## 2020-11-16 DIAGNOSIS — Z170 Estrogen receptor positive status [ER+]: Secondary | ICD-10-CM | POA: Diagnosis not present

## 2020-11-16 DIAGNOSIS — E669 Obesity, unspecified: Secondary | ICD-10-CM | POA: Insufficient documentation

## 2020-11-16 DIAGNOSIS — Z833 Family history of diabetes mellitus: Secondary | ICD-10-CM | POA: Diagnosis not present

## 2020-11-16 DIAGNOSIS — Z8249 Family history of ischemic heart disease and other diseases of the circulatory system: Secondary | ICD-10-CM | POA: Insufficient documentation

## 2020-11-16 DIAGNOSIS — C50912 Malignant neoplasm of unspecified site of left female breast: Secondary | ICD-10-CM | POA: Diagnosis not present

## 2020-11-16 DIAGNOSIS — Z853 Personal history of malignant neoplasm of breast: Secondary | ICD-10-CM | POA: Insufficient documentation

## 2020-11-16 DIAGNOSIS — M8580 Other specified disorders of bone density and structure, unspecified site: Secondary | ICD-10-CM | POA: Diagnosis not present

## 2020-11-16 DIAGNOSIS — Z9012 Acquired absence of left breast and nipple: Secondary | ICD-10-CM | POA: Diagnosis not present

## 2020-11-16 DIAGNOSIS — Z923 Personal history of irradiation: Secondary | ICD-10-CM | POA: Diagnosis not present

## 2020-11-16 DIAGNOSIS — Z79899 Other long term (current) drug therapy: Secondary | ICD-10-CM | POA: Insufficient documentation

## 2020-11-16 NOTE — Progress Notes (Signed)
Deanna Henderson, [place] [address]   CLINIC:  Medical Oncology/Hematology  PCP:  Deanna Henderson, [place] [address] [phone]   REASON FOR VISIT:  Follow-up for left breast cancer and osteopenia.  CURRENT THERAPY: Observation.  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   04/16/2010 - 08/20/2010 Anti-estrogen oral therapy   Letrozole x 4 months pre-operatively in an attempt to preserve breast with lumpectomy   08/27/2010 Definitive Surgery   Left mastectomy.  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  ER 100%, PR 100%, HER2 NEGATIVE.   11/01/2010 - 12/13/2010 Radiation Therapy   Radiation therapy   12/14/2010 - 03/14/2016 Anti-estrogen oral therapy   Femara x 5 years   09/29/2015 Imaging   Bone density- BMD as determined from Femur Neck Right is 0.860 g/cm2 with a T-Score of -1.3. This patient is considered osteopenic according to [facility]) criteria.       CANCER STAGING: Cancer Staging No matching staging information was found for the patient.   INTERVAL HISTORY:  Deanna Henderson 69 y.o. female seen for follow-up of left breast cancer and osteopenia.  She reports that she has been taking calcium and vitamin D supplements.  She does not report any new onset pains.  Appetite is 100%.  Energy levels are 75%.  Chronic headaches and joint pain have been stable.  Has chronic joint pain in her hips and knees.  Uses a wheelchair to travel long distances and a cane when she is walking at home.  Denies any recurrent infections or hospitalizations.  REVIEW OF SYSTEMS:  Review of Systems  Musculoskeletal: Positive for arthralgias and myalgias.  Neurological:  Positive for headaches.  All other systems reviewed and are negative.    PAST MEDICAL/SURGICAL HISTORY:  Past Medical History:  Diagnosis Date  . Balance problems   . 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 History of double vision   . Invasive ductal carcinoma of breast ([place]) 07/22/2011  . Obesity 01/22/2012  . Osteopenia 01/19/2015  . Wears partial dentures    Past Surgical History:  Procedure Laterality Date  . MASTECTOMY, PARTIAL  2011   lt mod radical mastectomy  . MULTIPLE TOOTH EXTRACTIONS    . STRABISMUS SURGERY  07/24/2012   Procedure: REPAIR STRABISMUS BILATERAL;  Surgeon: Derry Skill, MD;  Location: [place];  Service: Ophthalmology;  Laterality: Bilateral;     SOCIAL HISTORY:  Social History   Socioeconomic History  . Marital status: Single    Spouse name: Not on file  . Number of children: Not on file  . Years of education: Not on file  . Highest education level: Not on file  Occupational History  . Not on file  Tobacco Use  . Smoking status: Never Smoker  . Smokeless tobacco: Never Used  Vaping Use  . Vaping Use: Never used  Substance and Sexual Activity  . Alcohol use: No  . Drug use: No  . Sexual activity: Not Currently  Other Topics Concern  . Not on file  Social History Narrative  . Not on file   Social Determinants of  Health   Financial Resource Strain: Low Risk   . Difficulty of Paying Living Expenses: Not hard at all  Food Insecurity: No Food Insecurity  . Worried About Charity fundraiser in the Last Year: Never true  . Ran Out of Food in the Last Year: Never true  Transportation Needs: No Transportation Needs  . Lack of Transportation (Medical): No  . Lack of Transportation (Non-Medical): No  Physical Activity: Insufficiently Active  . Days of Exercise per Week: 2 days  . Minutes of Exercise per Session: 10 min  Stress: No Stress Concern Present    . Feeling of Stress : Not at all  Social Connections: Moderately Integrated  . Frequency of Communication with Friends and Family: Three times a week  . Frequency of Social Gatherings with Friends and Family: Never  . Attends Religious Services: More than 4 times per year  . Active Member of Clubs or Organizations: Yes  . Attends Archivist Meetings: Never  . Marital Status: Never married  Intimate Partner Violence: Not At Risk  . Fear of Current or Ex-Partner: No  . Emotionally Abused: No  . Physically Abused: No  . Sexually Abused: No    FAMILY HISTORY:  Family History  Problem Relation Age of Onset  . Cancer Mother   . Cancer Father   . Hypertension Sister   . Diabetes Sister   . Cancer Sister     CURRENT MEDICATIONS:  Outpatient Encounter Medications as of 11/16/2020  Medication Sig Note  . acetaminophen (TYLENOL) 500 MG tablet Take 500 mg by mouth every 6 (six) hours as needed.   . Calcium Carbonate (CALCIUM 600 HIGH POTENCY PO) Take 600 mg by mouth 2 (two) times daily.    . calcium carbonate (TUMS EX) 750 MG chewable tablet Chew 1 tablet by mouth as needed.   . Cholecalciferol (VITAMIN D3) 1000 UNITS CAPS Take by mouth daily.     . Corn Starch (BABY CORNSTARCH) POWD Apply topically.   . donepezil (ARICEPT) 10 MG tablet Take 10 mg by mouth daily.     . fluticasone (FLONASE) 50 MCG/ACT nasal spray Place 2 sprays into both nostrils daily.   . hydrochlorothiazide (HYDRODIURIL) 25 MG tablet 25 mg daily.  01/19/2015: Received from: External Pharmacy  . levothyroxine (SYNTHROID) 88 MCG tablet Take 88 mcg by mouth daily.   Marland Kitchen loratadine (CLARITIN) 10 MG tablet TAKE 1 TABLET BY MOUTH DAILY FOR ALLERGIES   . Multiple Vitamins-Minerals ([employer]S SPECTRAVITE SENIOR PO) Take by mouth daily.     . Omega-3 Fatty Acids (FISH OIL) 1200 MG CAPS Take by mouth 3 (three) times daily.     . simvastatin (ZOCOR) 40 MG tablet Take 40 mg by mouth at bedtime.     Marland Kitchen levothyroxine (SYNTHROID) 75  MCG tablet Take 75 mcg by mouth daily before breakfast. (Patient not taking: Reported on 11/16/2020)    No facility-administered encounter medications on file as of 11/16/2020.    ALLERGIES:  Allergies  Allergen Reactions  . Codeine Nausea Only     PHYSICAL EXAM:  ECOG Performance status: 2  Vitals:   11/16/20 1036  BP: 125/72  Pulse: 86  Resp: 20  SpO2: 97%   Filed Weights   11/16/20 1036  Weight: 241 lb 13.5 oz (109.7 kg)    Physical Exam Vitals reviewed.  Constitutional:      Appearance: Normal appearance.  Cardiovascular:     Rate and Rhythm: Normal rate and regular rhythm.  Heart sounds: Normal heart sounds.  Pulmonary:     Effort: Pulmonary effort is normal.     Breath sounds: Normal breath sounds.  Abdominal:     General: There is no distension.     Palpations: Abdomen is soft. There is no mass.  Lymphadenopathy:     Cervical: No cervical adenopathy.  Skin:    General: Skin is warm.  Neurological:     General: No focal deficit present.     Mental Status: She is alert and oriented to person, place, and time.  Psychiatric:        Mood and Affect: Mood normal.        Behavior: Behavior normal.   Left mastectomy site has no suspicious nodules.  Right breast has no palpable masses.   LABORATORY DATA:  I have reviewed the labs as listed.  CBC    Component Value Date/Time   WBC 7.5 11/13/2020 1034   RBC 4.37 11/13/2020 1034   HGB 13.6 11/13/2020 1034   HCT 42.5 11/13/2020 1034   PLT 287 11/13/2020 1034   MCV 97.3 11/13/2020 1034   MCH 31.1 11/13/2020 1034   MCHC 32.0 11/13/2020 1034   RDW 12.6 11/13/2020 1034   LYMPHSABS 1.4 11/13/2020 1034   MONOABS 0.4 11/13/2020 1034   EOSABS 0.0 11/13/2020 1034   BASOSABS 0.0 11/13/2020 1034   CMP Latest Ref Rng & Units 11/13/2020 08/20/2016 02/16/2016  Glucose 70 - 99 mg/dL 111(H) 103(H) 95  BUN 8 - 23 mg/dL _0 Creatinine 0.44 - 1.00 mg/dL 0.80 0.88 0.79  Sodium 135 - 145 mmol/L 138 139 142    Potassium 3.5 - 5.1 mmol/L 3.7 3.7 4.2  Chloride 98 - 111 mmol/L 102 104 107  CO2 22 - 32 mmol/L 21(L) 24 27  Calcium 8.9 - 10.3 mg/dL 9.7 9.5 9.5  Total Protein 6.5 - 8.1 g/dL 7.9 7.4 7.1  Total Bilirubin 0.3 - 1.2 mg/dL 0.7 0.5 0.7  Alkaline Phos 38 - 126 U/L 44 44 46  AST 15 - 41 U/L _1 ALT 0 - 44 U/L 13 13(L) 15       DIAGNOSTIC IMAGING:  I have independently reviewed the scans and discussed with the patient.  ASSESSMENT & PLAN:  1.  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 -She was given letrozole for 4 months to conserve her breast.  She underwent mastectomy and lymph node dissection on 08/27/2010.  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  No invasive cancer. -She reportedly underwent radiation which was completed on 12/13/2010. -She was on letrozole which she took for 5 years. -Denies any new onset pains.  Right breast mammogram was BI-RADS Category 1 on 11/13/2020.  Left mastectomy site is within normal limits. -We will schedule her for mammogram next year and see her back after it.  2.  Osteopenia: -DEXA scan on 11/08/2019 shows T score of -2.1. -She was on Prolia until 02/16/2016. -She was counseled to continue to take calcium and vitamin D supplements.  -Vitamin D level 67.82. -We will recheck a DEXA scan prior to next appointment  Disposition: RTC in 1 year for repeat mammogram, Dexa scan, labs (CBC, CMP, Vitamin D) and assessment.   No problem-specific Assessment & Plan notes found for this encounter.   Orders placed this encounter:  Orders Placed This Encounter  Procedures  . MM 3D  SCREEN BREAST BILATERAL  . DG Bone Density    Faythe Casa, NP 11/16/2020 10:50 AM  [place] [phone]

## 2021-11-13 ENCOUNTER — Other Ambulatory Visit (HOSPITAL_COMMUNITY): Payer: Self-pay

## 2021-11-13 DIAGNOSIS — C50912 Malignant neoplasm of unspecified site of left female breast: Secondary | ICD-10-CM

## 2021-11-13 DIAGNOSIS — E559 Vitamin D deficiency, unspecified: Secondary | ICD-10-CM

## 2021-11-14 ENCOUNTER — Ambulatory Visit (HOSPITAL_COMMUNITY)
Admission: RE | Admit: 2021-11-14 | Discharge: 2021-11-14 | Disposition: A | Discharge status: Home / Self Care | Payer: Medicare Other | Source: Ambulatory Visit | Attending: Oncology | Admitting: Oncology

## 2021-11-14 ENCOUNTER — Inpatient Hospital Stay (HOSPITAL_COMMUNITY): Payer: Medicare Other | Attending: Hematology

## 2021-11-14 ENCOUNTER — Other Ambulatory Visit: Payer: Self-pay

## 2021-11-14 DIAGNOSIS — C50912 Malignant neoplasm of unspecified site of left female breast: Secondary | ICD-10-CM

## 2021-11-14 DIAGNOSIS — E559 Vitamin D deficiency, unspecified: Secondary | ICD-10-CM

## 2021-11-14 DIAGNOSIS — D500 Iron deficiency anemia secondary to blood loss (chronic): Secondary | ICD-10-CM

## 2021-11-14 DIAGNOSIS — Z780 Asymptomatic menopausal state: Secondary | ICD-10-CM | POA: Insufficient documentation

## 2021-11-14 DIAGNOSIS — M810 Age-related osteoporosis without current pathological fracture: Secondary | ICD-10-CM | POA: Diagnosis not present

## 2021-11-14 DIAGNOSIS — Z1231 Encounter for screening mammogram for malignant neoplasm of breast: Secondary | ICD-10-CM | POA: Insufficient documentation

## 2021-11-14 DIAGNOSIS — Z86000 Personal history of in-situ neoplasm of breast: Secondary | ICD-10-CM | POA: Diagnosis present

## 2021-11-14 DIAGNOSIS — Z853 Personal history of malignant neoplasm of breast: Secondary | ICD-10-CM | POA: Diagnosis not present

## 2021-11-14 DIAGNOSIS — Z13820 Encounter for screening for osteoporosis: Secondary | ICD-10-CM | POA: Insufficient documentation

## 2021-11-14 DIAGNOSIS — M8580 Other specified disorders of bone density and structure, unspecified site: Secondary | ICD-10-CM

## 2021-11-14 DIAGNOSIS — Z79899 Other long term (current) drug therapy: Secondary | ICD-10-CM | POA: Diagnosis not present

## 2021-11-14 LAB — COMPREHENSIVE METABOLIC PANEL
ALT: 12 U/L (ref 0–44)
AST: 19 U/L (ref 15–41)
Albumin: 4 g/dL (ref 3.5–5.0)
Alkaline Phosphatase: 48 U/L (ref 38–126)
Anion gap: 10 (ref 5–15)
BUN: 26 mg/dL — ABNORMAL HIGH (ref 8–23)
CO2: 21 mmol/L — ABNORMAL LOW (ref 22–32)
Calcium: 9.3 mg/dL (ref 8.9–10.3)
Chloride: 105 mmol/L (ref 98–111)
Creatinine, Ser: 0.98 mg/dL (ref 0.44–1.00)
GFR, Estimated: >60 mL/min (ref >60)
Glucose, Bld: 118 mg/dL — ABNORMAL HIGH (ref 70–99)
Potassium: 3.9 mmol/L (ref 3.5–5.1)
Sodium: 136 mmol/L (ref 135–145)
Total Bilirubin: 0.5 mg/dL (ref 0.3–1.2)
Total Protein: 7.4 g/dL (ref 6.5–8.1)

## 2021-11-14 LAB — CBC WITH DIFFERENTIAL/PLATELET
Abs Immature Granulocytes: 0.02 10*3/uL (ref 0.00–0.07)
Basophils Absolute: 0 10*3/uL (ref 0.0–0.1)
Basophils Relative: 0 %
Eosinophils Absolute: 0.1 10*3/uL (ref 0.0–0.5)
Eosinophils Relative: 1 %
HCT: 40.1 % (ref 36.0–46.0)
Hemoglobin: 13.2 g/dL (ref 12.0–15.0)
Immature Granulocytes: 0 %
Lymphocytes Relative: 18 %
Lymphs Abs: 1.3 10*3/uL (ref 0.7–4.0)
MCH: 31.7 pg (ref 26.0–34.0)
MCHC: 32.9 g/dL (ref 30.0–36.0)
MCV: 96.2 fL (ref 80.0–100.0)
Monocytes Absolute: 0.4 10*3/uL (ref 0.1–1.0)
Monocytes Relative: 6 %
Neutro Abs: 5.4 10*3/uL (ref 1.7–7.7)
Neutrophils Relative %: 75 %
RBC: 4.17 MIL/uL (ref 3.87–5.11)
RDW: 13.1 % (ref 11.5–15.5)
WBC: 7.3 10*3/uL (ref 4.0–10.5)
nRBC: 0 % (ref 0.0–0.2)

## 2021-11-14 LAB — VITAMIN D 25 HYDROXY (VIT D DEFICIENCY, FRACTURES): Vit D, 25-Hydroxy: 57.22 ng/mL (ref 30–100)

## 2021-11-28 NOTE — Progress Notes (Signed)
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   04/16/2010 - 08/20/2010 Anti-estrogen oral therapy   Letrozole x 4 months pre-operatively in an attempt to preserve breast with lumpectomy   08/27/2010 Definitive Surgery   Left mastectomy.  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  ER 100%, PR 100%, HER2 NEGATIVE.   11/01/2010 - 12/13/2010 Radiation Therapy   Radiation therapy   12/14/2010 - 03/14/2016 Anti-estrogen oral therapy   Femara x 5 years   09/29/2015 Imaging   Bone density- BMD as determined from Femur Neck Right is 0.860 g/cm2 with a T-Score of -1.3. This patient is considered osteopenic according to [facility]) criteria.      CHIEF COMPLIANT: Follow-up for left breast cancer and osteoporosis.   INTERVAL HISTORY: Ms. Deanna Henderson is a 70 y.o. female here today for follow up of her left breast cancer and osteoporosis. Her last visit was on 11/16/2020.   Today she reports feeling good. She denies sore throat.   REVIEW OF SYSTEMS:   Review of Systems  Constitutional:  Negative for appetite change and fatigue (75%).  HENT:   Negative for sore throat.   All other systems reviewed and are negative.  I have reviewed the past medical history, past surgical history, social history and family history with the patient and they are unchanged from previous note.   ALLERGIES:   is allergic to codeine.   MEDICATIONS:  Current Outpatient Medications  Medication Sig Dispense Refill   acetaminophen (TYLENOL) 500 MG tablet Take 500 mg by mouth every 6 (six) hours as needed.      albuterol (VENTOLIN HFA) 108 (90 Base) MCG/ACT inhaler PLEASE SEE ATTACHED FOR DETAILED DIRECTIONS     Calcium Carbonate (CALCIUM 600 HIGH POTENCY PO) Take 600 mg by mouth 2 (two) times daily.      calcium carbonate (TUMS EX) 750 MG chewable tablet Chew 1 tablet by mouth as needed.     Cholecalciferol (VITAMIN D3) 1000 UNITS CAPS Take by mouth daily.       Corn Starch POWD Apply topically.     donepezil (ARICEPT) 10 MG tablet Take 10 mg by mouth daily.       fluticasone (FLONASE) 50 MCG/ACT nasal spray Place 2 sprays into both nostrils daily.     hydrochlorothiazide (HYDRODIURIL) 25 MG tablet 25 mg daily.   2   levothyroxine (SYNTHROID) 75 MCG tablet Take 75 mcg by mouth daily before breakfast.     levothyroxine (SYNTHROID) 88 MCG tablet Take 88 mcg by mouth daily.     loratadine (CLARITIN) 10 MG tablet TAKE 1 TABLET BY MOUTH DAILY FOR ALLERGIES  3   Multiple Vitamins-Minerals (CVS SPECTRAVITE SENIOR PO) Take by mouth daily.       Omega-3 Fatty Acids (FISH OIL) 1200 MG CAPS Take by mouth 3 (three) times daily.       simvastatin (ZOCOR) 40 MG tablet Take 40 mg by mouth at bedtime.       No current facility-administered medications for this visit.     PHYSICAL EXAMINATION: Performance status (ECOG): 2 - Symptomatic, <50% confined to  bed  Vitals:   11/29/21 1151  BP: 134/86  Pulse: 96  Resp: 18  Temp: (!) 100.6 F (38.1 C)  SpO2: 94%   Wt Readings from Last 3 Encounters:  11/29/21 231 lb 11.3 oz (105.1 kg)  11/16/20 241 lb 13.5 oz (109.7 kg)  11/15/19 245 lb 3.2 oz (111.2 kg)   Physical Exam Vitals reviewed.  Constitutional:      Appearance: Normal appearance. She is obese.     Comments: In wheelchair  Cardiovascular:     Rate and Rhythm: Normal rate and regular rhythm.     Pulses: Normal pulses.     Heart sounds: Normal heart sounds.  Pulmonary:     Effort: Pulmonary effort is normal.     Breath sounds: Normal breath sounds.  Neurological:     General: No focal deficit  present.     Mental Status: She is alert and oriented to person, place, and time.  Psychiatric:        Mood and Affect: Mood normal.        Behavior: Behavior normal.    Breast Exam Chaperone: Thana Ates     LABORATORY DATA:  I have reviewed the data as listed CMP Latest Ref Rng & Units 11/14/2021 11/13/2020 08/20/2016  Glucose 70 - 99 mg/dL 118(H) 111(H) 103(H)  BUN 8 - 23 mg/dL 26(H) 19 15  Creatinine 0.44 - 1.00 mg/dL 0.98 0.80 0.88  Sodium 135 - 145 mmol/L 136 138 139  Potassium 3.5 - 5.1 mmol/L 3.9 3.7 3.7  Chloride 98 - 111 mmol/L 105 102 104  CO2 22 - 32 mmol/L 21(L) 21(L) 24  Calcium 8.9 - 10.3 mg/dL 9.3 9.7 9.5  Total Protein 6.5 - 8.1 g/dL 7.4 7.9 7.4  Total Bilirubin 0.3 - 1.2 mg/dL 0.5 0.7 0.5  Alkaline Phos 38 - 126 U/L 48 44 44  AST 15 - 41 U/L '19 20 21  ' ALT 0 - 44 U/L 12 13 13(L)   No results found for: IWO032 Lab Results  Component Value Date   WBC 7.3 11/14/2021   HGB 13.2 11/14/2021   HCT 40.1 11/14/2021   MCV 96.2 11/14/2021   PLT DCLMP 11/14/2021   NEUTROABS 5.4 11/14/2021    ASSESSMENT:  1.  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 -She was given letrozole for 4 months to conserve her breast.  She underwent mastectomy and lymph node dissection on 08/27/2010.  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  No invasive cancer. -She reportedly underwent radiation which was completed on 12/13/2010. -She was on letrozole which she took for 5 years.   2.  Osteoporosis: -DEXA scan on 11/08/2019 shows T score of -2.1. -She received a total of 2 doses of Prolia in 2016 and 2017, discontinued secondary to noncoverage by insurance.   PLAN:  1.  Left breast cancer, ER/PR positive and HER-2 negative: - Mammogram from 11/14/2021 of the right breast reviewed by me shows BI-RADS Category 1. - Reviewed labs from 11/14/2021 which shows  normal LFTs.  Creatinine and calcium are normal.  CBC was normal. - Left mastectomy site is within normal limits. - Recommend follow-up in 1 year with repeat mammogram and labs.    2.  Osteoporosis: - We reviewed results of DEXA scan from 11/14/2021 which showed T score -2.6. - Her bone density deteriorated to osteoporosis. - Her vitamin D level is 57. -  Recommend starting on Prolia every 6 months. - We discussed side effects in detail including rare chance of osteonecrosis of the jaw.   Breast Cancer therapy associated bone loss: I have recommended calcium, Vitamin D and weight bearing exercises.  Orders placed this encounter:  No orders of the defined types were placed in this encounter.   The patient has a good understanding of the overall plan. She agrees with it. She will call with any problems that may develop before the next visit here.  Derek Jack, MD [place] [phone]   I, Thana Ates, am acting as a scribe for Dr. Derek Jack.  I, Derek Jack MD, have reviewed the above documentation for accuracy and completeness, and I agree with the above.

## 2021-11-29 ENCOUNTER — Other Ambulatory Visit: Payer: Self-pay

## 2021-11-29 ENCOUNTER — Inpatient Hospital Stay (HOSPITAL_COMMUNITY): Payer: Medicare Other | Attending: Hematology | Admitting: Hematology

## 2021-11-29 ENCOUNTER — Inpatient Hospital Stay (HOSPITAL_COMMUNITY): Payer: Medicare Other

## 2021-11-29 VITALS — BP 134/86 | HR 96 | Temp 98.3°F | Resp 18 | Ht 63.78 in | Wt 231.7 lb

## 2021-11-29 DIAGNOSIS — M8580 Other specified disorders of bone density and structure, unspecified site: Secondary | ICD-10-CM | POA: Diagnosis not present

## 2021-11-29 DIAGNOSIS — Z1231 Encounter for screening mammogram for malignant neoplasm of breast: Secondary | ICD-10-CM | POA: Diagnosis not present

## 2021-11-29 DIAGNOSIS — M810 Age-related osteoporosis without current pathological fracture: Secondary | ICD-10-CM | POA: Diagnosis not present

## 2021-11-29 DIAGNOSIS — Z923 Personal history of irradiation: Secondary | ICD-10-CM | POA: Diagnosis not present

## 2021-11-29 DIAGNOSIS — C50912 Malignant neoplasm of unspecified site of left female breast: Secondary | ICD-10-CM

## 2021-11-29 DIAGNOSIS — Z86000 Personal history of in-situ neoplasm of breast: Secondary | ICD-10-CM | POA: Insufficient documentation

## 2021-11-29 MED ORDER — SODIUM CHLORIDE 0.9 % IV SOLN: Freq: Once | INTRAVENOUS | Status: DC

## 2021-11-29 MED ORDER — DENOSUMAB 60 MG/ML ~~LOC~~ SOSY
60.0000 mg | PREFILLED_SYRINGE | Freq: Once | SUBCUTANEOUS | Status: AC
  Administered 2021-11-29: 60 mg via SUBCUTANEOUS
  Filled 2021-11-29: qty 1

## 2021-11-29 NOTE — Progress Notes (Signed)
Prolia given today per orders. See MAR for details

## 2021-11-29 NOTE — Patient Instructions (Signed)
[place] at [facility] Discharge Instructions   You were seen and examined today by Dr. Delton Coombes. He reviewed your lab work and bone density test - your lab work is normal, but your bone density now shows osteoporosis.  We will restart you on Prolia shot every 6 months to help strengthen your bones.  Return as scheduled in 1 year.    Thank you for choosing [place] at [facility] to provide your oncology and hematology care.  To afford each patient quality time with our provider, please arrive at least 15 minutes before your scheduled appointment time.   If you have a lab appointment with the [facility] please come in thru the Main Entrance and check in at the main information desk.  You need to re-schedule your appointment should you arrive 10 or more minutes late.  We strive to give you quality time with our providers, and arriving late affects you and other patients whose appointments are after yours.  Also, if you no show three or more times for appointments you may be dismissed from the clinic at the providers discretion.     Again, thank you for choosing [facility].  Our hope is that these requests will decrease the amount of time that you wait before being seen by our physicians.       _____________________________________________________________  Should you have questions after your visit to [facility], please contact our office at [phone] and follow the prompts.  Our office hours are 8:00 a.m. and 4:30 p.m. Monday - Friday.  Please note that voicemails left after 4:00 p.m. may not be returned until the following business day.  We are closed weekends and major holidays.  You do have access to a nurse 24-7, just call the main number to the clinic [phone] and do not press any options, hold on the line and a nurse will answer the phone.    For prescription refill requests, have your pharmacy  contact our office and allow 72 hours.    Due to Covid, you will need to wear a mask upon entering the hospital. If you do not have a mask, a mask will be given to you at the Main Entrance upon arrival. For doctor visits, patients may have 1 support person age 32 or older with them. For treatment visits, patients can not have anyone with them due to social distancing guidelines and our immunocompromised population.

## 2022-05-31 ENCOUNTER — Inpatient Hospital Stay (HOSPITAL_COMMUNITY): Payer: Medicare Other | Attending: Hematology

## 2022-05-31 ENCOUNTER — Inpatient Hospital Stay (HOSPITAL_COMMUNITY): Payer: Medicare Other

## 2022-05-31 ENCOUNTER — Encounter (HOSPITAL_COMMUNITY): Payer: Self-pay

## 2022-05-31 VITALS — BP 130/62 | HR 75 | Temp 97.4°F | Resp 18

## 2022-05-31 DIAGNOSIS — M810 Age-related osteoporosis without current pathological fracture: Secondary | ICD-10-CM | POA: Diagnosis present

## 2022-05-31 DIAGNOSIS — Z9012 Acquired absence of left breast and nipple: Secondary | ICD-10-CM | POA: Insufficient documentation

## 2022-05-31 DIAGNOSIS — M8580 Other specified disorders of bone density and structure, unspecified site: Secondary | ICD-10-CM

## 2022-05-31 DIAGNOSIS — Z853 Personal history of malignant neoplasm of breast: Secondary | ICD-10-CM | POA: Insufficient documentation

## 2022-05-31 LAB — COMPREHENSIVE METABOLIC PANEL
ALT: 12 U/L (ref 0–44)
AST: 24 U/L (ref 15–41)
Albumin: 3.9 g/dL (ref 3.5–5.0)
Alkaline Phosphatase: 34 U/L — ABNORMAL LOW (ref 38–126)
Anion gap: 11 (ref 5–15)
BUN: 23 mg/dL (ref 8–23)
CO2: 26 mmol/L (ref 22–32)
Calcium: 9.5 mg/dL (ref 8.9–10.3)
Chloride: 102 mmol/L (ref 98–111)
Creatinine, Ser: 1.17 mg/dL — ABNORMAL HIGH (ref 0.44–1.00)
GFR, Estimated: 50 mL/min — ABNORMAL LOW (ref >60)
Glucose, Bld: 115 mg/dL — ABNORMAL HIGH (ref 70–99)
Potassium: 3.4 mmol/L — ABNORMAL LOW (ref 3.5–5.1)
Sodium: 139 mmol/L (ref 135–145)
Total Bilirubin: 0.7 mg/dL (ref 0.3–1.2)
Total Protein: 7.3 g/dL (ref 6.5–8.1)

## 2022-05-31 MED ORDER — DENOSUMAB 60 MG/ML ~~LOC~~ SOSY
60.0000 mg | PREFILLED_SYRINGE | Freq: Once | SUBCUTANEOUS | Status: AC
  Administered 2022-05-31: 60 mg via SUBCUTANEOUS
  Filled 2022-05-31: qty 1

## 2022-05-31 NOTE — Progress Notes (Signed)
Patient taking calcium as directed. Denied tooth, jaw, and leg pain. No recent or upcoming dental visits. Labs reviewed. Patient tolerated injection with no complaints voiced. See MAR for details. Patient stable during and after injection. Site clean and dry with no bruising or swelling noted. Band aid applied. Vss with discharge and left in satisfactory condition with no s/s of distress.  

## 2022-05-31 NOTE — Patient Instructions (Signed)
[facility]  Discharge Instructions: Thank you for choosing [facility] to provide your oncology and hematology care.  If you have a lab appointment with the [facility], please come in thru the Main Entrance and check in at the main information desk.  Wear comfortable clothing and clothing appropriate for easy access to any Portacath or PICC line.   We strive to give you quality time with your provider. You may need to reschedule your appointment if you arrive late (15 or more minutes).  Arriving late affects you and other patients whose appointments are after yours.  Also, if you miss three or more appointments without notifying the office, you may be dismissed from the clinic at the provider's discretion.      For prescription refill requests, have your pharmacy contact our office and allow 72 hours for refills to be completed.    Today you received the following chemotherapy and/or immunotherapy agents Prolia, return as scheduled. Denosumab injection What is this medication? DENOSUMAB (den oh sue mab) slows bone breakdown. Prolia is used to treat osteoporosis in women after menopause and in men, and in people who are taking corticosteroids for 6 months or more. Delton See is used to treat a high calcium level due to cancer and to prevent bone fractures and other bone problems caused by multiple myeloma or cancer bone metastases. Delton See is also used to treat giant cell tumor of the bone. This medicine may be used for other purposes; ask your health care provider or pharmacist if you have questions. COMMON BRAND NAME(S): Prolia, XGEVA What should I tell my care team before I take this medication? They need to know if you have any of these conditions: dental disease having surgery or tooth extraction infection kidney disease low levels of calcium or Vitamin D in the blood malnutrition on hemodialysis skin conditions or sensitivity thyroid or parathyroid disease an  unusual reaction to denosumab, other medicines, foods, dyes, or preservatives pregnant or trying to get pregnant breast-feeding How should I use this medication? This medicine is for injection under the skin. It is given by a health care professional in a hospital or clinic setting. A special MedGuide will be given to you before each treatment. Be sure to read this information carefully each time. For Prolia, talk to your pediatrician regarding the use of this medicine in children. Special care may be needed. For Delton See, talk to your pediatrician regarding the use of this medicine in children. While this drug may be prescribed for children as young as 13 years for selected conditions, precautions do apply. Overdosage: If you think you have taken too much of this medicine contact a poison control center or emergency room at once. NOTE: This medicine is only for you. Do not share this medicine with others. What if I miss a dose? It is important not to miss your dose. Call your doctor or health care professional if you are unable to keep an appointment. What may interact with this medication? Do not take this medicine with any of the following medications: other medicines containing denosumab This medicine may also interact with the following medications: medicines that lower your chance of fighting infection steroid medicines like prednisone or cortisone This list may not describe all possible interactions. Give your health care provider a list of all the medicines, herbs, non-prescription drugs, or dietary supplements you use. Also tell them if you smoke, drink alcohol, or use illegal drugs. Some items may interact with your medicine.  What should I watch for while using this medication? Visit your doctor or health care professional for regular checks on your progress. Your doctor or health care professional may order blood tests and other tests to see how you are doing. Call your doctor or health  care professional for advice if you get a fever, chills or sore throat, or other symptoms of a cold or flu. Do not treat yourself. This drug may decrease your body's ability to fight infection. Try to avoid being around people who are sick. You should make sure you get enough calcium and vitamin D while you are taking this medicine, unless your doctor tells you not to. Discuss the foods you eat and the vitamins you take with your health care professional. See your dentist regularly. Brush and floss your teeth as directed. Before you have any dental work done, tell your dentist you are receiving this medicine. Do not become pregnant while taking this medicine or for 5 months after stopping it. Talk with your doctor or health care professional about your birth control options while taking this medicine. Women should inform their doctor if they wish to become pregnant or think they might be pregnant. There is a potential for serious side effects to an unborn child. Talk to your health care professional or pharmacist for more information. What side effects may I notice from receiving this medication? Side effects that you should report to your doctor or health care professional as soon as possible: allergic reactions like skin rash, itching or hives, swelling of the face, lips, or tongue bone pain breathing problems dizziness jaw pain, especially after dental work redness, blistering, peeling of the skin signs and symptoms of infection like fever or chills; cough; sore throat; pain or trouble passing urine signs of low calcium like fast heartbeat, muscle cramps or muscle pain; pain, tingling, numbness in the hands or feet; seizures unusual bleeding or bruising unusually weak or tired Side effects that usually do not require medical attention (report to your doctor or health care professional if they continue or are bothersome): constipation diarrhea headache joint pain loss of appetite muscle  pain runny nose tiredness upset stomach This list may not describe all possible side effects. Call your doctor for medical advice about side effects. You may report side effects to FDA at 1-800-FDA-1088. Where should I keep my medication? This medicine is only given in a clinic, doctor's office, or other health care setting and will not be stored at home. NOTE: This sheet is a summary. It may not cover all possible information. If you have questions about this medicine, talk to your doctor, pharmacist, or health care provider.  2023 Elsevier/Gold Standard (2018-04-10 00:00:00)    To help prevent nausea and vomiting after your treatment, we encourage you to take your nausea medication as directed.  BELOW ARE SYMPTOMS THAT SHOULD BE REPORTED IMMEDIATELY: *FEVER GREATER THAN 100.4 F (38 C) OR HIGHER *CHILLS OR SWEATING *NAUSEA AND VOMITING THAT IS NOT CONTROLLED WITH YOUR NAUSEA MEDICATION *UNUSUAL SHORTNESS OF BREATH *UNUSUAL BRUISING OR BLEEDING *URINARY PROBLEMS (pain or burning when urinating, or frequent urination) *BOWEL PROBLEMS (unusual diarrhea, constipation, pain near the anus) TENDERNESS IN MOUTH AND THROAT WITH OR WITHOUT PRESENCE OF ULCERS (sore throat, sores in mouth, or a toothache) UNUSUAL RASH, SWELLING OR PAIN  UNUSUAL VAGINAL DISCHARGE OR ITCHING   Items with * indicate a potential emergency and should be followed up as soon as possible or go to the Emergency Department if any problems  should occur.  Please show the CHEMOTHERAPY ALERT CARD or IMMUNOTHERAPY ALERT CARD at check-in to the Emergency Department and triage nurse.  Should you have questions after your visit or need to cancel or reschedule your appointment, please contact [facility] [phone]  and follow the prompts.  Office hours are 8:00 a.m. to 4:30 p.m. Monday - Friday. Please note that voicemails left after 4:00 p.m. may not be returned until the following business day.  We are closed  weekends and major holidays. You have access to a nurse at all times for urgent questions. Please call the main number to the clinic [phone] and follow the prompts.  For any non-urgent questions, you may also contact your provider using MyChart. We now offer e-Visits for anyone 43 and older to request care online for non-urgent symptoms. For details visit [email or web address].   Also download the MyChart app! Go to the app store, search "MyChart", open the app, select , and log in with your MyChart username and password.  Masks are optional in the cancer centers. If you would like for your care team to wear a mask while they are taking care of you, please let them know. For doctor visits, patients may have with them one support person who is at least 73 years old. At this time, visitors are not allowed in the infusion area.

## 2022-11-15 ENCOUNTER — Inpatient Hospital Stay: Payer: Medicare Other | Attending: Hematology

## 2022-11-15 ENCOUNTER — Ambulatory Visit (HOSPITAL_COMMUNITY)
Admission: RE | Admit: 2022-11-15 | Discharge: 2022-11-15 | Disposition: A | Discharge status: Home / Self Care | Payer: Medicare Other | Source: Ambulatory Visit | Attending: Hematology | Admitting: Hematology

## 2022-11-15 DIAGNOSIS — Z853 Personal history of malignant neoplasm of breast: Secondary | ICD-10-CM | POA: Insufficient documentation

## 2022-11-15 DIAGNOSIS — Z923 Personal history of irradiation: Secondary | ICD-10-CM | POA: Diagnosis not present

## 2022-11-15 DIAGNOSIS — Z9012 Acquired absence of left breast and nipple: Secondary | ICD-10-CM | POA: Diagnosis not present

## 2022-11-15 DIAGNOSIS — Z1231 Encounter for screening mammogram for malignant neoplasm of breast: Secondary | ICD-10-CM | POA: Insufficient documentation

## 2022-11-15 DIAGNOSIS — Z79899 Other long term (current) drug therapy: Secondary | ICD-10-CM | POA: Diagnosis not present

## 2022-11-15 DIAGNOSIS — C50912 Malignant neoplasm of unspecified site of left female breast: Secondary | ICD-10-CM

## 2022-11-15 DIAGNOSIS — M810 Age-related osteoporosis without current pathological fracture: Secondary | ICD-10-CM | POA: Insufficient documentation

## 2022-11-15 DIAGNOSIS — M8580 Other specified disorders of bone density and structure, unspecified site: Secondary | ICD-10-CM

## 2022-11-15 LAB — CBC WITH DIFFERENTIAL/PLATELET
Abs Immature Granulocytes: 0.02 10*3/uL (ref 0.00–0.07)
Basophils Absolute: 0 10*3/uL (ref 0.0–0.1)
Basophils Relative: 0 %
Eosinophils Absolute: 0.1 10*3/uL (ref 0.0–0.5)
Eosinophils Relative: 1 %
HCT: 39.4 % (ref 36.0–46.0)
Hemoglobin: 12.9 g/dL (ref 12.0–15.0)
Immature Granulocytes: 0 %
Lymphocytes Relative: 20 %
Lymphs Abs: 1.6 10*3/uL (ref 0.7–4.0)
MCH: 30.6 pg (ref 26.0–34.0)
MCHC: 32.7 g/dL (ref 30.0–36.0)
MCV: 93.6 fL (ref 80.0–100.0)
Monocytes Absolute: 0.6 10*3/uL (ref 0.1–1.0)
Monocytes Relative: 7 %
Neutro Abs: 6 10*3/uL (ref 1.7–7.7)
Neutrophils Relative %: 72 %
Platelets: 252 10*3/uL (ref 150–400)
RBC: 4.21 MIL/uL (ref 3.87–5.11)
RDW: 13.2 % (ref 11.5–15.5)
WBC: 8.3 10*3/uL (ref 4.0–10.5)
nRBC: 0 % (ref 0.0–0.2)

## 2022-11-15 LAB — COMPREHENSIVE METABOLIC PANEL
ALT: 10 U/L (ref 0–44)
AST: 20 U/L (ref 15–41)
Albumin: 4.1 g/dL (ref 3.5–5.0)
Alkaline Phosphatase: 33 U/L — ABNORMAL LOW (ref 38–126)
Anion gap: 13 (ref 5–15)
BUN: 22 mg/dL (ref 8–23)
CO2: 22 mmol/L (ref 22–32)
Calcium: 9.6 mg/dL (ref 8.9–10.3)
Chloride: 104 mmol/L (ref 98–111)
Creatinine, Ser: 0.89 mg/dL (ref 0.44–1.00)
GFR, Estimated: >60 mL/min (ref >60)
Glucose, Bld: 102 mg/dL — ABNORMAL HIGH (ref 70–99)
Potassium: 4.3 mmol/L (ref 3.5–5.1)
Sodium: 139 mmol/L (ref 135–145)
Total Bilirubin: 0.7 mg/dL (ref 0.3–1.2)
Total Protein: 7.6 g/dL (ref 6.5–8.1)

## 2022-11-15 LAB — VITAMIN D 25 HYDROXY (VIT D DEFICIENCY, FRACTURES): Vit D, 25-Hydroxy: 95.19 ng/mL (ref 30–100)

## 2022-11-21 ENCOUNTER — Inpatient Hospital Stay (HOSPITAL_BASED_OUTPATIENT_CLINIC_OR_DEPARTMENT_OTHER): Payer: Medicare Other | Admitting: Hematology

## 2022-11-21 ENCOUNTER — Inpatient Hospital Stay: Payer: Medicare Other

## 2022-11-21 VITALS — BP 120/59 | HR 105 | Temp 97.8°F | Resp 16 | Wt 213.0 lb

## 2022-11-21 DIAGNOSIS — C50912 Malignant neoplasm of unspecified site of left female breast: Secondary | ICD-10-CM | POA: Diagnosis not present

## 2022-11-21 DIAGNOSIS — Z1231 Encounter for screening mammogram for malignant neoplasm of breast: Secondary | ICD-10-CM | POA: Diagnosis not present

## 2022-11-21 DIAGNOSIS — Z853 Personal history of malignant neoplasm of breast: Secondary | ICD-10-CM | POA: Diagnosis not present

## 2022-11-21 DIAGNOSIS — E559 Vitamin D deficiency, unspecified: Secondary | ICD-10-CM

## 2022-11-21 NOTE — Progress Notes (Signed)
No injection per oncologist.

## 2022-11-21 NOTE — Patient Instructions (Signed)
[place] at [facility] Discharge Instructions   You were seen and examined today by Dr. Delton Coombes.  He reviewed the results of your lab work which are normal/stable.   He reviewed the results of your mammogram which was normal.   We will hold Prolia injection today due to your upcoming dental work.  We will plan to give again in June.   Thank you for choosing [place] at [facility] to provide your oncology and hematology care.  To afford each patient quality time with our provider, please arrive at least 15 minutes before your scheduled appointment time.   If you have a lab appointment with the [facility] please come in thru the Main Entrance and check in at the main information desk.  You need to re-schedule your appointment should you arrive 10 or more minutes late.  We strive to give you quality time with our providers, and arriving late affects you and other patients whose appointments are after yours.  Also, if you no show three or more times for appointments you may be dismissed from the clinic at the providers discretion.     Again, thank you for choosing [facility].  Our hope is that these requests will decrease the amount of time that you wait before being seen by our physicians.       _____________________________________________________________  Should you have questions after your visit to [facility], please contact our office at [phone] and follow the prompts.  Our office hours are 8:00 a.m. and 4:30 p.m. Monday - Friday.  Please note that voicemails left after 4:00 p.m. may not be returned until the following business day.  We are closed weekends and major holidays.  You do have access to a nurse 24-7, just call the main number to the clinic [phone] and do not press any options, hold on the line and a nurse will answer the phone.    For prescription refill requests, have your pharmacy  contact our office and allow 72 hours.    Due to Covid, you will need to wear a mask upon entering the hospital. If you do not have a mask, a mask will be given to you at the Main Entrance upon arrival. For doctor visits, patients may have 1 support person age 74 or older with them. For treatment visits, patients can not have anyone with them due to social distancing guidelines and our immunocompromised population.

## 2022-11-21 NOTE — Progress Notes (Signed)
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   04/16/2010 - 08/20/2010 Anti-estrogen oral therapy   Letrozole x 4 months pre-operatively in an attempt to preserve breast with lumpectomy   08/27/2010 Definitive Surgery   Left mastectomy.  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  ER 100%, PR 100%, HER2 NEGATIVE.   11/01/2010 - 12/13/2010 Radiation Therapy   Radiation therapy   12/14/2010 - 03/14/2016 Anti-estrogen oral therapy   Femara x 5 years   09/29/2015 Imaging   Bone density- BMD as determined from Femur Neck Right is 0.860 g/cm2 with a T-Score of -1.3. This patient is considered osteopenic according to [facility]) criteria.      CHIEF COMPLIANT: Follow-up for left breast cancer and osteoporosis.   INTERVAL HISTORY: Ms. Deanna Henderson is a 71 y.o. female seen for follow-up of left breast cancer and osteoporosis.  She denies any new onset pains.  Energy levels are 73%.  Reports that she is having dental extraction done in March.  She is taking vitamin D 4000 units daily.  REVIEW OF SYSTEMS:   Review of Systems  Constitutional:  Negative for appetite change and fatigue (73%).  HENT:   Negative for sore throat.   All other systems reviewed and are negative.   I have reviewed the past medical history, past surgical history, social history and family history with the patient and they are unchanged from previous note.   ALLERGIES:   is allergic to codeine.   MEDICATIONS:  Current Outpatient Medications  Medication Sig Dispense Refill   acetaminophen (TYLENOL) 500 MG tablet  Take 500 mg by mouth every 6 (six) hours as needed.     albuterol (VENTOLIN HFA) 108 (90 Base) MCG/ACT inhaler PLEASE SEE ATTACHED FOR DETAILED DIRECTIONS     Calcium Carbonate (CALCIUM 600 HIGH POTENCY PO) Take 600 mg by mouth 2 (two) times daily.      calcium carbonate (TUMS EX) 750 MG chewable tablet Chew 1 tablet by mouth as needed.     Cholecalciferol (VITAMIN D3) 1000 UNITS CAPS Take by mouth daily.       Corn Starch POWD Apply topically.     donepezil (ARICEPT) 10 MG tablet Take 10 mg by mouth daily.       fluticasone (FLONASE) 50 MCG/ACT nasal spray Place 2 sprays into both nostrils daily.     hydrochlorothiazide (HYDRODIURIL) 25 MG tablet 25 mg daily.   2   levothyroxine (SYNTHROID) 75 MCG tablet Take 75 mcg by mouth daily before breakfast.     loratadine (CLARITIN) 10 MG tablet TAKE 1 TABLET BY MOUTH DAILY FOR ALLERGIES  3   Multiple Vitamins-Minerals ([employer] SPECTRAVITE SENIOR PO) Take by mouth daily.       Omega-3 Fatty Acids (FISH OIL) 1200 MG CAPS Take by mouth 3 (three) times daily.       simvastatin (ZOCOR) 40 MG tablet Take 40 mg by mouth at bedtime.       No current facility-administered medications for this visit.     PHYSICAL EXAMINATION: Performance status (ECOG): 2 - Symptomatic, <50% confined to bed  Vitals:  11/21/22 1202  BP: (!) 120/59  Pulse: (!) 105  Resp: 16  Temp: 97.8 F (36.6 C)  SpO2: 97%   Wt Readings from Last 3 Encounters:  11/21/22 213 lb (96.6 kg)  11/29/21 231 lb 11.3 oz (105.1 kg)  11/16/20 241 lb 13.5 oz (109.7 kg)   Physical Exam Vitals reviewed.  Constitutional:      Appearance: Normal appearance. She is obese.     Comments: In wheelchair  Cardiovascular:     Rate and Rhythm: Normal rate and regular rhythm.     Pulses: Normal pulses.     Heart sounds: Normal heart sounds.  Pulmonary:     Effort: Pulmonary effort is normal.     Breath sounds: Normal breath sounds.  Neurological:     General: No focal deficit present.     Mental  Status: She is alert and oriented to person, place, and time.  Psychiatric:        Mood and Affect: Mood normal.        Behavior: Behavior normal.     Breast Exam Chaperone: Thana Ates     LABORATORY DATA:  I have reviewed the data as listed    Latest Ref Rng & Units 11/15/2022   10:59 AM 05/31/2022   10:12 AM 11/14/2021   10:28 AM  CMP  Glucose 70 - 99 mg/dL 102  115  118   BUN 8 - 23 mg/dL _0 Creatinine 0.44 - 1.00 mg/dL 0.89  1.17  0.98   Sodium 135 - 145 mmol/L 139  139  136   Potassium 3.5 - 5.1 mmol/L 4.3  3.4  3.9   Chloride 98 - 111 mmol/L 104  102  105   CO2 22 - 32 mmol/L _1 Calcium 8.9 - 10.3 mg/dL 9.6  9.5  9.3   Total Protein 6.5 - 8.1 g/dL 7.6  7.3  7.4   Total Bilirubin 0.3 - 1.2 mg/dL 0.7  0.7  0.5   Alkaline Phos 38 - 126 U/L 33  34  48   AST 15 - 41 U/L _2 ALT 0 - 44 U/L _3 No results found for: "CAN153" Lab Results  Component Value Date   WBC 8.3 11/15/2022   HGB 12.9 11/15/2022   HCT 39.4 11/15/2022   MCV 93.6 11/15/2022   PLT 252 11/15/2022   NEUTROABS 6.0 11/15/2022    ASSESSMENT:  1.  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 -She was given letrozole for 4 months to conserve her breast.  She underwent mastectomy and lymph node dissection on 08/27/2010.  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  No invasive cancer. -She reportedly underwent radiation which was completed on 12/13/2010. -She was on letrozole which she took for 5 years.   2.  Osteoporosis: -DEXA scan on 11/08/2019 shows T score of -2.1. -She received a total of 2 doses of Prolia in 2016 and 2017, discontinued secondary to noncoverage by insurance. - DEXA scan on 11/14/2021 with T-score -2.6.  Prolia started back on 05/31/2022.   PLAN:  1.  Left breast cancer, ER/PR positive and HER-2 negative: - Left  mastectomy site is within normal limits with no palpable adenopathy or masses. - Mammogram of the right breast on 11/15/2022 was  BI-RADS Category 1. - Reviewed labs which showed normal LFTs and CBC. - RTC 1 year for follow-up with repeat mammogram.    2.  Osteoporosis: - Prolia started back on 05/31/2022. - She has tolerated well.  She reportedly has to have dental extraction in March.  We will hold her today's dose.  She will come back in June for next dose. - Vitamin D level is 95.  She is taking vitamin D 4000 units daily.  Recommend cutting back vitamin D to 2000 units daily.   Breast Cancer therapy associated bone loss: I have recommended calcium, Vitamin D and weight bearing exercises.  Orders placed this encounter:  Orders Placed This Encounter  Procedures   MM 3D SCREEN BREAST UNI RIGHT   CBC with Differential   Comprehensive metabolic panel   VITAMIN D 25 Hydroxy (Vit-D Deficiency, Fractures)   Comprehensive metabolic panel     The patient has a good understanding of the overall plan. She agrees with it. She will call with any problems that may develop before the next visit here.  Derek Jack, MD [place] [phone]   I, Thana Ates, am acting as a scribe for Dr. Derek Jack.  I, Derek Jack MD, have reviewed the above documentation for accuracy and completeness, and I agree with the above.

## 2023-05-28 ENCOUNTER — Emergency Department (HOSPITAL_COMMUNITY): Payer: Medicare Other

## 2023-05-28 ENCOUNTER — Other Ambulatory Visit: Payer: Self-pay

## 2023-05-28 ENCOUNTER — Inpatient Hospital Stay (HOSPITAL_COMMUNITY)
Admission: EM | Admit: 2023-05-28 | Discharge: 2023-06-01 | DRG: 640 | Disposition: A | Discharge status: Skilled Nursing Facility | Payer: Medicare Other | Attending: Internal Medicine | Admitting: Internal Medicine

## 2023-05-28 ENCOUNTER — Encounter (HOSPITAL_COMMUNITY): Payer: Self-pay

## 2023-05-28 DIAGNOSIS — R748 Abnormal levels of other serum enzymes: Secondary | ICD-10-CM | POA: Diagnosis present

## 2023-05-28 DIAGNOSIS — M8580 Other specified disorders of bone density and structure, unspecified site: Secondary | ICD-10-CM | POA: Diagnosis present

## 2023-05-28 DIAGNOSIS — W19XXXA Unspecified fall, initial encounter: Principal | ICD-10-CM

## 2023-05-28 DIAGNOSIS — E785 Hyperlipidemia, unspecified: Secondary | ICD-10-CM | POA: Diagnosis present

## 2023-05-28 DIAGNOSIS — E860 Dehydration: Principal | ICD-10-CM | POA: Diagnosis present

## 2023-05-28 DIAGNOSIS — G9341 Metabolic encephalopathy: Secondary | ICD-10-CM | POA: Diagnosis present

## 2023-05-28 DIAGNOSIS — Z9012 Acquired absence of left breast and nipple: Secondary | ICD-10-CM

## 2023-05-28 DIAGNOSIS — Z79890 Hormone replacement therapy (postmenopausal): Secondary | ICD-10-CM

## 2023-05-28 DIAGNOSIS — Z885 Allergy status to narcotic agent status: Secondary | ICD-10-CM

## 2023-05-28 DIAGNOSIS — M170 Bilateral primary osteoarthritis of knee: Secondary | ICD-10-CM | POA: Diagnosis present

## 2023-05-28 DIAGNOSIS — R531 Weakness: Secondary | ICD-10-CM

## 2023-05-28 DIAGNOSIS — F0390 Unspecified dementia without behavioral disturbance: Secondary | ICD-10-CM | POA: Diagnosis present

## 2023-05-28 DIAGNOSIS — N3001 Acute cystitis with hematuria: Secondary | ICD-10-CM

## 2023-05-28 DIAGNOSIS — Z8249 Family history of ischemic heart disease and other diseases of the circulatory system: Secondary | ICD-10-CM

## 2023-05-28 DIAGNOSIS — B9620 Unspecified Escherichia coli [E. coli] as the cause of diseases classified elsewhere: Secondary | ICD-10-CM | POA: Diagnosis present

## 2023-05-28 DIAGNOSIS — Z79899 Other long term (current) drug therapy: Secondary | ICD-10-CM

## 2023-05-28 DIAGNOSIS — N390 Urinary tract infection, site not specified: Secondary | ICD-10-CM | POA: Diagnosis present

## 2023-05-28 DIAGNOSIS — E876 Hypokalemia: Secondary | ICD-10-CM | POA: Diagnosis present

## 2023-05-28 DIAGNOSIS — Z853 Personal history of malignant neoplasm of breast: Secondary | ICD-10-CM

## 2023-05-28 DIAGNOSIS — S0083XA Contusion of other part of head, initial encounter: Secondary | ICD-10-CM | POA: Diagnosis present

## 2023-05-28 DIAGNOSIS — M25561 Pain in right knee: Secondary | ICD-10-CM | POA: Diagnosis present

## 2023-05-28 DIAGNOSIS — E039 Hypothyroidism, unspecified: Secondary | ICD-10-CM | POA: Diagnosis present

## 2023-05-28 DIAGNOSIS — W010XXA Fall on same level from slipping, tripping and stumbling without subsequent striking against object, initial encounter: Secondary | ICD-10-CM | POA: Diagnosis present

## 2023-05-28 LAB — CBC WITH DIFFERENTIAL/PLATELET
Abs Immature Granulocytes: 0.07 K/uL (ref 0.00–0.07)
Basophils Absolute: 0 K/uL (ref 0.0–0.1)
Basophils Relative: 0 %
Eosinophils Absolute: 0 K/uL (ref 0.0–0.5)
Eosinophils Relative: 0 %
HCT: 42.5 % (ref 36.0–46.0)
Hemoglobin: 14.2 g/dL (ref 12.0–15.0)
Immature Granulocytes: 1 %
Lymphocytes Relative: 3 %
Lymphs Abs: 0.5 K/uL — ABNORMAL LOW (ref 0.7–4.0)
MCH: 32.9 pg (ref 26.0–34.0)
MCHC: 33.4 g/dL (ref 30.0–36.0)
MCV: 98.6 fL (ref 80.0–100.0)
Monocytes Absolute: 0.8 K/uL (ref 0.1–1.0)
Monocytes Relative: 6 %
Neutro Abs: 13.6 K/uL — ABNORMAL HIGH (ref 1.7–7.7)
Neutrophils Relative %: 90 %
RBC: 4.31 MIL/uL (ref 3.87–5.11)
RDW: 14.1 % (ref 11.5–15.5)
Smear Review: ADEQUATE
WBC: 15 K/uL — ABNORMAL HIGH (ref 4.0–10.5)
nRBC: 0 % (ref 0.0–0.2)

## 2023-05-28 LAB — COMPREHENSIVE METABOLIC PANEL
ALT: 28 U/L (ref 0–44)
AST: 61 U/L — ABNORMAL HIGH (ref 15–41)
Albumin: 3 g/dL — ABNORMAL LOW (ref 3.5–5.0)
Alkaline Phosphatase: 47 U/L (ref 38–126)
Anion gap: 19 — ABNORMAL HIGH (ref 5–15)
BUN: 32 mg/dL — ABNORMAL HIGH (ref 8–23)
CO2: 21 mmol/L — ABNORMAL LOW (ref 22–32)
Calcium: 9.7 mg/dL (ref 8.9–10.3)
Chloride: 100 mmol/L (ref 98–111)
Creatinine, Ser: 0.95 mg/dL (ref 0.44–1.00)
GFR, Estimated: >60 mL/min (ref >60)
Glucose, Bld: 136 mg/dL — ABNORMAL HIGH (ref 70–99)
Potassium: 3.9 mmol/L (ref 3.5–5.1)
Sodium: 140 mmol/L (ref 135–145)
Total Bilirubin: 1.5 mg/dL — ABNORMAL HIGH (ref 0.3–1.2)
Total Protein: 6.8 g/dL (ref 6.5–8.1)

## 2023-05-28 LAB — CK: Total CK: 642 U/L — ABNORMAL HIGH (ref 38–234)

## 2023-05-28 NOTE — ED Provider Notes (Signed)
[place] EMERGENCY DEPARTMENT AT [facility] Provider Note   CSN: [id number] Arrival date & time: 05/28/23  1734     History  Chief Complaint  Patient presents with   Marletta Lor    Deanna Henderson is a 72 y.o. female with a history including balance issues, osteopenia and degenerative joint disease presenting for evaluation after she sustained a fall Sunday evening, describing she lost her balance and fell forward hitting her face on the floor.  She denies LOC   She fell in her home and was unable to get off the floor.  Patient lives alone and she did not have a phone nearby in order for her to call for help.  A neighbor heard her calling for help and called 911.  Patient was on the floor from Sunday evening until today.  She does state that she did a lot of rolling and wiggling trying to get up so she did not stay in 1 position the entire time.  She has complaints of facial pain and swelling around her left eye, denies globe pain and has no vision change in that eye.  She also has complaints of pain in her right knee and sustained rug burn with her fall.  CBC of this knee.  Additionally she reports bilateral foot pain, she has significant bruising around her dorsal right great toe but she states this injury actually was present before Sundays fall.  She has had no treatment prior to arrival.  The history is provided by the patient.       Home Medications Prior to Admission medications   Medication Sig Start Date End Date Taking? Authorizing Provider  acetaminophen (TYLENOL) 500 MG tablet Take 500 mg by mouth every 6 (six) hours as needed.    [provider]  albuterol (VENTOLIN HFA) 108 (90 Base) MCG/ACT inhaler PLEASE SEE ATTACHED FOR DETAILED DIRECTIONS 10/28/21   [provider]  Calcium Carbonate (CALCIUM 600 HIGH POTENCY PO) Take 600 mg by mouth 2 (two) times daily.     [provider]  calcium carbonate (TUMS EX) 750 MG chewable tablet Chew 1 tablet  by mouth as needed.    [provider]  Cholecalciferol (VITAMIN D3) 1000 UNITS CAPS Take by mouth daily.      [provider]  Corn Starch POWD Apply topically.    [provider]  donepezil (ARICEPT) 10 MG tablet Take 10 mg by mouth daily.      [provider]  fluticasone (FLONASE) 50 MCG/ACT nasal spray Place 2 sprays into both nostrils daily.    [provider]  hydrochlorothiazide (HYDRODIURIL) 25 MG tablet 25 mg daily.  12/17/14   [provider]  levothyroxine (SYNTHROID) 75 MCG tablet Take 75 mcg by mouth daily before breakfast.    [provider]  loratadine (CLARITIN) 10 MG tablet TAKE 1 TABLET BY MOUTH DAILY FOR ALLERGIES 10/22/18   [provider]  Multiple Vitamins-Minerals ([employer] SPECTRAVITE SENIOR PO) Take by mouth daily.      [provider]  Omega-3 Fatty Acids (FISH OIL) 1200 MG CAPS Take by mouth 3 (three) times daily.      [provider]  simvastatin (ZOCOR) 40 MG tablet Take 40 mg by mouth at bedtime.      [provider]      Allergies    Codeine    Review of Systems   Review of Systems  Constitutional:  Negative for chills and fever.  HENT:  Positive for facial swelling. Negative for congestion and sore throat.   Eyes: Negative.   Respiratory:  Negative for chest tightness and shortness of breath.   Cardiovascular:  Negative for chest pain.  Gastrointestinal:  Negative for abdominal pain, nausea and vomiting.  Genitourinary: Negative.   Musculoskeletal:  Positive for arthralgias. Negative for joint swelling and neck pain.  Skin:  Positive for color change and wound. Negative for rash.       Left great nail plate avulsed.  Neurological:  Negative for dizziness, weakness, light-headedness, numbness and headaches.  Psychiatric/Behavioral: Negative.    All other systems reviewed and are negative.   Physical Exam Updated Vital Signs BP 121/64   Pulse 76   Temp (!)  97.4 F (36.3 C) (Oral)   Resp 20   SpO2 98%  Physical Exam Vitals and nursing note reviewed.  Constitutional:      Appearance: She is well-developed.  HENT:     Head: Normocephalic.     Comments: Moderate edema left periorbital and forehead.    Nose: No rhinorrhea.     Mouth/Throat:     Mouth: Mucous membranes are dry.  Eyes:     Extraocular Movements: Extraocular movements intact.     Conjunctiva/sclera: Conjunctivae normal.     Pupils: Pupils are equal, round, and reactive to light.  Cardiovascular:     Rate and Rhythm: Normal rate and regular rhythm.     Heart sounds: Normal heart sounds.  Pulmonary:     Effort: Pulmonary effort is normal.     Breath sounds: Normal breath sounds. No wheezing.  Abdominal:     General: Bowel sounds are normal.     Palpations: Abdomen is soft.     Tenderness: There is no abdominal tenderness. There is no guarding.  Musculoskeletal:        General: Swelling present. No deformity. Normal range of motion.     Cervical back: Normal range of motion.     Comments: Significant bruising and swelling of the right dorsal foot into her great toe.  Skin:    General: Skin is warm and dry.     Findings: Bruising present.     Comments: Dried skin Across nasal bridge.  Left great toenail mostly avulsed, hanging  by a sliver of cuticle which was easily removed.  A line of clear fluid filled vesicles left dorsal arm without erythema or pain,  ? Friction injury.    Neurological:     General: No focal deficit present.     Mental Status: She is alert and oriented to person, place, and time.  Psychiatric:        Mood and Affect: Mood normal.     ED Results / Procedures / Treatments   Labs (all labs ordered are listed, but only abnormal results are displayed) Labs Reviewed  CBC WITH DIFFERENTIAL/PLATELET - Abnormal; Notable for the following components:      Result Value   WBC 15.0 (*)    Neutro Abs 13.6 (*)    Lymphs Abs 0.5 (*)    All other  components within normal limits  COMPREHENSIVE METABOLIC PANEL - Abnormal; Notable for the following components:   CO2 21 (*)    Glucose, Bld 136 (*)    BUN 32 (*)    Albumin 3.0 (*)    AST 61 (*)    Total Bilirubin 1.5 (*)    Anion gap 19 (*)    All other components within normal limits  CK - Abnormal;  Notable for the following components:   Total CK 642 (*)    All other components within normal limits  URINALYSIS, ROUTINE W REFLEX MICROSCOPIC - Abnormal; Notable for the following components:   APPearance HAZY (*)    Hgb urine dipstick MODERATE (*)    Ketones, ur 80 (*)    Protein, ur 100 (*)    Leukocytes,Ua SMALL (*)    Bacteria, UA MANY (*)    All other components within normal limits  PROCALCITONIN    EKG None  Radiology DG Foot Complete Left  Result Date: 05/28/2023 CLINICAL DATA:  Pain after a fall EXAM: LEFT FOOT - COMPLETE 3+ VIEW COMPARISON:  None Available. FINDINGS: Diffuse bone demineralization. Degenerative changes in the interphalangeal joints. Degenerative changes in the intertarsal joints. Prominent first toenail, possibly displaced. Suggestion of soft tissue injury to the plantar aspect of the first toe. No acute fracture or dislocation. Vascular calcifications. IMPRESSION: 1. No acute bony abnormalities. 2. Evidence of soft tissue injury to the left first toe with skin flap along the plantar surface and displaced nail. Electronically Signed   By: Burman Nieves M.D.   On: 05/28/2023 22:21   DG Foot Complete Right  Result Date: 05/28/2023 CLINICAL DATA:  Pain after a fall EXAM: RIGHT FOOT COMPLETE - 3+ VIEW COMPARISON:  None Available. FINDINGS: Diffuse bone demineralization. Oblique nondisplaced fracture at the plantar base of the proximal phalanx of the right first toe with intra-articular involvement. No other fractures are identified. No expansile or destructive bone lesions. Plantar calcaneal spur. Soft tissue swelling over the forefoot. IMPRESSION: Oblique  fracture at the base of the proximal phalanx of the first toe with articular involvement. Electronically Signed   By: Burman Nieves M.D.   On: 05/28/2023 22:19   DG Knee Complete 4 Views Right  Result Date: 05/28/2023 CLINICAL DATA:  Pain after a fall. EXAM: RIGHT KNEE - COMPLETE 4+ VIEW COMPARISON:  None Available. FINDINGS: Mild degenerative changes in the right knee with medial and lateral compartment narrowing and small osteophyte formation. Prominent osteophytes resulting in either bone on bone or coalition of the patellofemoral joint. No evidence of acute fracture or dislocation. No destructive bone lesions. No significant effusions. IMPRESSION: Mild degenerative changes with prominent osteophyte formation in the patellofemoral joint causing bone on bone or patellofemoral coalition. No acute bony abnormalities. No significant effusion. Electronically Signed   By: Burman Nieves M.D.   On: 05/28/2023 22:18   CT Maxillofacial Wo Contrast  Result Date: 05/28/2023 CLINICAL DATA:  Blunt facial trauma.  Fall.  Left eye swollen shut. EXAM: CT MAXILLOFACIAL WITHOUT CONTRAST TECHNIQUE: Multidetector CT imaging of the maxillofacial structures was performed. Multiplanar CT image reconstructions were also generated. RADIATION DOSE REDUCTION: This exam was performed according to the departmental dose-optimization program which includes automated exposure control, adjustment of the mA and/or kV according to patient size and/or use of iterative reconstruction technique. COMPARISON:  Tonight CT head 10/07/2007 FINDINGS: Osseous: No fracture or mandibular dislocation. No destructive process. Orbits: Globes and extraocular muscles appear intact and symmetrical. Sinuses: Paranasal sinuses and mastoid air cells are clear. Soft tissues: Left periorbital soft tissue swelling/hematoma. No retrobulbar involvement. Soft tissue infiltration along the subcutaneous fat anterior to the left maxilla and zygomatic arch likely  contusion. Limited intracranial: No significant or unexpected finding. IMPRESSION: 1. Left periorbital soft tissue hematoma with soft tissue infiltration/contusion extending over the left face. No loculated collections. No retrobulbar involvement. 2. No acute or displaced fractures are demonstrated. Electronically Signed  By: Burman Nieves M.D.   On: 05/28/2023 21:23   CT Cervical Spine Wo Contrast  Result Date: 05/28/2023 CLINICAL DATA:  Neck trauma.  Fall. EXAM: CT CERVICAL SPINE WITHOUT CONTRAST TECHNIQUE: Multidetector CT imaging of the cervical spine was performed without intravenous contrast. Multiplanar CT image reconstructions were also generated. RADIATION DOSE REDUCTION: This exam was performed according to the departmental dose-optimization program which includes automated exposure control, adjustment of the mA and/or kV according to patient size and/or use of iterative reconstruction technique. COMPARISON:  None Available. FINDINGS: Alignment: Normal alignment. Skull base and vertebrae: No acute fracture. No primary bone lesion or focal pathologic process. Soft tissues and spinal canal: No prevertebral soft tissue swelling. No abnormal paraspinal soft tissue mass or infiltration. There is infiltration in the subcutaneous fat over the left side of the neck. No loculated collections are identified. Changes are likely contusions. Disc levels: Degenerative changes with disc space narrowing and mild endplate osteophyte formation at C4-5 and C5-6 levels. Upper chest: Lung apices are clear. Calcification in the right thyroid gland, incompletely included and measuring 2 cm diameter. Other: None. IMPRESSION: 1. Normal alignment. Mild degenerative changes. No acute displaced fractures. 2. Soft tissue swelling along the left side of the neck in the subcutaneous fat possibly contusion. No loculated collections. 3. Incidental right thyroid nodule measuring 2 cm. Recommend non-emergent thyroid ultrasound.  Reference: J Am Coll Radiol. 2015 Feb;12(2): 143-50 Electronically Signed   By: Burman Nieves M.D.   On: 05/28/2023 21:19   CT Head Wo Contrast  Result Date: 05/28/2023 CLINICAL DATA:  Head trauma, minor.  Fall.  Left eye swollen shut. EXAM: CT HEAD WITHOUT CONTRAST TECHNIQUE: Contiguous axial images were obtained from the base of the skull through the vertex without intravenous contrast. RADIATION DOSE REDUCTION: This exam was performed according to the departmental dose-optimization program which includes automated exposure control, adjustment of the mA and/or kV according to patient size and/or use of iterative reconstruction technique. COMPARISON:  CT head 10/07/2007 FINDINGS: Brain: Diffuse cerebral atrophy. Ventricular dilatation consistent with central atrophy. Low-attenuation changes in the deep white matter consistent with small vessel ischemia. No abnormal extra-axial fluid collections. No mass effect or midline shift. Gray-white matter junctions are distinct. Basal cisterns are not effaced. No acute intracranial hemorrhage. Vascular: No hyperdense vessel or unexpected calcification. Skull: Calvarium appears intact. Sinuses/Orbits: Paranasal sinuses and mastoid air cells are clear. Other: None. IMPRESSION: No acute intracranial abnormalities. Chronic atrophy and small vessel ischemic changes. Electronically Signed   By: Burman Nieves M.D.   On: 05/28/2023 21:15    Procedures Procedures    Medications Ordered in ED Medications  cefTRIAXone (ROCEPHIN) 1 g in sodium chloride 0.9 % 100 mL IVPB (has no administration in time range)  0.9 %  sodium chloride infusion (has no administration in time range)  sodium chloride 0.9 % bolus 1,000 mL (1,000 mLs Intravenous New Bag/Given 05/29/23 0019)  acetaminophen (TYLENOL) tablet 650 mg (650 mg Oral Given 05/29/23 0023)    ED Course/ Medical Decision Making/ A&P                             Medical Decision Making Pt presenting with multiple  soft tissue injuries after fall and prolonged 3 day down time on floor in her home,  unable to get up on her own.    Amount and/or Complexity of Data Reviewed Labs: ordered.    Details: Labs significant for leukocytosis of 15.0,  she has a elevated BUN at 32 although her creatinine is normal at 0.95.  Her AST is slightly elevated at 61, her ALT is normal.  Her total CK is 642 and she has a UTI, 21-50 WBCs, many bacteria, 80 ketones. Radiology: ordered.    Details: CT maxillofacial, cervical and head negative for acute intracranial or facial fractures or injury.  She does have some soft tissue swelling only.  Right knee negative for fracture, right foot fracture at the base of her great toe phalanx, nondisplaced. Discussion of management or test interpretation with external provider(s): Patient would benefit from overnight observation, IV fluids and recheck of a CK tomorrow to ensure that this number does not continue to rise.  Call placed to hospitalist, Dr. Carren Rang agrees for obs admission.  Risk Decision regarding hospitalization.           Final Clinical Impression(s) / ED Diagnoses Final diagnoses:  Fall, initial encounter  Elevated CK  Acute cystitis with hematuria    Rx / DC Orders ED Discharge Orders     None         Victoriano Lain 05/29/23 0130    Vanetta Mulders, MD 06/02/23 1208

## 2023-05-28 NOTE — ED Notes (Addendum)
Pt covered in urine and feces, pt cleaned up, peri-care performed, Purewick placed, new gown, and linens placed on pt, pt incontinent of urine and hospice pt

## 2023-05-28 NOTE — ED Triage Notes (Signed)
[facility] reports pt coming from home. Neighbors heard pt yelling for help and called 911. Upon EMS arrival pt on the floor face down conscious and alert. Pt with blisters on her left arm. Left eye swollen shut.

## 2023-05-29 ENCOUNTER — Ambulatory Visit: Payer: Medicare Other

## 2023-05-29 ENCOUNTER — Other Ambulatory Visit: Payer: Medicare Other

## 2023-05-29 DIAGNOSIS — G9341 Metabolic encephalopathy: Secondary | ICD-10-CM | POA: Insufficient documentation

## 2023-05-29 DIAGNOSIS — E860 Dehydration: Secondary | ICD-10-CM | POA: Diagnosis present

## 2023-05-29 DIAGNOSIS — M25561 Pain in right knee: Secondary | ICD-10-CM | POA: Diagnosis present

## 2023-05-29 DIAGNOSIS — Z79890 Hormone replacement therapy (postmenopausal): Secondary | ICD-10-CM | POA: Diagnosis not present

## 2023-05-29 DIAGNOSIS — E785 Hyperlipidemia, unspecified: Secondary | ICD-10-CM | POA: Diagnosis present

## 2023-05-29 DIAGNOSIS — E039 Hypothyroidism, unspecified: Secondary | ICD-10-CM

## 2023-05-29 DIAGNOSIS — W19XXXA Unspecified fall, initial encounter: Secondary | ICD-10-CM

## 2023-05-29 DIAGNOSIS — E876 Hypokalemia: Secondary | ICD-10-CM | POA: Diagnosis present

## 2023-05-29 DIAGNOSIS — E782 Mixed hyperlipidemia: Secondary | ICD-10-CM

## 2023-05-29 DIAGNOSIS — Z9012 Acquired absence of left breast and nipple: Secondary | ICD-10-CM | POA: Diagnosis not present

## 2023-05-29 DIAGNOSIS — B9620 Unspecified Escherichia coli [E. coli] as the cause of diseases classified elsewhere: Secondary | ICD-10-CM | POA: Diagnosis present

## 2023-05-29 DIAGNOSIS — Z885 Allergy status to narcotic agent status: Secondary | ICD-10-CM | POA: Diagnosis not present

## 2023-05-29 DIAGNOSIS — M8580 Other specified disorders of bone density and structure, unspecified site: Secondary | ICD-10-CM | POA: Diagnosis present

## 2023-05-29 DIAGNOSIS — Z8249 Family history of ischemic heart disease and other diseases of the circulatory system: Secondary | ICD-10-CM | POA: Diagnosis not present

## 2023-05-29 DIAGNOSIS — N390 Urinary tract infection, site not specified: Secondary | ICD-10-CM | POA: Diagnosis present

## 2023-05-29 DIAGNOSIS — R531 Weakness: Secondary | ICD-10-CM

## 2023-05-29 DIAGNOSIS — W010XXA Fall on same level from slipping, tripping and stumbling without subsequent striking against object, initial encounter: Secondary | ICD-10-CM | POA: Diagnosis present

## 2023-05-29 DIAGNOSIS — N3000 Acute cystitis without hematuria: Secondary | ICD-10-CM

## 2023-05-29 DIAGNOSIS — R748 Abnormal levels of other serum enzymes: Secondary | ICD-10-CM | POA: Diagnosis present

## 2023-05-29 DIAGNOSIS — M170 Bilateral primary osteoarthritis of knee: Secondary | ICD-10-CM | POA: Diagnosis present

## 2023-05-29 DIAGNOSIS — S0083XA Contusion of other part of head, initial encounter: Secondary | ICD-10-CM | POA: Diagnosis present

## 2023-05-29 DIAGNOSIS — Z853 Personal history of malignant neoplasm of breast: Secondary | ICD-10-CM | POA: Diagnosis not present

## 2023-05-29 DIAGNOSIS — F0390 Unspecified dementia without behavioral disturbance: Secondary | ICD-10-CM

## 2023-05-29 DIAGNOSIS — Z79899 Other long term (current) drug therapy: Secondary | ICD-10-CM | POA: Diagnosis not present

## 2023-05-29 LAB — CBC WITH DIFFERENTIAL/PLATELET
Abs Immature Granulocytes: 0.07 10*3/uL (ref 0.00–0.07)
Basophils Absolute: 0 10*3/uL (ref 0.0–0.1)
Basophils Relative: 0 %
Eosinophils Absolute: 0 10*3/uL (ref 0.0–0.5)
Eosinophils Relative: 0 %
HCT: 36.5 % (ref 36.0–46.0)
Hemoglobin: 12.4 g/dL (ref 12.0–15.0)
Immature Granulocytes: 1 %
Lymphocytes Relative: 5 %
Lymphs Abs: 0.6 10*3/uL — ABNORMAL LOW (ref 0.7–4.0)
MCH: 32.5 pg (ref 26.0–34.0)
MCHC: 34 g/dL (ref 30.0–36.0)
MCV: 95.5 fL (ref 80.0–100.0)
Monocytes Absolute: 0.8 10*3/uL (ref 0.1–1.0)
Monocytes Relative: 6 %
Neutro Abs: 11.4 10*3/uL — ABNORMAL HIGH (ref 1.7–7.7)
Neutrophils Relative %: 88 %
Platelets: 308 10*3/uL (ref 150–400)
RBC: 3.82 MIL/uL — ABNORMAL LOW (ref 3.87–5.11)
RDW: 13.9 % (ref 11.5–15.5)
WBC: 13 10*3/uL — ABNORMAL HIGH (ref 4.0–10.5)
nRBC: 0 % (ref 0.0–0.2)

## 2023-05-29 LAB — MAGNESIUM: Magnesium: 1.9 mg/dL (ref 1.7–2.4)

## 2023-05-29 LAB — TSH: TSH: 0.563 u[IU]/mL (ref 0.350–4.500)

## 2023-05-29 LAB — URINALYSIS, ROUTINE W REFLEX MICROSCOPIC
Bilirubin Urine: NEGATIVE
Glucose, UA: NEGATIVE mg/dL
Ketones, ur: 80 mg/dL — AB
Nitrite: NEGATIVE
Protein, ur: 100 mg/dL — AB
Specific Gravity, Urine: 1.024 (ref 1.005–1.030)
pH: 6 (ref 5.0–8.0)

## 2023-05-29 LAB — COMPREHENSIVE METABOLIC PANEL
ALT: 27 U/L (ref 0–44)
AST: 49 U/L — ABNORMAL HIGH (ref 15–41)
Albumin: 2.6 g/dL — ABNORMAL LOW (ref 3.5–5.0)
Alkaline Phosphatase: 43 U/L (ref 38–126)
Anion gap: 12 (ref 5–15)
BUN: 35 mg/dL — ABNORMAL HIGH (ref 8–23)
CO2: 23 mmol/L (ref 22–32)
Calcium: 8.8 mg/dL — ABNORMAL LOW (ref 8.9–10.3)
Chloride: 105 mmol/L (ref 98–111)
Creatinine, Ser: 0.81 mg/dL (ref 0.44–1.00)
GFR, Estimated: >60 mL/min (ref >60)
Glucose, Bld: 168 mg/dL — ABNORMAL HIGH (ref 70–99)
Potassium: 3 mmol/L — ABNORMAL LOW (ref 3.5–5.1)
Sodium: 140 mmol/L (ref 135–145)
Total Bilirubin: 0.9 mg/dL (ref 0.3–1.2)
Total Protein: 5.9 g/dL — ABNORMAL LOW (ref 6.5–8.1)

## 2023-05-29 LAB — PROCALCITONIN: Procalcitonin: 0.15 ng/mL

## 2023-05-29 MED ORDER — ONDANSETRON HCL 4 MG PO TABS: 4.0000 mg | ORAL_TABLET | Freq: Four times a day (QID) | ORAL | Status: DC | PRN

## 2023-05-29 MED ORDER — SODIUM CHLORIDE 0.9 % IV SOLN
1.0000 g | INTRAVENOUS | Status: DC
  Administered 2023-05-29 – 2023-06-01 (×4): 1 g via INTRAVENOUS
  Filled 2023-05-29 (×4): qty 10

## 2023-05-29 MED ORDER — ONDANSETRON HCL 4 MG/2ML IJ SOLN: 4.0000 mg | Freq: Four times a day (QID) | INTRAMUSCULAR | Status: DC | PRN

## 2023-05-29 MED ORDER — LEVOTHYROXINE SODIUM 50 MCG PO TABS: 75.0000 ug | ORAL_TABLET | Freq: Every day | ORAL | Status: DC

## 2023-05-29 MED ORDER — ACETAMINOPHEN 325 MG PO TABS
650.0000 mg | ORAL_TABLET | Freq: Four times a day (QID) | ORAL | Status: DC | PRN
  Administered 2023-05-29 – 2023-06-01 (×3): 650 mg via ORAL
  Filled 2023-05-29 (×3): qty 2

## 2023-05-29 MED ORDER — POTASSIUM CHLORIDE 20 MEQ PO PACK: 40.0000 meq | PACK | Freq: Once | ORAL | Status: DC

## 2023-05-29 MED ORDER — LEVOTHYROXINE SODIUM 75 MCG PO TABS
75.0000 ug | ORAL_TABLET | Freq: Every day | ORAL | Status: DC
  Administered 2023-05-29 – 2023-06-02 (×5): 75 ug via ORAL
  Filled 2023-05-29 (×4): qty 1
  Filled 2023-05-29: qty 2

## 2023-05-29 MED ORDER — SODIUM CHLORIDE 0.9 % IV SOLN: INTRAVENOUS | Status: AC

## 2023-05-29 MED ORDER — ACETAMINOPHEN 325 MG PO TABS
650.0000 mg | ORAL_TABLET | Freq: Once | ORAL | Status: AC
  Administered 2023-05-29: 650 mg via ORAL
  Filled 2023-05-29: qty 2

## 2023-05-29 MED ORDER — SODIUM CHLORIDE 0.9 % IV BOLUS
1000.0000 mL | Freq: Once | INTRAVENOUS | Status: AC
  Administered 2023-05-29: 1000 mL via INTRAVENOUS

## 2023-05-29 MED ORDER — ACETAMINOPHEN 650 MG RE SUPP: 650.0000 mg | Freq: Four times a day (QID) | RECTAL | Status: DC | PRN

## 2023-05-29 MED ORDER — HEPARIN SODIUM (PORCINE) 5000 UNIT/ML IJ SOLN
5000.0000 [IU] | Freq: Three times a day (TID) | INTRAMUSCULAR | Status: DC
  Administered 2023-05-29 – 2023-06-02 (×12): 5000 [IU] via SUBCUTANEOUS
  Filled 2023-05-29 (×12): qty 1

## 2023-05-29 MED ORDER — POTASSIUM CHLORIDE CRYS ER 20 MEQ PO TBCR
40.0000 meq | EXTENDED_RELEASE_TABLET | Freq: Two times a day (BID) | ORAL | Status: AC
  Administered 2023-05-29 (×2): 40 meq via ORAL
  Filled 2023-05-29 (×2): qty 2

## 2023-05-29 MED ORDER — DONEPEZIL HCL 5 MG PO TABS
10.0000 mg | ORAL_TABLET | Freq: Every day | ORAL | Status: DC
  Administered 2023-05-29 – 2023-06-01 (×4): 10 mg via ORAL
  Filled 2023-05-29 (×4): qty 2

## 2023-05-29 MED ORDER — OXYCODONE HCL 5 MG PO TABS: 5.0000 mg | ORAL_TABLET | ORAL | Status: DC | PRN

## 2023-05-29 MED ORDER — SIMVASTATIN 10 MG PO TABS: 40.0000 mg | ORAL_TABLET | Freq: Every day | ORAL | Status: DC

## 2023-05-29 NOTE — [place] FL2 (Signed)
[place] MEDICAID FL2 LEVEL OF CARE FORM     IDENTIFICATION  Patient Name: Deanna Henderson Birthdate: 01-05-1951 Sex: female Admission Date (Current Location): 05/28/2023  [place] and [place] Number:  Aaron Edelman [id number] T Facility and Address:  [facility],  [address] [place] [address]      Provider Number: [id number]  Attending Physician Name and Address:  Erick Blinks, DO  Relative Name and Phone Number:  Reiter,Jennifer  Other  [phone]  EXT. [phone]    Current Level of Care: Hospital (observation) Recommended Level of Care:   Prior Approval Number:    Date Approved/Denied:   PASRR Number: [id number]  Discharge Plan: SNF    Current Diagnoses: Patient Active Problem List   Diagnosis Date Noted   Fall 05/29/2023   HLD (hyperlipidemia) 05/29/2023   Hypothyroidism 05/29/2023   UTI (urinary tract infection) 05/29/2023   Generalized weakness 05/29/2023   Dehydration 05/29/2023   Dementia (HCC) 05/29/2023   Acute metabolic encephalopathy 05/29/2023   Osteoporosis 11/29/2021   Post-menopausal 09/20/2015   Axillary mass 01/19/2015   Obesity 01/22/2012   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    Orientation RESPIRATION BLADDER Height & Weight     Self, Situation, Place, Time  Normal Incontinent Weight:   Height:     BEHAVIORAL SYMPTOMS/MOOD NEUROLOGICAL BOWEL NUTRITION STATUS      Incontinent Diet (Heart healthy)  AMBULATORY STATUS COMMUNICATION OF NEEDS Skin   Limited Assist Verbally Other (Comment) (Bruising on left face with left eye swollen shut, missing a toenail on the left and has a broken toe on the right)                       Personal Care Assistance Level of Assistance  Bathing, Feeding, Dressing Bathing Assistance: Limited assistance Feeding assistance: Independent Dressing Assistance: Limited assistance     Functional Limitations Info  Sight,  Hearing, Speech Sight Info: Adequate Hearing Info: Adequate Speech Info: Adequate    SPECIAL CARE FACTORS FREQUENCY  PT (By licensed PT)     PT Frequency: 5x/week              Contractures Contractures Info: Not present    Additional Factors Info  Code Status, Allergies Code Status Info: Full Code Allergies Info: Codeine           Current Medications (05/29/2023):  This is the current hospital active medication list Current Facility-Administered Medications  Medication Dose Route Frequency Provider Last Rate Last Admin   acetaminophen (TYLENOL) tablet 650 mg  650 mg Oral Q6H PRN Zierle-Ghosh, Asia B, DO   650 mg at 05/29/23 1053   Or   acetaminophen (TYLENOL) suppository 650 mg  650 mg Rectal Q6H PRN Zierle-Ghosh, Asia B, DO       cefTRIAXone (ROCEPHIN) 1 g in sodium chloride 0.9 % 100 mL IVPB  1 g Intravenous Q24H Zierle-Ghosh, Asia B, DO   Stopped at 05/29/23 0216   donepezil (ARICEPT) tablet 10 mg  10 mg Oral Daily Zierle-Ghosh, Asia B, DO   10 mg at 05/29/23 1053   heparin injection 5,000 Units  5,000 Units Subcutaneous Q8H Zierle-Ghosh, Asia B, DO   5,000 Units at 05/29/23 0624   levothyroxine (SYNTHROID) tablet 75 mcg  75 mcg Oral Q0600 Zierle-Ghosh, Asia B, DO   75 mcg at 05/29/23 0622   ondansetron (ZOFRAN) tablet 4 mg  4 mg Oral Q6H PRN Zierle-Ghosh, [place]  B, DO       Or   ondansetron (ZOFRAN) injection 4 mg  4 mg Intravenous Q6H PRN Zierle-Ghosh, Asia B, DO       oxyCODONE (Oxy IR/ROXICODONE) immediate release tablet 5 mg  5 mg Oral Q4H PRN Zierle-Ghosh, Asia B, DO       potassium chloride SA (KLOR-CON M) CR tablet 40 mEq  40 mEq Oral BID Sherryll Burger, Pratik D, DO   40 mEq at 05/29/23 1053     Discharge Medications: Please see discharge summary for a list of discharge medications.  Relevant Imaging Results:  Relevant Lab Results:   Additional Information SSN [id number]. Patient is currently in obs. She will be placed under her Medicaid.  Shatona Andujar, Juleen China,  LCSW

## 2023-05-29 NOTE — Evaluation (Signed)
Physical Therapy Evaluation Patient Details Name: Deanna Henderson MRN: [id number] DOB: 06-Jul-1951 Today's Date: 05/29/2023  History of Present Illness  Deanna Henderson is a 72 y.o. female with medical history significant of difficulty of ambulation due to balance, hypothyroidism, hyperlipidemia, dementia, and more presents the ED with a chief complaint of fall.  Patient reports that she fell on Sunday and laid on the floor until today when she was found.  However, she does not know what day today is, so the history that she fell on Sunday may not be reliable.  Also her CPK is only very mildly elevated which is not consistent with laying on the floor for 3 days.  Her CPK today was 642.  Patient is dehydrated but without an AKI.  Her clinical presentation is not consistent with laying on the floor for 3 days.  Patient has no recollection of the fall.  She reports she is woke up on the floor.  She thinks she could have tripped over something since she is missing a toenail on the left and has a broken toe on the right, but does not remember doing so.  She is not on any blood thinners.  She is not sure if she lost consciousness.  Her pain is well-controlled at this time.  Ended time leading up to her fall she reports it was a pretty normal day.  She was taking her Tylenol and her Lasix when last she can recall.  She does report that she has had some allergies with some rhinorrhea.  She has had a decreased appetite because of the allergies.  So she has not been eating or drinking very much but she has been taking her Lasix anyways.  Patient denies any dysuria or hematuria.  She has no other complaints at this time.   Clinical Impression  Patient demonstrates slow labored movement for sitting up at bedside with [facility] raised, unable to move legs due to weakness requiring Mod/max assist to move legs, limited to a few steps at bedside due to weakness and poor standing balance.  Patient tolerated sitting up in chair  after therapy with visitor in room - RN aware. Patient will benefit from continued skilled physical therapy in hospital and recommended venue below to increase strength, balance, endurance for safe ADLs and gait.          Recommendations for follow up therapy are one component of a multi-disciplinary discharge planning process, led by the attending physician.  Recommendations may be updated based on patient status, additional functional criteria and insurance authorization.  Follow Up Recommendations Can patient physically be transported by private vehicle: Yes     Assistance Recommended at Discharge Intermittent Supervision/Assistance  Patient can return home with the following  A lot of help with bathing/dressing/bathroom;A lot of help with walking and/or transfers;Help with stairs or ramp for entrance;Assistance with cooking/housework    Equipment Recommendations None recommended by PT  Recommendations for Other Services       Functional Status Assessment Patient has had a recent decline in their functional status and demonstrates the ability to make significant improvements in function in a reasonable and predictable amount of time.     Precautions / Restrictions Precautions Precautions: Fall Restrictions Weight Bearing Restrictions: No      Mobility  Bed Mobility Overal bed mobility: Needs Assistance Bed Mobility: Supine to Sit     Supine to sit: HOB elevated, Mod assist     General bed mobility comments: increased time labored movement  requiring HOB raised    Transfers Overall transfer level: Needs assistance Equipment used: Rolling walker (2 wheels) Transfers: Sit to/from Stand, Bed to chair/wheelchair/BSC Sit to Stand: Mod assist   Step pivot transfers: Mod assist       General transfer comment: unsteady labored movement    Ambulation/Gait Ambulation/Gait assistance: Mod assist Gait Distance (Feet): 6 Feet Assistive device: Rolling walker (2  wheels) Gait Pattern/deviations: Decreased step length - right, Decreased step length - left, Decreased stride length, Trunk flexed, Antalgic Gait velocity: slow     General Gait Details: limited to a few slow labored side steps, steps forward/backward before having to sit due to fatiuge/fall risk  Stairs            Wheelchair Mobility    Modified Rankin (Stroke Patients Only)       Balance Overall balance assessment: Needs assistance Sitting-balance support: Feet supported, No upper extremity supported Sitting balance-Leahy Scale: Fair Sitting balance - Comments: seated at EOB   Standing balance support: Reliant on assistive device for balance, During functional activity, Bilateral upper extremity supported Standing balance-Leahy Scale: Poor Standing balance comment: fair/poor using RW                             Pertinent Vitals/Pain Pain Assessment Pain Assessment: Faces Faces Pain Scale: Hurts little more Pain Location: right foot, big toe due to fracture, left foot big toe due to toe nail torn off Pain Descriptors / Indicators: Sore, Discomfort Pain Intervention(s): Limited activity within patient's tolerance, Monitored during session, Repositioned    Home Living Family/patient expects to be discharged to:: Private residence Living Arrangements: Alone Available Help at Discharge: Personal care attendant;Friend(s);Available PRN/intermittently Type of Home: Apartment Home Access: Level entry       Home Layout: One level Home Equipment: Agricultural consultant (2 wheels);Cane - quad;Tub bench;Grab bars - tub/shower      Prior Function Prior Level of Function : Needs assist       Physical Assist : Mobility (physical);ADLs (physical) Mobility (physical): Bed mobility;Transfers;Gait;Stairs   Mobility Comments: household distances using quad-cane, recently having to use RW ADLs Comments: Home aides 2 hours/day x 5 days/week, friend helps     Hand  Dominance        Extremity/Trunk Assessment   Upper Extremity Assessment Upper Extremity Assessment: Generalized weakness    Lower Extremity Assessment Lower Extremity Assessment: Generalized weakness    Cervical / Trunk Assessment Cervical / Trunk Assessment: Kyphotic  Communication   Communication: No difficulties  Cognition Arousal/Alertness: Awake/alert Behavior During Therapy: WFL for tasks assessed/performed Overall Cognitive Status: Within Functional Limits for tasks assessed                                          General Comments      Exercises     Assessment/Plan    PT Assessment Patient needs continued PT services  PT Problem List Decreased strength;Decreased activity tolerance;Decreased balance;Decreased mobility       PT Treatment Interventions DME instruction;Gait training;Stair training;Functional mobility training;Therapeutic activities;Therapeutic exercise;Patient/family education;Balance training    PT Goals (Current goals can be found in the Care Plan section)  Acute Rehab PT Goals Patient Stated Goal: return home after rehab PT Goal Formulation: With patient Time For Goal Achievement: 06/12/23 Potential to Achieve Goals: Good    Frequency Min 3X/week  Co-evaluation               AM-PAC PT "6 Clicks" Mobility  Outcome Measure Help needed turning from your back to your side while in a flat bed without using bedrails?: A Lot Help needed moving from lying on your back to sitting on the side of a flat bed without using bedrails?: A Lot Help needed moving to and from a bed to a chair (including a wheelchair)?: A Lot Help needed standing up from a chair using your arms (e.g., wheelchair or bedside chair)?: A Lot Help needed to walk in hospital room?: A Lot Help needed climbing 3-5 steps with a railing? : Total 6 Click Score: 11    End of Session   Activity Tolerance: Patient tolerated treatment well;Patient limited  by fatigue Patient left: in chair;with call bell/phone within reach;with family/visitor present;with nursing/sitter in room Nurse Communication: Mobility status PT Visit Diagnosis: Unsteadiness on feet (R26.81);Other abnormalities of gait and mobility (R26.89)    Time: 1610-9604 PT Time Calculation (min) (ACUTE ONLY): 30 min   Charges:   PT Evaluation $PT Eval Moderate Complexity: 1 Mod PT Treatments $Therapeutic Activity: 23-37 mins        2:37 PM, 05/29/23 Ocie Bob, MPT Physical Therapist with [facility] [phone] office [phone] mobile phone

## 2023-05-29 NOTE — TOC Initial Note (Signed)
Transition of Care [facility]) - Initial/Assessment Note    Patient Details  Name: Deanna Henderson MRN: [id number] Date of Birth: 1951/12/10  Transition of Care [facility]) CM/SW Contact:    Annice Needy, LCSW Phone Number: 05/29/2023, 11:16 AM  Clinical Narrative:                 Patient from home alone. Independent at baseline. Admitted after fall. Active with [facility] At risk case manager, Juliette Alcide is the case manger. She pays patient's bills and provides case management services. Patient has four prong cane and a front wheeled rolling walker. Patient previously had an aide but aide stopped due to bed bugs. Bed bugs were treated the week before Memorial day. PT recommends SNF. Patient is agreeable. Patient advised that since she does not have a three night inpatient hospital stay she will have to be placed under her Medicaid and would have to stay 30 days. Patient and casemanger are agreeable to staying 30 days. Casemanger advises that patient has additional funds to keep her bill pays while she is in rehab. Referral sent to facilities of choice.   Expected Discharge Plan: Skilled Nursing Facility Barriers to Discharge: Continued Medical Work up   Patient Goals and CMS Choice Patient states their goals for this hospitalization and ongoing recovery are:: short term rehab   Choice offered to / list presented to : Patient (at risk case manger, Juliette Alcide) [place] ownership interest in [facility].provided to:: Patient    Expected Discharge Plan and Services     Post Acute Care Choice: Skilled Nursing Facility Living arrangements for the past 2 months: Apartment                                      Prior Living Arrangements/Services Living arrangements for the past 2 months: Apartment Lives with:: Self Patient language and need for interpreter reviewed:: Yes        Need for Family Participation in Patient Care: Yes (Comment) Care giver support system in  place?: Yes (comment) Current home services: DME (four quad cane, fww) Criminal Activity/Legal Involvement Pertinent to Current Situation/Hospitalization: No - Comment as needed  Activities of Daily Living      Permission Sought/Granted Permission sought to share information with : Case Manager    Share Information with NAME: [facility] DSS At risk case manger, Juliette Alcide, who was at bedside and listed on patient's chart           Emotional Assessment     Affect (typically observed): Appropriate Orientation: : Oriented to Self, Oriented to Place, Oriented to  Time, Oriented to Situation Alcohol / Substance Use: Not Applicable    Admission diagnosis:  Fall [W19.XXXA] Elevated CK [R74.8] Acute cystitis with hematuria [N30.01] Fall, initial encounter [W19.XXXA] Patient Active Problem List   Diagnosis Date Noted   Fall 05/29/2023   HLD (hyperlipidemia) 05/29/2023   Hypothyroidism 05/29/2023   UTI (urinary tract infection) 05/29/2023   Generalized weakness 05/29/2023   Dehydration 05/29/2023   Dementia (HCC) 05/29/2023   Acute metabolic encephalopathy 05/29/2023   Osteoporosis 11/29/2021   Post-menopausal 09/20/2015   Axillary mass 01/19/2015   Obesity 01/22/2012   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 [address] [place] [address] Phone: [phone] Fax: [phone]     Social Determinants of Health (SDOH) Social History: SDOH Screenings   Food Insecurity: No Food Insecurity (11/16/2020)  Housing: Low Risk  (11/16/2020)  Transportation Needs: No Transportation Needs (11/16/2020)  Alcohol Screen: Low Risk  (11/16/2020)  Depression (PHQ2-9): Low Risk   (11/16/2020)  Financial Resource Strain: Low Risk  (11/16/2020)  Physical Activity: Insufficiently Active (11/16/2020)  Social Connections: Moderately Integrated (11/16/2020)  Stress: No Stress Concern Present (11/16/2020)  Tobacco Use: Low Risk  (05/28/2023)   SDOH Interventions:     Readmission Risk Interventions     No data to display

## 2023-05-29 NOTE — Assessment & Plan Note (Addendum)
-   UA indicative of UTI - Urine culture pending - Start Rocephin - Associated leukocytosis of 15.0, trend in the a.m. - Continue to monitor

## 2023-05-29 NOTE — Plan of Care (Signed)
  Problem: Acute Rehab PT Goals(only PT should resolve) Goal: Pt Will Go Supine/Side To Sit Outcome: Progressing Flowsheets (Taken 05/29/2023 1439) Pt will go Supine/Side to Sit:  with minimal assist  with moderate assist Goal: Patient Will Transfer Sit To/From Stand Outcome: Progressing Flowsheets (Taken 05/29/2023 1439) Patient will transfer sit to/from stand:  with minimal assist  with moderate assist Goal: Pt Will Transfer Bed To Chair/Chair To Bed Outcome: Progressing Flowsheets (Taken 05/29/2023 1439) Pt will Transfer Bed to Chair/Chair to Bed:  with mod assist  with min assist Goal: Pt Will Ambulate Outcome: Progressing Flowsheets (Taken 05/29/2023 1439) Pt will Ambulate:  25 feet  with minimal assist  with moderate assist  with rolling walker   2:39 PM, 05/29/23 Ocie Bob, MPT Physical Therapist with [facility] [phone] office [phone] mobile phone

## 2023-05-29 NOTE — Assessment & Plan Note (Signed)
-   No recollection of how she fell - Bruising on left face with left eye swollen shut - CT C-spine shows soft tissue swelling along the left side of the neck in the subcu fat possibly a contusion - CT head shows no acute intracranial abnormality - CT maxillofacial shows left periorbital soft tissue hematoma with no loculated collections - Patient does have a broken toe - She was given Tylenol and 1 L normal saline in the ED - Continue pain control with Tylenol and oxycodone - Continue to monitor - PT eval and treat

## 2023-05-29 NOTE — ED Notes (Signed)
Lab at bedside

## 2023-05-29 NOTE — Assessment & Plan Note (Signed)
-   Likely related to dehydration, UTI - Possibly also related to hypothyroidism - Continue to hydrate, treat infection, check TSH - PT eval and treat - Continue to monitor

## 2023-05-29 NOTE — Assessment & Plan Note (Signed)
Continue statin. 

## 2023-05-29 NOTE — Assessment & Plan Note (Signed)
Continue Aricept 

## 2023-05-29 NOTE — Assessment & Plan Note (Signed)
-   Patient appeared clinically dry at presentation - She has a BUN to creatinine ratio of 32 : 0.95 - 1 L normal saline given in the ED - Continue IV hydration - Trend labs in a.m.

## 2023-05-29 NOTE — Assessment & Plan Note (Signed)
-   Likely related to infection/dehydration - CT head shows no acute intracranial abnormality - Treat UTI, continue IV hydration - Anticipate improvement with this treatment

## 2023-05-29 NOTE — H&P (Signed)
History and Physical    Patient: Deanna Henderson JWJ:[id number] DOB: 1950/12/23 DOA: 05/28/2023 DOS: the patient was seen and examined on 05/29/2023 PCP: Pearson Grippe, MD  Patient coming from: Home  Chief Complaint:  Chief Complaint  Patient presents with   Fall   HPI: Deanna Henderson is a 72 y.o. female with medical history significant of difficulty of ambulation due to balance, hypothyroidism, hyperlipidemia, dementia, and more presents the ED with a chief complaint of fall.  Patient reports that she fell on Sunday and laid on the floor until today when she was found.  However, she does not know what day today is, so the history that she fell on Sunday may not be reliable.  Also her CPK is only very mildly elevated which is not consistent with laying on the floor for 3 days.  Her CPK today was 642.  Patient is dehydrated but without an AKI.  Her clinical presentation is not consistent with laying on the floor for 3 days.  Patient has no recollection of the fall.  She reports she is woke up on the floor.  She thinks she could have tripped over something since she is missing a toenail on the left and has a broken toe on the right, but does not remember doing so.  She is not on any blood thinners.  She is not sure if she lost consciousness.  Her pain is well-controlled at this time.  Ended time leading up to her fall she reports it was a pretty normal day.  She was taking her Tylenol and her Lasix when last she can recall.  She does report that she has had some allergies with some rhinorrhea.  She has had a decreased appetite because of the allergies.  So she has not been eating or drinking very much but she has been taking her Lasix anyways.  Patient denies any dysuria or hematuria.  She has no other complaints at this time.  Patient does not smoke, does not drink.  She is vaccinated for COVID, flu, RSV.  Patient reports that she is full code. Review of Systems: As mentioned in the history of present  illness. All other systems reviewed and are negative. 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 She has never used smokeless tobacco. She reports that she does not drink alcohol and does not use drugs.  Allergies  Allergen Reactions   Codeine Nausea Only    Family History  Problem Relation Age of Onset   Cancer Mother    Cancer Father    Hypertension Sister    Diabetes Sister    Cancer Sister     Prior to Admission medications   Medication Sig Start Date End Date Taking? Authorizing Provider  acetaminophen (TYLENOL) 500 MG  tablet Take 500 mg by mouth every 6 (six) hours as needed.    [provider]  albuterol (VENTOLIN HFA) 108 (90 Base) MCG/ACT inhaler PLEASE SEE ATTACHED FOR DETAILED DIRECTIONS 10/28/21   [provider]  Calcium Carbonate (CALCIUM 600 HIGH POTENCY PO) Take 600 mg by mouth 2 (two) times daily.     [provider]  calcium carbonate (TUMS EX) 750 MG chewable tablet Chew 1 tablet by mouth as needed.    [provider]  Cholecalciferol (VITAMIN D3) 1000 UNITS CAPS Take by mouth daily.      [provider]  Corn Starch POWD Apply topically.    [provider]  donepezil (ARICEPT) 10 MG tablet Take 10 mg by mouth daily.      [provider]   fluticasone (FLONASE) 50 MCG/ACT nasal spray Place 2 sprays into both nostrils daily.    [provider]  hydrochlorothiazide (HYDRODIURIL) 25 MG tablet 25 mg daily.  12/17/14   [provider]  levothyroxine (SYNTHROID) 75 MCG tablet Take 75 mcg by mouth daily before breakfast.    [provider]  loratadine (CLARITIN) 10 MG tablet TAKE 1 TABLET BY MOUTH DAILY FOR ALLERGIES 10/22/18   [provider]  Multiple Vitamins-Minerals ([employer] SPECTRAVITE SENIOR PO) Take by mouth daily.      [provider]  Omega-3 Fatty Acids (FISH OIL) 1200 MG CAPS Take by mouth 3 (three) times daily.      [provider]  simvastatin (ZOCOR) 40 MG tablet Take 40 mg by mouth at bedtime.      [provider]    Physical Exam: Vitals:   05/29/23 0100 05/29/23 0115 05/29/23 0130 05/29/23 0215  BP: (!) 118/53 (!) 121/55 (!) 120/55 110/62  Pulse: 82 84 69 93  Resp: 19 17 20 20   Temp:      TempSrc:      SpO2: 99% 98% 99% 98%   1.  General: Patient lying supine in bed, appears to beat up   2. Psychiatric: Alert and oriented x person and place, mood and behavior normal for situation, pleasant and cooperative with exam   3. Neurologic: Speech and language are normal, face is symmetric, moves all 4 extremities voluntarily, the only thing different from baseline as she does not remember her fall   4. HEENMT:  Bruising over left face, left eye swollen shut normocephalic, pupils reactive to light, neck is supple, trachea is midline, mucous membranes are moist   5. Respiratory : Lungs are clear to auscultation bilaterally without wheezing, rhonchi, rales, no cyanosis, no increase in work of breathing or accessory muscle use   6. Cardiovascular : Heart rate normal, rhythm is regular, no rubs or gallops, no peripheral edema, peripheral pulses palpated   7. Gastrointestinal:  Abdomen is soft, nondistended, nontender to palpation bowel sounds active, no  masses or organomegaly palpated   8. Skin:  Fluid-filled vesicles on left arm, no excoriations, no erythema   9.Musculoskeletal:  Significant bruising right foot great toe, left foot great toe without toenail, bruising on left leg  Data Reviewed: In the ED Temp 97.4-97.6, heart rate 76-87, respiratory rate 16-21, blood pressure 121/50-137/95, satting at 97-100% on room air Leukocytosis with 15.0, hemoglobin 14.2 likely hemoconcentrated Chemistry is mostly unremarkable except for anion gap of 19 which is likely related to dehydration CPK 642 UA indicative of UTI CT C-spine shows soft tissue swelling along the left side of the neck in the subcu fat possibly with  a contusion, there is also an incidental right thyroid nodule CT head shows no acute intracranial abnormality CT maxillofacial shows left periorbital soft tissue hematoma and no loculated collections X-ray right foot shows an oblique fracture at the base of the proximal phalanx of the first toe with articular involvement X-ray of knee shows mild degenerative changes with prominent osteophyte formation and no effusion Patient was given Tylenol and 1 L normal saline in the ED Admission was requested due to dehydration and patient confusion about her fall as well as generalized weakness No leukocytosis with a white blood cell countAssessment and Plan: * Fall - No recollection of how she fell - Bruising on left face with left eye swollen shut - CT C-spine shows soft tissue swelling along the left side of the neck in the subcu fat possibly a contusion - CT head shows no acute intracranial abnormality - CT maxillofacial shows left periorbital soft tissue hematoma with no loculated collections - Patient does have a broken toe - She was given Tylenol and 1 L normal saline in the ED - Continue pain control with Tylenol and oxycodone - Continue to monitor - PT eval and treat  Acute metabolic encephalopathy - Likely related to  infection/dehydration - CT head shows no acute intracranial abnormality - Treat UTI, continue IV hydration - Anticipate improvement with this treatment  Dementia (HCC) - Continue Aricept  Dehydration - Patient appeared clinically dry at presentation - She has a BUN to creatinine ratio of 32 : 0.95 - 1 L normal saline given in the ED - Continue IV hydration - Trend labs in a.m.  Generalized weakness - Likely related to dehydration, UTI - Possibly also related to hypothyroidism - Continue to hydrate, treat infection, check TSH - PT eval and treat - Continue to monitor  UTI (urinary tract infection) - UA indicative of UTI - Urine culture pending - Start Rocephin - Associated leukocytosis of 15.0, trend in the a.m. - Continue to monitor  Hypothyroidism - Continue Synthroid - Given generalized weakness and altered mental status will check TSH  HLD (hyperlipidemia) - Continue statin      Advance Care Planning:   Code Status: Full Code  Consults: None at this time  Family Communication: Friend at bedside  Severity of Illness: The appropriate patient status for this patient is OBSERVATION. Observation status is judged to be reasonable and necessary in order to provide the required intensity of service to ensure the patient's safety. The patient's presenting symptoms, physical exam findings, and initial radiographic and laboratory data in the context of their medical condition is felt to place them at decreased risk for further clinical deterioration. Furthermore, it is anticipated that the patient will be medically stable for discharge from the hospital within 2 midnights of admission.   Author: Lilyan Gilford, DO 05/29/2023 4:13 AM  For on call review [email or web address].

## 2023-05-29 NOTE — Progress Notes (Addendum)
PROGRESS NOTE    Deanna Henderson  MWU:[id number] DOB: 10/11/1951 DOA: 05/28/2023 PCP: Pearson Grippe, MD   Brief Narrative:   Deanna Henderson is a 72 y.o. female with medical history significant of difficulty of ambulation due to balance, hypothyroidism, hyperlipidemia, dementia, and more presents the ED with a chief complaint of fall.  Patient reports that she fell on Sunday and laid on the floor until today when she was found.  However, she does not know what day today is, so the history that she fell on Sunday may not be reliable.  Awaiting PT/OT evaluation and currently undergoing empiric treatment for possible UTI.  Assessment & Plan:   Principal Problem:   Fall Active Problems:   HLD (hyperlipidemia)   Hypothyroidism   UTI (urinary tract infection)   Generalized weakness   Dehydration   Dementia (HCC)   Acute metabolic encephalopathy  Assessment and Plan:   Fall - No recollection of how she fell - Bruising on left face with left eye swollen shut - CT C-spine shows soft tissue swelling along the left side of the neck in the subcu fat possibly a contusion - CT head shows no acute intracranial abnormality - CT maxillofacial shows left periorbital soft tissue hematoma with no loculated collections - Patient does have a broken toe - She was given Tylenol and 1 L normal saline in the ED - Continue pain control with Tylenol and oxycodone - Continue to monitor - PT eval and treat -Likely need for SNF placement   Acute metabolic encephalopathy-improving - Likely related to infection/dehydration - CT head shows no acute intracranial abnormality - Treat UTI, continue IV hydration - Anticipate improvement with this treatment   Dementia (HCC) - Continue Aricept   Dehydration - Patient appeared clinically dry at presentation - She has a BUN to creatinine ratio of 32 : 0.95 - 1 L normal saline given in the ED - Continue IV hydration - Trend labs in a.m.   Generalized  weakness - Likely related to dehydration, UTI - Possibly also related to hypothyroidism - Continue to hydrate, treat infection, check TSH - PT eval and treat - Continue to monitor   UTI (urinary tract infection) - UA indicative of UTI - Urine culture pending - Start Rocephin - Associated leukocytosis of 15.0, trend in the a.m. - Continue to monitor   Hypothyroidism - Continue Synthroid - Given generalized weakness and altered mental status -TSH 0.563  Mild hypokalemia -Replete and reevaluate   HLD (hyperlipidemia) - Continue statin    DVT prophylaxis:Heparin Code Status: Full Family Communication: Berenice Primas at bedside Disposition Plan:  Status is: Observation The patient will require care spanning > 2 midnights and should be moved to inpatient because: Need for IV medications and fluid   Consultants:  None  Procedures:  None  Antimicrobials:  Anti-infectives (From admission, onward)    Start     Dose/Rate Route Frequency Ordered Stop   05/29/23 0130  cefTRIAXone (ROCEPHIN) 1 g in sodium chloride 0.9 % 100 mL IVPB        1 g 200 mL/hr over 30 Minutes Intravenous Every 24 hours 05/29/23 0118        Subjective: Patient seen and evaluated today with no new acute complaints or concerns. No acute concerns or events noted overnight.  Objective: Vitals:   05/29/23 0215 05/29/23 0415 05/29/23 0515 05/29/23 0625  BP: 110/62  (!) 102/54 115/66  Pulse: 93 95 95 100  Resp: 20 14  17   Temp:  98 F (36.7 C)  TempSrc:    Oral  SpO2: 98% 97% 96% 96%   No intake or output data in the 24 hours ending 05/29/23 0715 There were no vitals filed for this visit.  Examination:  General exam: Appears calm and comfortable, bruising with left periorbital swelling Respiratory system: Clear to auscultation. Respiratory effort normal. Cardiovascular system: S1 & S2 heard, RRR.  Gastrointestinal system: Abdomen is soft Central nervous system: Alert and awake Extremities:  No edema Skin: No significant lesions noted Psychiatry: Flat affect.    Data Reviewed: I have personally reviewed following labs and imaging studies  CBC: Recent Labs  Lab 05/28/23 1800 05/29/23 0615  WBC 15.0* 13.0*  NEUTROABS 13.6* 11.4*  HGB 14.2 12.4  HCT 42.5 36.5  MCV 98.6 95.5  PLT ACLMP 308   Basic Metabolic Panel: Recent Labs  Lab 05/28/23 1800 05/29/23 0615  NA 140 140  K 3.9 3.0*  CL 100 105  CO2 21* 23  GLUCOSE 136* 168*  BUN 32* 35*  CREATININE 0.95 0.81  CALCIUM 9.7 8.8*  MG  --  1.9   GFR: CrCl cannot be calculated (Unknown ideal weight.). Liver Function Tests: Recent Labs  Lab 05/28/23 1800 05/29/23 0615  AST 61* 49*  ALT 28 27  ALKPHOS 47 43  BILITOT 1.5* 0.9  PROT 6.8 5.9*  ALBUMIN 3.0* 2.6*   No results for input(s): "LIPASE", "AMYLASE" in the last 168 hours. No results for input(s): "AMMONIA" in the last 168 hours. Coagulation Profile: No results for input(s): "INR", "PROTIME" in the last 168 hours. Cardiac Enzymes: Recent Labs  Lab 05/28/23 1800  CKTOTAL 642*   BNP (last 3 results) No results for input(s): "PROBNP" in the last 8760 hours. HbA1C: No results for input(s): "HGBA1C" in the last 72 hours. CBG: No results for input(s): "GLUCAP" in the last 168 hours. Lipid Profile: No results for input(s): "CHOL", "HDL", "LDLCALC", "TRIG", "CHOLHDL", "LDLDIRECT" in the last 72 hours. Thyroid Function Tests: Recent Labs    05/29/23 0615  TSH 0.563   Anemia Panel: No results for input(s): "VITAMINB12", "FOLATE", "FERRITIN", "TIBC", "IRON", "RETICCTPCT" in the last 72 hours. Sepsis Labs: Recent Labs  Lab 05/28/23 1800  PROCALCITON 0.15    No results found for this or any previous visit (from the past 240 hour(s)).       Radiology Studies: DG Foot Complete Left  Result Date: 05/28/2023 CLINICAL DATA:  Pain after a fall EXAM: LEFT FOOT - COMPLETE 3+ VIEW COMPARISON:  None Available. FINDINGS: Diffuse bone  demineralization. Degenerative changes in the interphalangeal joints. Degenerative changes in the intertarsal joints. Prominent first toenail, possibly displaced. Suggestion of soft tissue injury to the plantar aspect of the first toe. No acute fracture or dislocation. Vascular calcifications. IMPRESSION: 1. No acute bony abnormalities. 2. Evidence of soft tissue injury to the left first toe with skin flap along the plantar surface and displaced nail. Electronically Signed   By: Burman Nieves M.D.   On: 05/28/2023 22:21   DG Foot Complete Right  Result Date: 05/28/2023 CLINICAL DATA:  Pain after a fall EXAM: RIGHT FOOT COMPLETE - 3+ VIEW COMPARISON:  None Available. FINDINGS: Diffuse bone demineralization. Oblique nondisplaced fracture at the plantar base of the proximal phalanx of the right first toe with intra-articular involvement. No other fractures are identified. No expansile or destructive bone lesions. Plantar calcaneal spur. Soft tissue swelling over the forefoot. IMPRESSION: Oblique fracture at the base of the proximal phalanx of the first toe with  articular involvement. Electronically Signed   By: Burman Nieves M.D.   On: 05/28/2023 22:19   DG Knee Complete 4 Views Right  Result Date: 05/28/2023 CLINICAL DATA:  Pain after a fall. EXAM: RIGHT KNEE - COMPLETE 4+ VIEW COMPARISON:  None Available. FINDINGS: Mild degenerative changes in the right knee with medial and lateral compartment narrowing and small osteophyte formation. Prominent osteophytes resulting in either bone on bone or coalition of the patellofemoral joint. No evidence of acute fracture or dislocation. No destructive bone lesions. No significant effusions. IMPRESSION: Mild degenerative changes with prominent osteophyte formation in the patellofemoral joint causing bone on bone or patellofemoral coalition. No acute bony abnormalities. No significant effusion. Electronically Signed   By: Burman Nieves M.D.   On: 05/28/2023 22:18    CT Maxillofacial Wo Contrast  Result Date: 05/28/2023 CLINICAL DATA:  Blunt facial trauma.  Fall.  Left eye swollen shut. EXAM: CT MAXILLOFACIAL WITHOUT CONTRAST TECHNIQUE: Multidetector CT imaging of the maxillofacial structures was performed. Multiplanar CT image reconstructions were also generated. RADIATION DOSE REDUCTION: This exam was performed according to the departmental dose-optimization program which includes automated exposure control, adjustment of the mA and/or kV according to patient size and/or use of iterative reconstruction technique. COMPARISON:  Tonight CT head 10/07/2007 FINDINGS: Osseous: No fracture or mandibular dislocation. No destructive process. Orbits: Globes and extraocular muscles appear intact and symmetrical. Sinuses: Paranasal sinuses and mastoid air cells are clear. Soft tissues: Left periorbital soft tissue swelling/hematoma. No retrobulbar involvement. Soft tissue infiltration along the subcutaneous fat anterior to the left maxilla and zygomatic arch likely contusion. Limited intracranial: No significant or unexpected finding. IMPRESSION: 1. Left periorbital soft tissue hematoma with soft tissue infiltration/contusion extending over the left face. No loculated collections. No retrobulbar involvement. 2. No acute or displaced fractures are demonstrated. Electronically Signed   By: Burman Nieves M.D.   On: 05/28/2023 21:23   CT Cervical Spine Wo Contrast  Result Date: 05/28/2023 CLINICAL DATA:  Neck trauma.  Fall. EXAM: CT CERVICAL SPINE WITHOUT CONTRAST TECHNIQUE: Multidetector CT imaging of the cervical spine was performed without intravenous contrast. Multiplanar CT image reconstructions were also generated. RADIATION DOSE REDUCTION: This exam was performed according to the departmental dose-optimization program which includes automated exposure control, adjustment of the mA and/or kV according to patient size and/or use of iterative reconstruction technique.  COMPARISON:  None Available. FINDINGS: Alignment: Normal alignment. Skull base and vertebrae: No acute fracture. No primary bone lesion or focal pathologic process. Soft tissues and spinal canal: No prevertebral soft tissue swelling. No abnormal paraspinal soft tissue mass or infiltration. There is infiltration in the subcutaneous fat over the left side of the neck. No loculated collections are identified. Changes are likely contusions. Disc levels: Degenerative changes with disc space narrowing and mild endplate osteophyte formation at C4-5 and C5-6 levels. Upper chest: Lung apices are clear. Calcification in the right thyroid gland, incompletely included and measuring 2 cm diameter. Other: None. IMPRESSION: 1. Normal alignment. Mild degenerative changes. No acute displaced fractures. 2. Soft tissue swelling along the left side of the neck in the subcutaneous fat possibly contusion. No loculated collections. 3. Incidental right thyroid nodule measuring 2 cm. Recommend non-emergent thyroid ultrasound. Reference: J Am Coll Radiol. 2015 Feb;12(2): 143-50 Electronically Signed   By: Burman Nieves M.D.   On: 05/28/2023 21:19   CT Head Wo Contrast  Result Date: 05/28/2023 CLINICAL DATA:  Head trauma, minor.  Fall.  Left eye swollen shut. EXAM: CT HEAD WITHOUT CONTRAST TECHNIQUE:  Contiguous axial images were obtained from the base of the skull through the vertex without intravenous contrast. RADIATION DOSE REDUCTION: This exam was performed according to the departmental dose-optimization program which includes automated exposure control, adjustment of the mA and/or kV according to patient size and/or use of iterative reconstruction technique. COMPARISON:  CT head 10/07/2007 FINDINGS: Brain: Diffuse cerebral atrophy. Ventricular dilatation consistent with central atrophy. Low-attenuation changes in the deep white matter consistent with small vessel ischemia. No abnormal extra-axial fluid collections. No mass effect  or midline shift. Gray-white matter junctions are distinct. Basal cisterns are not effaced. No acute intracranial hemorrhage. Vascular: No hyperdense vessel or unexpected calcification. Skull: Calvarium appears intact. Sinuses/Orbits: Paranasal sinuses and mastoid air cells are clear. Other: None. IMPRESSION: No acute intracranial abnormalities. Chronic atrophy and small vessel ischemic changes. Electronically Signed   By: Burman Nieves M.D.   On: 05/28/2023 21:15        Scheduled Meds:  donepezil  10 mg Oral Daily   heparin  5,000 Units Subcutaneous Q8H   levothyroxine  75 mcg Oral Q0600   simvastatin  40 mg Oral QHS   Continuous Infusions:  sodium chloride 150 mL/hr at 05/29/23 0145   cefTRIAXone (ROCEPHIN)  IV Stopped (05/29/23 0216)     LOS: 0 days    Time spent: 35 minutes    Breta Demedeiros Hoover Brunette, DO [facility]  If 7PM-7AM, please contact night-coverage [email or web address] 05/29/2023, 7:15 AM

## 2023-05-29 NOTE — Assessment & Plan Note (Signed)
-   Continue Synthroid - Given generalized weakness and altered mental status will check TSH

## 2023-05-30 DIAGNOSIS — W19XXXA Unspecified fall, initial encounter: Secondary | ICD-10-CM | POA: Diagnosis not present

## 2023-05-30 DIAGNOSIS — E860 Dehydration: Secondary | ICD-10-CM | POA: Diagnosis not present

## 2023-05-30 DIAGNOSIS — R531 Weakness: Secondary | ICD-10-CM | POA: Diagnosis not present

## 2023-05-30 LAB — CBC
HCT: 32.4 % — ABNORMAL LOW (ref 36.0–46.0)
Hemoglobin: 10.8 g/dL — ABNORMAL LOW (ref 12.0–15.0)
MCH: 32.2 pg (ref 26.0–34.0)
MCHC: 33.3 g/dL (ref 30.0–36.0)
MCV: 96.7 fL (ref 80.0–100.0)
Platelets: 289 10*3/uL (ref 150–400)
RBC: 3.35 MIL/uL — ABNORMAL LOW (ref 3.87–5.11)
RDW: 14.3 % (ref 11.5–15.5)
WBC: 10.7 10*3/uL — ABNORMAL HIGH (ref 4.0–10.5)
nRBC: 0 % (ref 0.0–0.2)

## 2023-05-30 LAB — BASIC METABOLIC PANEL
Anion gap: 9 (ref 5–15)
BUN: 30 mg/dL — ABNORMAL HIGH (ref 8–23)
CO2: 24 mmol/L (ref 22–32)
Calcium: 8.3 mg/dL — ABNORMAL LOW (ref 8.9–10.3)
Chloride: 106 mmol/L (ref 98–111)
Creatinine, Ser: 0.63 mg/dL (ref 0.44–1.00)
GFR, Estimated: >60 mL/min (ref >60)
Glucose, Bld: 114 mg/dL — ABNORMAL HIGH (ref 70–99)
Potassium: 3.5 mmol/L (ref 3.5–5.1)
Sodium: 139 mmol/L (ref 135–145)

## 2023-05-30 LAB — CK: Total CK: 165 U/L (ref 38–234)

## 2023-05-30 LAB — MAGNESIUM: Magnesium: 1.8 mg/dL (ref 1.7–2.4)

## 2023-05-30 LAB — URINE CULTURE

## 2023-05-30 NOTE — Care Management Important Message (Signed)
Important Message  Patient Details  Name: Deanna Henderson MRN: [id number] Date of Birth: 07-27-1951   Medicare Important Message Given:  N/A - LOS <3 / Initial given by admissions     [name] 05/30/2023, 4:10 PM

## 2023-05-30 NOTE — TOC Progression Note (Signed)
Transition of Care [facility]) - Progression Note    Patient Details  Name: Deanna Henderson MRN: [id number] Date of Birth: 07/31/1951  [facility] CM/SW Contact  Annice Needy, LCSW Phone Number: 05/30/2023, 2:06 PM  Clinical Narrative:    Patient provided bed offers. Patient wants to consider offers before making a decision.    Expected Discharge Plan: Skilled Nursing Facility Barriers to Discharge: Continued Medical Work up  Expected Discharge Plan and Services     Post Acute Care Choice: Skilled Nursing Facility Living arrangements for the past 2 months: Apartment                                       Social Determinants of Health (SDOH) Interventions SDOH Screenings   Food Insecurity: No Food Insecurity (05/29/2023)  Housing: Medium Risk (05/29/2023)  Transportation Needs: No Transportation Needs (05/29/2023)  Utilities: Not At Risk (05/29/2023)  Alcohol Screen: Low Risk  (11/16/2020)  Depression (PHQ2-9): Low Risk  (11/16/2020)  Financial Resource Strain: Low Risk  (11/16/2020)  Physical Activity: Insufficiently Active (11/16/2020)  Social Connections: Moderately Integrated (11/16/2020)  Stress: No Stress Concern Present (11/16/2020)  Tobacco Use: Low Risk  (05/28/2023)    Readmission Risk Interventions     No data to display

## 2023-05-30 NOTE — Progress Notes (Signed)
PROGRESS NOTE    NYOBI HERRIAGE  ZOX:[id number] DOB: 1951-08-22 DOA: 05/28/2023 PCP: Pearson Grippe, MD   Brief Narrative:   Deanna Henderson is a 72 y.o. female with medical history significant of difficulty of ambulation due to balance, hypothyroidism, hyperlipidemia, dementia, and more presents the ED with a chief complaint of fall.  Patient reports that she fell on Sunday and laid on the floor until today when she was found.  However, she does not know what day today is, so the history that she fell on Sunday may not be reliable.  PT recommending SNF placement and TOC working on this.  Currently undergoing empiric treatment for UTI with Rocephin with cultures pending.  Assessment & Plan:   Principal Problem:   Fall Active Problems:   HLD (hyperlipidemia)   Hypothyroidism   UTI (urinary tract infection)   Generalized weakness   Dehydration   Dementia (HCC)   Acute metabolic encephalopathy  Assessment and Plan:   Fall - No recollection of how she fell - Bruising on left face with left eye swollen shut - CT C-spine shows soft tissue swelling along the left side of the neck in the subcu fat possibly a contusion - CT head shows no acute intracranial abnormality - CT maxillofacial shows left periorbital soft tissue hematoma with no loculated collections - Patient does have a broken toe - She was given Tylenol and 1 L normal saline in the ED - Continue pain control with Tylenol and oxycodone - Continue to monitor - PT recommending SNF placement   Acute metabolic encephalopathy-improving - Likely related to infection/dehydration - CT head shows no acute intracranial abnormality - Treat UTI, continue IV hydration - Anticipate improvement with this treatment   Dementia (HCC) - Continue Aricept   Dehydration - Patient appeared clinically dry at presentation - She has a BUN to creatinine ratio of 32 : 0.95 - 1 L normal saline given in the ED - Continue IV hydration - Trend  labs in a.m.   Generalized weakness - Likely related to dehydration, UTI - Possibly also related to hypothyroidism - Continue to hydrate, treat infection, check TSH - PT eval and treat - Continue to monitor   UTI (urinary tract infection) - UA indicative of UTI - Urine culture pending - Start Rocephin - Associated leukocytosis of 15.0, trend in the a.m. - Continue to monitor   Hypothyroidism - Continue Synthroid - Given generalized weakness and altered mental status -TSH 0.563   HLD (hyperlipidemia) - Continue statin    DVT prophylaxis:Heparin Code Status: Full Family Communication: Berenice Primas at bedside Disposition Plan:  Status is: Inpatient Remains inpatient appropriate because: Ongoing need for IV medication    Consultants:  None  Procedures:  None  Antimicrobials:  Anti-infectives (From admission, onward)    Start     Dose/Rate Route Frequency Ordered Stop   05/29/23 0130  cefTRIAXone (ROCEPHIN) 1 g in sodium chloride 0.9 % 100 mL IVPB        1 g 200 mL/hr over 30 Minutes Intravenous Every 24 hours 05/29/23 0118        Subjective: Patient seen and evaluated today with no new acute complaints or concerns. No acute concerns or events noted overnight.  Objective: Vitals:   05/29/23 2035 05/30/23 0003 05/30/23 0457 05/30/23 1405  BP: (!) 120/55 (!) 116/56 124/60 (!) 112/48  Pulse: 89 85 79 78  Resp: 20 19 20 18   Temp: 98.1 F (36.7 C) 98 F (36.7 C) 98.4 F (36.9  C)   TempSrc:  Oral    SpO2: 97% 98% 96% 100%    Intake/Output Summary (Last 24 hours) at 05/30/2023 1415 Last data filed at 05/30/2023 0700 Gross per 24 hour  Intake 300 ml  Output 1100 ml  Net -800 ml   There were no vitals filed for this visit.  Examination:  General exam: Appears calm and comfortable, bruising with left periorbital swelling Respiratory system: Clear to auscultation. Respiratory effort normal. Cardiovascular system: S1 & S2 heard, RRR.  Gastrointestinal  system: Abdomen is soft Central nervous system: Alert and awake Extremities: No edema Skin: No significant lesions noted Psychiatry: Flat affect.    Data Reviewed: I have personally reviewed following labs and imaging studies  CBC: Recent Labs  Lab 05/28/23 1800 05/29/23 0615 05/30/23 0506  WBC 15.0* 13.0* 10.7*  NEUTROABS 13.6* 11.4*  --   HGB 14.2 12.4 10.8*  HCT 42.5 36.5 32.4*  MCV 98.6 95.5 96.7  PLT ACLMP 308 289   Basic Metabolic Panel: Recent Labs  Lab 05/28/23 1800 05/29/23 0615 05/30/23 0506  NA 140 140 139  K 3.9 3.0* 3.5  CL 100 105 106  CO2 21* 23 24  GLUCOSE 136* 168* 114*  BUN 32* 35* 30*  CREATININE 0.95 0.81 0.63  CALCIUM 9.7 8.8* 8.3*  MG  --  1.9 1.8   GFR: CrCl cannot be calculated (Unknown ideal weight.). Liver Function Tests: Recent Labs  Lab 05/28/23 1800 05/29/23 0615  AST 61* 49*  ALT 28 27  ALKPHOS 47 43  BILITOT 1.5* 0.9  PROT 6.8 5.9*  ALBUMIN 3.0* 2.6*   No results for input(s): "LIPASE", "AMYLASE" in the last 168 hours. No results for input(s): "AMMONIA" in the last 168 hours. Coagulation Profile: No results for input(s): "INR", "PROTIME" in the last 168 hours. Cardiac Enzymes: Recent Labs  Lab 05/28/23 1800 05/30/23 0506  CKTOTAL 642* 165   BNP (last 3 results) No results for input(s): "PROBNP" in the last 8760 hours. HbA1C: No results for input(s): "HGBA1C" in the last 72 hours. CBG: No results for input(s): "GLUCAP" in the last 168 hours. Lipid Profile: No results for input(s): "CHOL", "HDL", "LDLCALC", "TRIG", "CHOLHDL", "LDLDIRECT" in the last 72 hours. Thyroid Function Tests: Recent Labs    05/29/23 0615  TSH 0.563   Anemia Panel: No results for input(s): "VITAMINB12", "FOLATE", "FERRITIN", "TIBC", "IRON", "RETICCTPCT" in the last 72 hours. Sepsis Labs: Recent Labs  Lab 05/28/23 1800  PROCALCITON 0.15    No results found for this or any previous visit (from the past 240 hour(s)).        Radiology Studies: DG Foot Complete Left  Result Date: 05/28/2023 CLINICAL DATA:  Pain after a fall EXAM: LEFT FOOT - COMPLETE 3+ VIEW COMPARISON:  None Available. FINDINGS: Diffuse bone demineralization. Degenerative changes in the interphalangeal joints. Degenerative changes in the intertarsal joints. Prominent first toenail, possibly displaced. Suggestion of soft tissue injury to the plantar aspect of the first toe. No acute fracture or dislocation. Vascular calcifications. IMPRESSION: 1. No acute bony abnormalities. 2. Evidence of soft tissue injury to the left first toe with skin flap along the plantar surface and displaced nail. Electronically Signed   By: Burman Nieves M.D.   On: 05/28/2023 22:21   DG Foot Complete Right  Result Date: 05/28/2023 CLINICAL DATA:  Pain after a fall EXAM: RIGHT FOOT COMPLETE - 3+ VIEW COMPARISON:  None Available. FINDINGS: Diffuse bone demineralization. Oblique nondisplaced fracture at the plantar base of the proximal phalanx  of the right first toe with intra-articular involvement. No other fractures are identified. No expansile or destructive bone lesions. Plantar calcaneal spur. Soft tissue swelling over the forefoot. IMPRESSION: Oblique fracture at the base of the proximal phalanx of the first toe with articular involvement. Electronically Signed   By: Burman Nieves M.D.   On: 05/28/2023 22:19   DG Knee Complete 4 Views Right  Result Date: 05/28/2023 CLINICAL DATA:  Pain after a fall. EXAM: RIGHT KNEE - COMPLETE 4+ VIEW COMPARISON:  None Available. FINDINGS: Mild degenerative changes in the right knee with medial and lateral compartment narrowing and small osteophyte formation. Prominent osteophytes resulting in either bone on bone or coalition of the patellofemoral joint. No evidence of acute fracture or dislocation. No destructive bone lesions. No significant effusions. IMPRESSION: Mild degenerative changes with prominent osteophyte formation in  the patellofemoral joint causing bone on bone or patellofemoral coalition. No acute bony abnormalities. No significant effusion. Electronically Signed   By: Burman Nieves M.D.   On: 05/28/2023 22:18   CT Maxillofacial Wo Contrast  Result Date: 05/28/2023 CLINICAL DATA:  Blunt facial trauma.  Fall.  Left eye swollen shut. EXAM: CT MAXILLOFACIAL WITHOUT CONTRAST TECHNIQUE: Multidetector CT imaging of the maxillofacial structures was performed. Multiplanar CT image reconstructions were also generated. RADIATION DOSE REDUCTION: This exam was performed according to the departmental dose-optimization program which includes automated exposure control, adjustment of the mA and/or kV according to patient size and/or use of iterative reconstruction technique. COMPARISON:  Tonight CT head 10/07/2007 FINDINGS: Osseous: No fracture or mandibular dislocation. No destructive process. Orbits: Globes and extraocular muscles appear intact and symmetrical. Sinuses: Paranasal sinuses and mastoid air cells are clear. Soft tissues: Left periorbital soft tissue swelling/hematoma. No retrobulbar involvement. Soft tissue infiltration along the subcutaneous fat anterior to the left maxilla and zygomatic arch likely contusion. Limited intracranial: No significant or unexpected finding. IMPRESSION: 1. Left periorbital soft tissue hematoma with soft tissue infiltration/contusion extending over the left face. No loculated collections. No retrobulbar involvement. 2. No acute or displaced fractures are demonstrated. Electronically Signed   By: Burman Nieves M.D.   On: 05/28/2023 21:23   CT Cervical Spine Wo Contrast  Result Date: 05/28/2023 CLINICAL DATA:  Neck trauma.  Fall. EXAM: CT CERVICAL SPINE WITHOUT CONTRAST TECHNIQUE: Multidetector CT imaging of the cervical spine was performed without intravenous contrast. Multiplanar CT image reconstructions were also generated. RADIATION DOSE REDUCTION: This exam was performed according  to the departmental dose-optimization program which includes automated exposure control, adjustment of the mA and/or kV according to patient size and/or use of iterative reconstruction technique. COMPARISON:  None Available. FINDINGS: Alignment: Normal alignment. Skull base and vertebrae: No acute fracture. No primary bone lesion or focal pathologic process. Soft tissues and spinal canal: No prevertebral soft tissue swelling. No abnormal paraspinal soft tissue mass or infiltration. There is infiltration in the subcutaneous fat over the left side of the neck. No loculated collections are identified. Changes are likely contusions. Disc levels: Degenerative changes with disc space narrowing and mild endplate osteophyte formation at C4-5 and C5-6 levels. Upper chest: Lung apices are clear. Calcification in the right thyroid gland, incompletely included and measuring 2 cm diameter. Other: None. IMPRESSION: 1. Normal alignment. Mild degenerative changes. No acute displaced fractures. 2. Soft tissue swelling along the left side of the neck in the subcutaneous fat possibly contusion. No loculated collections. 3. Incidental right thyroid nodule measuring 2 cm. Recommend non-emergent thyroid ultrasound. Reference: J Am Coll Radiol. 2015 Feb;12(2): 143-50  Electronically Signed   By: Burman Nieves M.D.   On: 05/28/2023 21:19   CT Head Wo Contrast  Result Date: 05/28/2023 CLINICAL DATA:  Head trauma, minor.  Fall.  Left eye swollen shut. EXAM: CT HEAD WITHOUT CONTRAST TECHNIQUE: Contiguous axial images were obtained from the base of the skull through the vertex without intravenous contrast. RADIATION DOSE REDUCTION: This exam was performed according to the departmental dose-optimization program which includes automated exposure control, adjustment of the mA and/or kV according to patient size and/or use of iterative reconstruction technique. COMPARISON:  CT head 10/07/2007 FINDINGS: Brain: Diffuse cerebral atrophy.  Ventricular dilatation consistent with central atrophy. Low-attenuation changes in the deep white matter consistent with small vessel ischemia. No abnormal extra-axial fluid collections. No mass effect or midline shift. Gray-white matter junctions are distinct. Basal cisterns are not effaced. No acute intracranial hemorrhage. Vascular: No hyperdense vessel or unexpected calcification. Skull: Calvarium appears intact. Sinuses/Orbits: Paranasal sinuses and mastoid air cells are clear. Other: None. IMPRESSION: No acute intracranial abnormalities. Chronic atrophy and small vessel ischemic changes. Electronically Signed   By: Burman Nieves M.D.   On: 05/28/2023 21:15        Scheduled Meds:  donepezil  10 mg Oral Daily   heparin  5,000 Units Subcutaneous Q8H   levothyroxine  75 mcg Oral Q0600   Continuous Infusions:  cefTRIAXone (ROCEPHIN)  IV 1 g (05/30/23 0301)     LOS: 1 day    Time spent: 35 minutes    Hadassah Rana Hoover Brunette, DO [facility]  If 7PM-7AM, please contact night-coverage [email or web address] 05/30/2023, 2:15 PM

## 2023-05-30 NOTE — Progress Notes (Signed)
Patient has had no acute events overnight. Patient has had no c/o pain and has seem very and engaged with shift throughout night.  Vitals have been stable.

## 2023-05-31 DIAGNOSIS — W19XXXA Unspecified fall, initial encounter: Secondary | ICD-10-CM | POA: Diagnosis not present

## 2023-05-31 DIAGNOSIS — R531 Weakness: Secondary | ICD-10-CM | POA: Diagnosis not present

## 2023-05-31 DIAGNOSIS — E860 Dehydration: Secondary | ICD-10-CM | POA: Diagnosis not present

## 2023-05-31 LAB — CBC
HCT: 31.3 % — ABNORMAL LOW (ref 36.0–46.0)
Hemoglobin: 10.5 g/dL — ABNORMAL LOW (ref 12.0–15.0)
MCH: 32.9 pg (ref 26.0–34.0)
MCHC: 33.5 g/dL (ref 30.0–36.0)
MCV: 98.1 fL (ref 80.0–100.0)
Platelets: 279 10*3/uL (ref 150–400)
RBC: 3.19 MIL/uL — ABNORMAL LOW (ref 3.87–5.11)
RDW: 14.3 % (ref 11.5–15.5)
WBC: 11.9 10*3/uL — ABNORMAL HIGH (ref 4.0–10.5)
nRBC: 0 % (ref 0.0–0.2)

## 2023-05-31 LAB — BASIC METABOLIC PANEL
Anion gap: 8 (ref 5–15)
BUN: 19 mg/dL (ref 8–23)
CO2: 27 mmol/L (ref 22–32)
Calcium: 8.1 mg/dL — ABNORMAL LOW (ref 8.9–10.3)
Chloride: 101 mmol/L (ref 98–111)
Creatinine, Ser: 0.54 mg/dL (ref 0.44–1.00)
GFR, Estimated: >60 mL/min (ref >60)
Glucose, Bld: 102 mg/dL — ABNORMAL HIGH (ref 70–99)
Potassium: 3.6 mmol/L (ref 3.5–5.1)
Sodium: 136 mmol/L (ref 135–145)

## 2023-05-31 LAB — URINE CULTURE: Culture: 60000 — AB

## 2023-05-31 LAB — MAGNESIUM: Magnesium: 1.6 mg/dL — ABNORMAL LOW (ref 1.7–2.4)

## 2023-05-31 MED ORDER — MAGNESIUM SULFATE 2 GM/50ML IV SOLN
2.0000 g | Freq: Once | INTRAVENOUS | Status: AC
  Administered 2023-05-31: 2 g via INTRAVENOUS
  Filled 2023-05-31: qty 50

## 2023-05-31 NOTE — TOC Progression Note (Addendum)
Transition of Care [facility]) - Progression Note    Patient Details  Name: Deanna Henderson MRN: [id number] Date of Birth: 1951/09/29  [facility]) CM/SW Contact  Catalina Gravel, [place] Phone Number: 05/31/2023, 2:45 PM  Clinical Narrative:    Pt has a choice btw 2 SNF, CSW followed up with her 6/15. Pt stated she would like to accept [facility]. CSW send communication via Hub that pt accepting bed. CSW sent updated therapy notes. If  Pt ready for DC Sunday, they can accept her.  CSW will contact Debbie at [facility] today advising pt accepted bed offer and could be ready 06/01/23.    Expected Discharge Plan: Skilled Nursing Facility Barriers to Discharge: No Barriers Identified  Expected Discharge Plan and Services     Post Acute Care Choice: Skilled Nursing Facility Living arrangements for the past 2 months: Apartment                                       Social Determinants of Health (SDOH) Interventions SDOH Screenings   Food Insecurity: No Food Insecurity (05/29/2023)  Housing: Medium Risk (05/29/2023)  Transportation Needs: No Transportation Needs (05/29/2023)  Utilities: Not At Risk (05/29/2023)  Alcohol Screen: Low Risk  (11/16/2020)  Depression (PHQ2-9): Low Risk  (11/16/2020)  Financial Resource Strain: Low Risk  (11/16/2020)  Physical Activity: Insufficiently Active (11/16/2020)  Social Connections: Moderately Integrated (11/16/2020)  Stress: No Stress Concern Present (11/16/2020)  Tobacco Use: Low Risk  (05/28/2023)    Readmission Risk Interventions     No data to display

## 2023-05-31 NOTE — Progress Notes (Signed)
PROGRESS NOTE    Deanna Henderson  ZOX:[id number] DOB: 06/07/1951 DOA: 05/28/2023 PCP: Pearson Grippe, MD   Brief Narrative:   Deanna Henderson is a 72 y.o. female with medical history significant of difficulty of ambulation due to balance, hypothyroidism, hyperlipidemia, dementia, and more presents the ED with a chief complaint of fall.  Patient reports that she fell on Sunday and laid on the floor until today when she was found.  However, she does not know what day today is, so the history that she fell on Sunday may not be reliable.  PT recommending SNF placement and TOC working on this.  Currently undergoing empiric treatment for UTI with Rocephin with cultures showing E. coli.  Assessment & Plan:   Principal Problem:   Fall Active Problems:   HLD (hyperlipidemia)   Hypothyroidism   UTI (urinary tract infection)   Generalized weakness   Dehydration   Dementia (HCC)   Acute metabolic encephalopathy  Assessment and Plan:   Fall - No recollection of how she fell - Bruising on left face with left eye swollen shut - CT C-spine shows soft tissue swelling along the left side of the neck in the subcu fat possibly a contusion - CT head shows no acute intracranial abnormality - CT maxillofacial shows left periorbital soft tissue hematoma with no loculated collections - Patient does have a broken toe - She was given Tylenol and 1 L normal saline in the ED - Continue pain control with Tylenol and oxycodone - Continue to monitor - PT recommending SNF placement   Acute metabolic encephalopathy-improving - Likely related to infection/dehydration - CT head shows no acute intracranial abnormality - Treat UTI, continue IV hydration - Anticipate improvement with this treatment  Hypomagnesemia -Replete and reevaluate   Dementia (HCC) - Continue Aricept   Dehydration - Patient appeared clinically dry at presentation - She has a BUN to creatinine ratio of 32 : 0.95 - 1 L normal saline  given in the ED - Continue IV hydration - Trend labs in a.m.   Generalized weakness - Likely related to dehydration, UTI - Possibly also related to hypothyroidism - Continue to hydrate, treat infection, check TSH - PT eval and treat - Continue to monitor   E. coli UTI (urinary tract infection) -Continue Rocephin - Associated leukocytosis, improving. - Continue to monitor   Hypothyroidism - Continue Synthroid - Given generalized weakness and altered mental status -TSH 0.563   HLD (hyperlipidemia) - Continue statin    DVT prophylaxis:Heparin Code Status: Full Family Communication: Berenice Primas at bedside Disposition Plan:  Status is: Inpatient Remains inpatient appropriate because: Ongoing need for IV medication    Consultants:  None  Procedures:  None  Antimicrobials:  Anti-infectives (From admission, onward)    Start     Dose/Rate Route Frequency Ordered Stop   05/29/23 0130  cefTRIAXone (ROCEPHIN) 1 g in sodium chloride 0.9 % 100 mL IVPB        1 g 200 mL/hr over 30 Minutes Intravenous Every 24 hours 05/29/23 0118        Subjective: Patient seen and evaluated today with no new acute complaints or concerns. No acute concerns or events noted overnight.  Objective: Vitals:   05/30/23 1405 05/30/23 1938 05/31/23 0452 05/31/23 0851  BP: (!) 112/48 (!) 117/52 (!) 118/55 115/60  Pulse: 78 76 78 85  Resp: 18 18 18 18   Temp:  98.4 F (36.9 C) 98.9 F (37.2 C) 98.9 F (37.2 C)  TempSrc:  Oral  Oral Oral  SpO2: 100% 98% 96% 95%    Intake/Output Summary (Last 24 hours) at 05/31/2023 1025 Last data filed at 05/31/2023 0451 Gross per 24 hour  Intake --  Output 800 ml  Net -800 ml   There were no vitals filed for this visit.  Examination:  General exam: Appears calm and comfortable, bruising with left periorbital swelling Respiratory system: Clear to auscultation. Respiratory effort normal. Cardiovascular system: S1 & S2 heard, RRR.  Gastrointestinal  system: Abdomen is soft Central nervous system: Alert and awake Extremities: No edema Skin: No significant lesions noted Psychiatry: Flat affect.    Data Reviewed: I have personally reviewed following labs and imaging studies  CBC: Recent Labs  Lab 05/28/23 1800 05/29/23 0615 05/30/23 0506 05/31/23 0401  WBC 15.0* 13.0* 10.7* 11.9*  NEUTROABS 13.6* 11.4*  --   --   HGB 14.2 12.4 10.8* 10.5*  HCT 42.5 36.5 32.4* 31.3*  MCV 98.6 95.5 96.7 98.1  PLT ACLMP 308 289 279   Basic Metabolic Panel: Recent Labs  Lab 05/28/23 1800 05/29/23 0615 05/30/23 0506 05/31/23 0401  NA 140 140 139 136  K 3.9 3.0* 3.5 3.6  CL 100 105 106 101  CO2 21* 23 24 27   GLUCOSE 136* 168* 114* 102*  BUN 32* 35* 30* 19  CREATININE 0.95 0.81 0.63 0.54  CALCIUM 9.7 8.8* 8.3* 8.1*  MG  --  1.9 1.8 1.6*   GFR: CrCl cannot be calculated (Unknown ideal weight.). Liver Function Tests: Recent Labs  Lab 05/28/23 1800 05/29/23 0615  AST 61* 49*  ALT 28 27  ALKPHOS 47 43  BILITOT 1.5* 0.9  PROT 6.8 5.9*  ALBUMIN 3.0* 2.6*   No results for input(s): "LIPASE", "AMYLASE" in the last 168 hours. No results for input(s): "AMMONIA" in the last 168 hours. Coagulation Profile: No results for input(s): "INR", "PROTIME" in the last 168 hours. Cardiac Enzymes: Recent Labs  Lab 05/28/23 1800 05/30/23 0506  CKTOTAL 642* 165   BNP (last 3 results) No results for input(s): "PROBNP" in the last 8760 hours. HbA1C: No results for input(s): "HGBA1C" in the last 72 hours. CBG: No results for input(s): "GLUCAP" in the last 168 hours. Lipid Profile: No results for input(s): "CHOL", "HDL", "LDLCALC", "TRIG", "CHOLHDL", "LDLDIRECT" in the last 72 hours. Thyroid Function Tests: Recent Labs    05/29/23 0615  TSH 0.563   Anemia Panel: No results for input(s): "VITAMINB12", "FOLATE", "FERRITIN", "TIBC", "IRON", "RETICCTPCT" in the last 72 hours. Sepsis Labs: Recent Labs  Lab 05/28/23 1800  PROCALCITON  0.15    Recent Results (from the past 240 hour(s))  Urine Culture (for pregnant, neutropenic or urologic patients or patients with an indwelling urinary catheter)     Status: Abnormal   Collection Time: 05/29/23 12:20 AM   Specimen: Urine, Clean Catch  Result Value Ref Range Status   Specimen Description   Final    URINE, CLEAN CATCH Performed at [facility], [address], [place], [place] [address]    Special Requests   Final    NONE Performed at [facility], [address] [place], [place] [address]    Culture 60,000 COLONIES/mL ESCHERICHIA COLI (A)  Final   Report Status 05/31/2023 FINAL  Final   Organism ID, Bacteria ESCHERICHIA COLI (A)  Final      Susceptibility   Escherichia coli - MIC*    AMPICILLIN <=2 SENSITIVE Sensitive     CEFAZOLIN <=4 SENSITIVE Sensitive     CEFEPIME <=0.12 SENSITIVE Sensitive  CEFTRIAXONE <=0.25 SENSITIVE Sensitive     CIPROFLOXACIN <=0.25 SENSITIVE Sensitive     GENTAMICIN <=1 SENSITIVE Sensitive     IMIPENEM <=0.25 SENSITIVE Sensitive     NITROFURANTOIN <=16 SENSITIVE Sensitive     TRIMETH/SULFA <=20 SENSITIVE Sensitive     AMPICILLIN/SULBACTAM <=2 SENSITIVE Sensitive     PIP/TAZO <=4 SENSITIVE Sensitive     * 60,000 COLONIES/mL ESCHERICHIA COLI         Radiology Studies: No results found.      Scheduled Meds:  donepezil  10 mg Oral Daily   heparin  5,000 Units Subcutaneous Q8H   levothyroxine  75 mcg Oral Q0600   Continuous Infusions:  cefTRIAXone (ROCEPHIN)  IV Stopped (05/31/23 0202)     LOS: 2 days    Time spent: 35 minutes    Philopater Mucha Hoover Brunette, DO [facility]  If 7PM-7AM, please contact night-coverage [email or web address] 05/31/2023, 10:25 AM

## 2023-06-01 DIAGNOSIS — W19XXXA Unspecified fall, initial encounter: Secondary | ICD-10-CM | POA: Diagnosis not present

## 2023-06-01 DIAGNOSIS — R531 Weakness: Secondary | ICD-10-CM | POA: Diagnosis not present

## 2023-06-01 DIAGNOSIS — E860 Dehydration: Secondary | ICD-10-CM | POA: Diagnosis not present

## 2023-06-01 LAB — BASIC METABOLIC PANEL
Anion gap: 8 (ref 5–15)
BUN: 19 mg/dL (ref 8–23)
CO2: 27 mmol/L (ref 22–32)
Calcium: 8 mg/dL — ABNORMAL LOW (ref 8.9–10.3)
Chloride: 101 mmol/L (ref 98–111)
Creatinine, Ser: 0.49 mg/dL (ref 0.44–1.00)
GFR, Estimated: >60 mL/min (ref >60)
Glucose, Bld: 100 mg/dL — ABNORMAL HIGH (ref 70–99)
Potassium: 3.7 mmol/L (ref 3.5–5.1)
Sodium: 136 mmol/L (ref 135–145)

## 2023-06-01 LAB — MAGNESIUM: Magnesium: 2.1 mg/dL (ref 1.7–2.4)

## 2023-06-01 NOTE — TOC Transition Note (Signed)
Transition of Care [facility]) - CM/SW Discharge Note   Patient Details  Name: Deanna Henderson MRN: [id number] Date of Birth: October 26, 1951  [facility]) CM/SW Contact:  Catalina Gravel, LCSW Phone Number: 06/01/2023, 10:47 AM   Clinical Narrative:    CSW verified pt medical ready for DC to SNF.  CSW spoke with pt who is in agreement to transfer, she also identified which relative to contact.  CSW contacted Gelene Mink advising of transfer today.[facility] contacted by CSW once DC summary entered, to place pt on list.  [facility] has provided RR info and CSW shared with nurse. Pt DC.   Final next level of care: Skilled Nursing Facility ([facility]) Barriers to Discharge: No Barriers Identified   Patient Goals and CMS Choice   Choice offered to / list presented to : Patient (at risk case manger, Juliette Alcide)  Discharge Placement                Patient [facility] bed at:  [facility]) Patient to be transferred to facility by: [facility] Name of family member notified: Gelene Mink Patient and family notified of of transfer: 06/01/23  Discharge Plan and Services Additional resources added to the After Visit Summary for       Post Acute Care Choice: Skilled Nursing Facility                               Social Determinants of Health (SDOH) Interventions SDOH Screenings   Food Insecurity: No Food Insecurity (05/29/2023)  Housing: Medium Risk (05/29/2023)  Transportation Needs: No Transportation Needs (05/29/2023)  Utilities: Not At Risk (05/29/2023)  Alcohol Screen: Low Risk  (11/16/2020)  Depression (PHQ2-9): Low Risk  (11/16/2020)  Financial Resource Strain: Low Risk  (11/16/2020)  Physical Activity: Insufficiently Active (11/16/2020)  Social Connections: Moderately Integrated (11/16/2020)  Stress: No Stress Concern Present (11/16/2020)  Tobacco Use: Low Risk  (05/28/2023)     Readmission Risk Interventions     No data to display

## 2023-06-01 NOTE — Progress Notes (Signed)
Nsg Discharge Note  Admit Date:  05/28/2023 Discharge date: 06/01/2023   Deanna Henderson to be D/C'd Home per MD order.  AVS completed.   Patient/caregiver able to verbalize understanding.  Discharge Medication: Allergies as of 06/01/2023       Reactions   Codeine Nausea Only        Medication List     TAKE these medications    acetaminophen 500 MG tablet Commonly known as: TYLENOL Take 500 mg by mouth every 6 (six) hours as needed.   albuterol 108 (90 Base) MCG/ACT inhaler Commonly known as: VENTOLIN HFA PLEASE SEE ATTACHED FOR DETAILED DIRECTIONS   CALCIUM 600 HIGH POTENCY PO Take 600 mg by mouth 2 (two) times daily.   calcium carbonate 750 MG chewable tablet Commonly known as: TUMS EX Chew 1 tablet by mouth as needed.   Corn Starch Powd Apply topically.   [employer] SPECTRAVITE SENIOR PO Take by mouth daily.   donepezil 10 MG tablet Commonly known as: ARICEPT Take 10 mg by mouth daily.   Fish Oil 1200 MG Caps Take by mouth 3 (three) times daily.   fluticasone 50 MCG/ACT nasal spray Commonly known as: FLONASE Place 2 sprays into both nostrils daily.   hydrochlorothiazide 25 MG tablet Commonly known as: HYDRODIURIL 25 mg daily.   levothyroxine 75 MCG tablet Commonly known as: SYNTHROID Take 75 mcg by mouth daily before breakfast.   loratadine 10 MG tablet Commonly known as: CLARITIN TAKE 1 TABLET BY MOUTH DAILY FOR ALLERGIES   simvastatin 40 MG tablet Commonly known as: ZOCOR Take 40 mg by mouth at bedtime.   Vitamin D3 25 MCG (1000 UT) Caps Take by mouth daily.        Discharge Assessment: Vitals:   05/31/23 2149 06/01/23 0558  BP: (!) 118/54 (!) 116/53  Pulse: 87 69  Resp: 20 20  Temp: 99.1 F (37.3 C) 97.8 F (36.6 C)  SpO2: 96% 94%   Skin clean, dry and intact without evidence of skin break down, no evidence of skin tears noted. IV catheter discontinued intact. Site without signs and symptoms of complications - no redness or edema  noted at insertion site, patient denies c/o pain - only slight tenderness at site.  Dressing with slight pressure applied.  D/c Instructions-Education: Discharge instructions given to patient/family with verbalized understanding. D/c education completed with patient/family including follow up instructions, medication list, d/c activities limitations if indicated, with other d/c instructions as indicated by MD - patient able to verbalize understanding, all questions fully answered. Patient instructed to return to ED, call 911, or call MD for any changes in condition.  Patient escorted via WC, and D/C home via private auto.  Laurena Spies, RN 06/01/2023 10:43 AM

## 2023-06-01 NOTE — Discharge Summary (Signed)
Physician Discharge Summary  RA CLENDANIEL GHW:[id number] DOB: [birth year] DOA: 05/28/2023  PCP: Pearson Grippe, MD  Admit date: 05/28/2023  Discharge date: 06/01/2023  Admitted From:Home  Disposition:  SNF  Recommendations for Outpatient Follow-up:  Follow up with PCP in 1-2 weeks Continue on home medications as prior Completed antibiotic treatment for E. coli UTI while inpatient  Home Health: None  Equipment/Devices: None  Discharge Condition:Stable  CODE STATUS: Full  Diet recommendation: Heart Healthy  Brief/Interim Summary:  Deanna Henderson is a 72 y.o. female with medical history significant of difficulty of ambulation due to balance, hypothyroidism, hyperlipidemia, dementia, and more presents the ED with a chief complaint of fall.  Patient reports that she fell on Sunday and laid on the floor until today when she was found.  However, she does not know what day today is, so the history that she fell on Sunday may not be reliable.  She was noted to have E. coli UTI and has completed course of IV treatment while hospitalized inpatient.  PT recommending SNF placement and she now has a bed available and is stable for discharge today.  Discharge Diagnoses:  Principal Problem:   Fall Active Problems:   HLD (hyperlipidemia)   Hypothyroidism   UTI (urinary tract infection)   Generalized weakness   Dehydration   Dementia (HCC)   Acute metabolic encephalopathy  Principal discharge diagnosis: Acute metabolic encephalopathy with fall in the setting of dementia with noted E. coli UTI.  Discharge Instructions  Discharge Instructions     Diet - low sodium heart healthy   Complete by: As directed    Increase activity slowly   Complete by: As directed    No wound care   Complete by: As directed       Allergies as of 06/01/2023       Reactions   Codeine Nausea Only        Medication List     TAKE these medications    acetaminophen 500 MG tablet Commonly known as:  TYLENOL Take 500 mg by mouth every 6 (six) hours as needed.   albuterol 108 (90 Base) MCG/ACT inhaler Commonly known as: VENTOLIN HFA PLEASE SEE ATTACHED FOR DETAILED DIRECTIONS   CALCIUM 600 HIGH POTENCY PO Take 600 mg by mouth 2 (two) times daily.   calcium carbonate 750 MG chewable tablet Commonly known as: TUMS EX Chew 1 tablet by mouth as needed.   Corn Starch Powd Apply topically.   CVS SPECTRAVITE SENIOR PO Take by mouth daily.   donepezil 10 MG tablet Commonly known as: ARICEPT Take 10 mg by mouth daily.   Fish Oil 1200 MG Caps Take by mouth 3 (three) times daily.   fluticasone 50 MCG/ACT nasal spray Commonly known as: FLONASE Place 2 sprays into both nostrils daily.   hydrochlorothiazide 25 MG tablet Commonly known as: HYDRODIURIL 25 mg daily.   levothyroxine 75 MCG tablet Commonly known as: SYNTHROID Take 75 mcg by mouth daily before breakfast.   loratadine 10 MG tablet Commonly known as: CLARITIN TAKE 1 TABLET BY MOUTH DAILY FOR ALLERGIES   simvastatin 40 MG tablet Commonly known as: ZOCOR Take 40 mg by mouth at bedtime.   Vitamin D3 25 MCG (1000 UT) Caps Take by mouth daily.        Follow-up Information     Pearson Grippe, MD. Schedule an appointment as soon as possible for a visit in 1 week(s).   Specialty: Internal Medicine Contact information: [address]  [place] [address] [phone]                Allergies  Allergen Reactions   Codeine Nausea Only    Consultations: None   Procedures/Studies: DG Foot Complete Left  Result Date: 05/28/2023 CLINICAL DATA:  Pain after a fall EXAM: LEFT FOOT - COMPLETE 3+ VIEW COMPARISON:  None Available. FINDINGS: Diffuse bone demineralization. Degenerative changes in the interphalangeal joints. Degenerative changes in the intertarsal joints. Prominent first toenail, possibly displaced. Suggestion of soft tissue injury to the plantar aspect of the first toe. No acute  fracture or dislocation. Vascular calcifications. IMPRESSION: 1. No acute bony abnormalities. 2. Evidence of soft tissue injury to the left first toe with skin flap along the plantar surface and displaced nail. Electronically Signed   By: Burman Nieves M.D.   On: 05/28/2023 22:21   DG Foot Complete Right  Result Date: 05/28/2023 CLINICAL DATA:  Pain after a fall EXAM: RIGHT FOOT COMPLETE - 3+ VIEW COMPARISON:  None Available. FINDINGS: Diffuse bone demineralization. Oblique nondisplaced fracture at the plantar base of the proximal phalanx of the right first toe with intra-articular involvement. No other fractures are identified. No expansile or destructive bone lesions. Plantar calcaneal spur. Soft tissue swelling over the forefoot. IMPRESSION: Oblique fracture at the base of the proximal phalanx of the first toe with articular involvement. Electronically Signed   By: Burman Nieves M.D.   On: 05/28/2023 22:19   DG Knee Complete 4 Views Right  Result Date: 05/28/2023 CLINICAL DATA:  Pain after a fall. EXAM: RIGHT KNEE - COMPLETE 4+ VIEW COMPARISON:  None Available. FINDINGS: Mild degenerative changes in the right knee with medial and lateral compartment narrowing and small osteophyte formation. Prominent osteophytes resulting in either bone on bone or coalition of the patellofemoral joint. No evidence of acute fracture or dislocation. No destructive bone lesions. No significant effusions. IMPRESSION: Mild degenerative changes with prominent osteophyte formation in the patellofemoral joint causing bone on bone or patellofemoral coalition. No acute bony abnormalities. No significant effusion. Electronically Signed   By: Burman Nieves M.D.   On: 05/28/2023 22:18   CT Maxillofacial Wo Contrast  Result Date: 05/28/2023 CLINICAL DATA:  Blunt facial trauma.  Fall.  Left eye swollen shut. EXAM: CT MAXILLOFACIAL WITHOUT CONTRAST TECHNIQUE: Multidetector CT imaging of the maxillofacial structures was  performed. Multiplanar CT image reconstructions were also generated. RADIATION DOSE REDUCTION: This exam was performed according to the departmental dose-optimization program which includes automated exposure control, adjustment of the mA and/or kV according to patient size and/or use of iterative reconstruction technique. COMPARISON:  Tonight CT head 10/07/2007 FINDINGS: Osseous: No fracture or mandibular dislocation. No destructive process. Orbits: Globes and extraocular muscles appear intact and symmetrical. Sinuses: Paranasal sinuses and mastoid air cells are clear. Soft tissues: Left periorbital soft tissue swelling/hematoma. No retrobulbar involvement. Soft tissue infiltration along the subcutaneous fat anterior to the left maxilla and zygomatic arch likely contusion. Limited intracranial: No significant or unexpected finding. IMPRESSION: 1. Left periorbital soft tissue hematoma with soft tissue infiltration/contusion extending over the left face. No loculated collections. No retrobulbar involvement. 2. No acute or displaced fractures are demonstrated. Electronically Signed   By: Burman Nieves M.D.   On: 05/28/2023 21:23   CT Cervical Spine Wo Contrast  Result Date: 05/28/2023 CLINICAL DATA:  Neck trauma.  Fall. EXAM: CT CERVICAL SPINE WITHOUT CONTRAST TECHNIQUE: Multidetector CT imaging of the cervical spine was performed without intravenous contrast. Multiplanar CT image reconstructions were also generated.  RADIATION DOSE REDUCTION: This exam was performed according to the departmental dose-optimization program which includes automated exposure control, adjustment of the mA and/or kV according to patient size and/or use of iterative reconstruction technique. COMPARISON:  None Available. FINDINGS: Alignment: Normal alignment. Skull base and vertebrae: No acute fracture. No primary bone lesion or focal pathologic process. Soft tissues and spinal canal: No prevertebral soft tissue swelling. No abnormal  paraspinal soft tissue mass or infiltration. There is infiltration in the subcutaneous fat over the left side of the neck. No loculated collections are identified. Changes are likely contusions. Disc levels: Degenerative changes with disc space narrowing and mild endplate osteophyte formation at C4-5 and C5-6 levels. Upper chest: Lung apices are clear. Calcification in the right thyroid gland, incompletely included and measuring 2 cm diameter. Other: None. IMPRESSION: 1. Normal alignment. Mild degenerative changes. No acute displaced fractures. 2. Soft tissue swelling along the left side of the neck in the subcutaneous fat possibly contusion. No loculated collections. 3. Incidental right thyroid nodule measuring 2 cm. Recommend non-emergent thyroid ultrasound. Reference: J Am Coll Radiol. 2015 Feb;12(2): 143-50 Electronically Signed   By: Burman Nieves M.D.   On: 05/28/2023 21:19   CT Head Wo Contrast  Result Date: 05/28/2023 CLINICAL DATA:  Head trauma, minor.  Fall.  Left eye swollen shut. EXAM: CT HEAD WITHOUT CONTRAST TECHNIQUE: Contiguous axial images were obtained from the base of the skull through the vertex without intravenous contrast. RADIATION DOSE REDUCTION: This exam was performed according to the departmental dose-optimization program which includes automated exposure control, adjustment of the mA and/or kV according to patient size and/or use of iterative reconstruction technique. COMPARISON:  CT head 10/07/2007 FINDINGS: Brain: Diffuse cerebral atrophy. Ventricular dilatation consistent with central atrophy. Low-attenuation changes in the deep white matter consistent with small vessel ischemia. No abnormal extra-axial fluid collections. No mass effect or midline shift. Gray-white matter junctions are distinct. Basal cisterns are not effaced. No acute intracranial hemorrhage. Vascular: No hyperdense vessel or unexpected calcification. Skull: Calvarium appears intact. Sinuses/Orbits: Paranasal  sinuses and mastoid air cells are clear. Other: None. IMPRESSION: No acute intracranial abnormalities. Chronic atrophy and small vessel ischemic changes. Electronically Signed   By: Burman Nieves M.D.   On: 05/28/2023 21:15     Discharge Exam: Vitals:   05/31/23 2149 06/01/23 0558  BP: (!) 118/54 (!) 116/53  Pulse: 87 69  Resp: 20 20  Temp: 99.1 F (37.3 C) 97.8 F (36.6 C)  SpO2: 96% 94%   Vitals:   05/31/23 0851 05/31/23 1300 05/31/23 2149 06/01/23 0558  BP: 115/60 120/64 (!) 118/54 (!) 116/53  Pulse: 85 80 87 69  Resp: 18 18 20 20   Temp: 98.9 F (37.2 C) 98.1 F (36.7 C) 99.1 F (37.3 C) 97.8 F (36.6 C)  TempSrc: Oral Oral Oral Oral  SpO2: 95% 96% 96% 94%    General: Pt is alert, awake, not in acute distress Cardiovascular: RRR, S1/S2 +, no rubs, no gallops Respiratory: CTA bilaterally, no wheezing, no rhonchi Abdominal: Soft, NT, ND, bowel sounds + Extremities: no edema, no cyanosis    The results of significant diagnostics from this hospitalization (including imaging, microbiology, ancillary and laboratory) are listed below for reference.     Microbiology: Recent Results (from the past 240 hour(s))  Urine Culture (for pregnant, neutropenic or urologic patients or patients with an indwelling urinary catheter)     Status: Abnormal   Collection Time: 05/29/23 12:20 AM   Specimen: Urine, Clean Catch  Result Value  Ref Range Status   Specimen Description   Final    URINE, CLEAN CATCH Performed at [facility], [address] [place], [place] [address]    Special Requests   Final    NONE Performed at [facility], [address] [place], [place] [address]    Culture 60,000 COLONIES/mL ESCHERICHIA COLI (A)  Final   Report Status 05/31/2023 FINAL  Final   Organism ID, Bacteria ESCHERICHIA COLI (A)  Final      Susceptibility   Escherichia coli - MIC*    AMPICILLIN <=2 SENSITIVE Sensitive     CEFAZOLIN <=4 SENSITIVE Sensitive     CEFEPIME <=0.12 SENSITIVE  Sensitive     CEFTRIAXONE <=0.25 SENSITIVE Sensitive     CIPROFLOXACIN <=0.25 SENSITIVE Sensitive     GENTAMICIN <=1 SENSITIVE Sensitive     IMIPENEM <=0.25 SENSITIVE Sensitive     NITROFURANTOIN <=16 SENSITIVE Sensitive     TRIMETH/SULFA <=20 SENSITIVE Sensitive     AMPICILLIN/SULBACTAM <=2 SENSITIVE Sensitive     PIP/TAZO <=4 SENSITIVE Sensitive     * 60,000 COLONIES/mL ESCHERICHIA COLI     Labs: BNP (last 3 results) No results for input(s): "BNP" in the last 8760 hours. Basic Metabolic Panel: Recent Labs  Lab 05/28/23 1800 05/29/23 0615 05/30/23 0506 05/31/23 0401 06/01/23 0418  NA 140 140 139 136 136  K 3.9 3.0* 3.5 3.6 3.7  CL 100 105 106 101 101  CO2 21* 23 24 27 27   GLUCOSE 136* 168* 114* 102* 100*  BUN 32* 35* 30* 19 19  CREATININE 0.95 0.81 0.63 0.54 0.49  CALCIUM 9.7 8.8* 8.3* 8.1* 8.0*  MG  --  1.9 1.8 1.6* 2.1   Liver Function Tests: Recent Labs  Lab 05/28/23 1800 05/29/23 0615  AST 61* 49*  ALT 28 27  ALKPHOS 47 43  BILITOT 1.5* 0.9  PROT 6.8 5.9*  ALBUMIN 3.0* 2.6*   No results for input(s): "LIPASE", "AMYLASE" in the last 168 hours. No results for input(s): "AMMONIA" in the last 168 hours. CBC: Recent Labs  Lab 05/28/23 1800 05/29/23 0615 05/30/23 0506 05/31/23 0401  WBC 15.0* 13.0* 10.7* 11.9*  NEUTROABS 13.6* 11.4*  --   --   HGB 14.2 12.4 10.8* 10.5*  HCT 42.5 36.5 32.4* 31.3*  MCV 98.6 95.5 96.7 98.1  PLT ACLMP 308 289 279   Cardiac Enzymes: Recent Labs  Lab 05/28/23 1800 05/30/23 0506  CKTOTAL 642* 165   BNP: Invalid input(s): "POCBNP" CBG: No results for input(s): "GLUCAP" in the last 168 hours. D-Dimer No results for input(s): "DDIMER" in the last 72 hours. Hgb A1c No results for input(s): "HGBA1C" in the last 72 hours. Lipid Profile No results for input(s): "CHOL", "HDL", "LDLCALC", "TRIG", "CHOLHDL", "LDLDIRECT" in the last 72 hours. Thyroid function studies No results for input(s): "TSH", "T4TOTAL", "T3FREE",  "THYROIDAB" in the last 72 hours.  Invalid input(s): "FREET3" Anemia work up No results for input(s): "VITAMINB12", "FOLATE", "FERRITIN", "TIBC", "IRON", "RETICCTPCT" in the last 72 hours. Urinalysis    Component Value Date/Time   COLORURINE YELLOW 05/28/2023 0020   APPEARANCEUR HAZY (A) 05/28/2023 0020   LABSPEC 1.024 05/28/2023 0020   PHURINE 6.0 05/28/2023 0020   GLUCOSEU NEGATIVE 05/28/2023 0020   HGBUR MODERATE (A) 05/28/2023 0020   BILIRUBINUR NEGATIVE 05/28/2023 0020   KETONESUR 80 (A) 05/28/2023 0020   PROTEINUR 100 (A) 05/28/2023 0020   NITRITE NEGATIVE 05/28/2023 0020   LEUKOCYTESUR SMALL (A) 05/28/2023 0020   Sepsis Labs Recent Labs  Lab  05/28/23 1800 05/29/23 0615 05/30/23 0506 05/31/23 0401  WBC 15.0* 13.0* 10.7* 11.9*   Microbiology Recent Results (from the past 240 hour(s))  Urine Culture (for pregnant, neutropenic or urologic patients or patients with an indwelling urinary catheter)     Status: Abnormal   Collection Time: 05/29/23 12:20 AM   Specimen: Urine, Clean Catch  Result Value Ref Range Status   Specimen Description   Final    URINE, CLEAN CATCH Performed at [facility], [address], [place], [place] [address]    Special Requests   Final    NONE Performed at [facility], [address] [place], [place] [address]    Culture 60,000 COLONIES/mL ESCHERICHIA COLI (A)  Final   Report Status 05/31/2023 FINAL  Final   Organism ID, Bacteria ESCHERICHIA COLI (A)  Final      Susceptibility   Escherichia coli - MIC*    AMPICILLIN <=2 SENSITIVE Sensitive     CEFAZOLIN <=4 SENSITIVE Sensitive     CEFEPIME <=0.12 SENSITIVE Sensitive     CEFTRIAXONE <=0.25 SENSITIVE Sensitive     CIPROFLOXACIN <=0.25 SENSITIVE Sensitive     GENTAMICIN <=1 SENSITIVE Sensitive     IMIPENEM <=0.25 SENSITIVE Sensitive     NITROFURANTOIN <=16 SENSITIVE Sensitive     TRIMETH/SULFA <=20 SENSITIVE Sensitive     AMPICILLIN/SULBACTAM <=2 SENSITIVE Sensitive     PIP/TAZO <=4  SENSITIVE Sensitive     * 60,000 COLONIES/mL ESCHERICHIA COLI     Time coordinating discharge: 35 minutes  SIGNED:   Erick Blinks, DO [facility] 06/01/2023, 10:04 AM  If 7PM-7AM, please contact night-coverage [email or web address]

## 2023-06-02 NOTE — Progress Notes (Signed)
Patient discharged to [facility],report called and given to [facility]. Transported by EMS to awaiting facility. IV discontinued ,cathter intact.

## 2023-07-23 ENCOUNTER — Encounter (HOSPITAL_COMMUNITY): Payer: Self-pay

## 2023-07-23 ENCOUNTER — Emergency Department (HOSPITAL_COMMUNITY): Payer: Medicare Other

## 2023-07-23 ENCOUNTER — Inpatient Hospital Stay (HOSPITAL_COMMUNITY)
Admission: EM | Admit: 2023-07-23 | Discharge: 2023-07-28 | DRG: 682 | Disposition: A | Discharge status: Skilled Nursing Facility | Payer: Medicare Other | Source: Skilled Nursing Facility | Attending: Internal Medicine | Admitting: Internal Medicine

## 2023-07-23 ENCOUNTER — Other Ambulatory Visit: Payer: Self-pay

## 2023-07-23 DIAGNOSIS — R29810 Facial weakness: Secondary | ICD-10-CM | POA: Diagnosis present

## 2023-07-23 DIAGNOSIS — Z79890 Hormone replacement therapy (postmenopausal): Secondary | ICD-10-CM

## 2023-07-23 DIAGNOSIS — N3090 Cystitis, unspecified without hematuria: Secondary | ICD-10-CM

## 2023-07-23 DIAGNOSIS — E669 Obesity, unspecified: Secondary | ICD-10-CM | POA: Diagnosis present

## 2023-07-23 DIAGNOSIS — N179 Acute kidney failure, unspecified: Secondary | ICD-10-CM | POA: Diagnosis not present

## 2023-07-23 DIAGNOSIS — Z8249 Family history of ischemic heart disease and other diseases of the circulatory system: Secondary | ICD-10-CM

## 2023-07-23 DIAGNOSIS — R531 Weakness: Secondary | ICD-10-CM | POA: Diagnosis not present

## 2023-07-23 DIAGNOSIS — D630 Anemia in neoplastic disease: Secondary | ICD-10-CM | POA: Diagnosis present

## 2023-07-23 DIAGNOSIS — G9341 Metabolic encephalopathy: Secondary | ICD-10-CM | POA: Diagnosis not present

## 2023-07-23 DIAGNOSIS — Z853 Personal history of malignant neoplasm of breast: Secondary | ICD-10-CM

## 2023-07-23 DIAGNOSIS — Z923 Personal history of irradiation: Secondary | ICD-10-CM | POA: Diagnosis not present

## 2023-07-23 DIAGNOSIS — I4510 Unspecified right bundle-branch block: Secondary | ICD-10-CM | POA: Diagnosis present

## 2023-07-23 DIAGNOSIS — Z885 Allergy status to narcotic agent status: Secondary | ICD-10-CM | POA: Diagnosis not present

## 2023-07-23 DIAGNOSIS — D0510 Intraductal carcinoma in situ of unspecified breast: Secondary | ICD-10-CM | POA: Diagnosis present

## 2023-07-23 DIAGNOSIS — Z6835 Body mass index (BMI) 35.0-35.9, adult: Secondary | ICD-10-CM

## 2023-07-23 DIAGNOSIS — N136 Pyonephrosis: Secondary | ICD-10-CM | POA: Diagnosis present

## 2023-07-23 DIAGNOSIS — F0390 Unspecified dementia without behavioral disturbance: Secondary | ICD-10-CM | POA: Diagnosis present

## 2023-07-23 DIAGNOSIS — E039 Hypothyroidism, unspecified: Secondary | ICD-10-CM

## 2023-07-23 DIAGNOSIS — E7800 Pure hypercholesterolemia, unspecified: Secondary | ICD-10-CM | POA: Diagnosis present

## 2023-07-23 DIAGNOSIS — Z9012 Acquired absence of left breast and nipple: Secondary | ICD-10-CM | POA: Diagnosis not present

## 2023-07-23 DIAGNOSIS — B961 Klebsiella pneumoniae [K. pneumoniae] as the cause of diseases classified elsewhere: Secondary | ICD-10-CM | POA: Diagnosis present

## 2023-07-23 DIAGNOSIS — Z79899 Other long term (current) drug therapy: Secondary | ICD-10-CM

## 2023-07-23 DIAGNOSIS — M8580 Other specified disorders of bone density and structure, unspecified site: Secondary | ICD-10-CM | POA: Diagnosis present

## 2023-07-23 DIAGNOSIS — E869 Volume depletion, unspecified: Secondary | ICD-10-CM | POA: Diagnosis present

## 2023-07-23 DIAGNOSIS — Z87440 Personal history of urinary (tract) infections: Secondary | ICD-10-CM

## 2023-07-23 DIAGNOSIS — Z993 Dependence on wheelchair: Secondary | ICD-10-CM | POA: Diagnosis not present

## 2023-07-23 DIAGNOSIS — D649 Anemia, unspecified: Secondary | ICD-10-CM | POA: Diagnosis not present

## 2023-07-23 DIAGNOSIS — Z1612 Extended spectrum beta lactamase (ESBL) resistance: Secondary | ICD-10-CM | POA: Diagnosis present

## 2023-07-23 DIAGNOSIS — E876 Hypokalemia: Secondary | ICD-10-CM

## 2023-07-23 DIAGNOSIS — Z833 Family history of diabetes mellitus: Secondary | ICD-10-CM

## 2023-07-23 DIAGNOSIS — R9431 Abnormal electrocardiogram [ECG] [EKG]: Secondary | ICD-10-CM | POA: Diagnosis present

## 2023-07-23 DIAGNOSIS — C50919 Malignant neoplasm of unspecified site of unspecified female breast: Secondary | ICD-10-CM | POA: Diagnosis present

## 2023-07-23 DIAGNOSIS — N3001 Acute cystitis with hematuria: Secondary | ICD-10-CM | POA: Diagnosis not present

## 2023-07-23 LAB — DIFFERENTIAL
Abs Immature Granulocytes: 0.21 10*3/uL — ABNORMAL HIGH (ref 0.00–0.07)
Basophils Absolute: 0 10*3/uL (ref 0.0–0.1)
Basophils Relative: 0 %
Eosinophils Absolute: 0.1 10*3/uL (ref 0.0–0.5)
Eosinophils Relative: 0 %
Immature Granulocytes: 1 %
Lymphocytes Relative: 6 %
Lymphs Abs: 1 10*3/uL (ref 0.7–4.0)
Monocytes Absolute: 0.5 10*3/uL (ref 0.1–1.0)
Monocytes Relative: 3 %
Neutro Abs: 16.2 10*3/uL — ABNORMAL HIGH (ref 1.7–7.7)
Neutrophils Relative %: 90 %

## 2023-07-23 LAB — CBC
HCT: 26.9 % — ABNORMAL LOW (ref 36.0–46.0)
Hemoglobin: 8.7 g/dL — ABNORMAL LOW (ref 12.0–15.0)
MCH: 30 pg (ref 26.0–34.0)
MCHC: 32.3 g/dL (ref 30.0–36.0)
MCV: 92.8 fL (ref 80.0–100.0)
Platelets: 242 10*3/uL (ref 150–400)
RBC: 2.9 MIL/uL — ABNORMAL LOW (ref 3.87–5.11)
RDW: 15.6 % — ABNORMAL HIGH (ref 11.5–15.5)
WBC: 18 10*3/uL — ABNORMAL HIGH (ref 4.0–10.5)
nRBC: 0 % (ref 0.0–0.2)

## 2023-07-23 LAB — PROTIME-INR
INR: 1.1 (ref 0.8–1.2)
Prothrombin Time: 14.3 seconds (ref 11.4–15.2)

## 2023-07-23 LAB — ETHANOL: Alcohol, Ethyl (B): <10 mg/dL (ref <10)

## 2023-07-23 LAB — TSH: TSH: 29.728 u[IU]/mL — ABNORMAL HIGH (ref 0.350–4.500)

## 2023-07-23 LAB — APTT: aPTT: 23 seconds — ABNORMAL LOW (ref 24–36)

## 2023-07-23 LAB — COMPREHENSIVE METABOLIC PANEL
ALT: 42 U/L (ref 0–44)
AST: 57 U/L — ABNORMAL HIGH (ref 15–41)
Albumin: 2 g/dL — ABNORMAL LOW (ref 3.5–5.0)
Alkaline Phosphatase: 107 U/L (ref 38–126)
Anion gap: 17 — ABNORMAL HIGH (ref 5–15)
BUN: 102 mg/dL — ABNORMAL HIGH (ref 8–23)
CO2: 29 mmol/L (ref 22–32)
Calcium: 7 mg/dL — ABNORMAL LOW (ref 8.9–10.3)
Chloride: 85 mmol/L — ABNORMAL LOW (ref 98–111)
Creatinine, Ser: 3.28 mg/dL — ABNORMAL HIGH (ref 0.44–1.00)
GFR, Estimated: 14 mL/min — ABNORMAL LOW (ref >60)
Glucose, Bld: 160 mg/dL — ABNORMAL HIGH (ref 70–99)
Potassium: <2 mmol/L — CL (ref 3.5–5.1)
Sodium: 131 mmol/L — ABNORMAL LOW (ref 135–145)
Total Bilirubin: 0.4 mg/dL (ref 0.3–1.2)
Total Protein: 6 g/dL — ABNORMAL LOW (ref 6.5–8.1)

## 2023-07-23 LAB — GLUCOSE, CAPILLARY: Glucose-Capillary: 115 mg/dL — ABNORMAL HIGH (ref 70–99)

## 2023-07-23 LAB — MAGNESIUM: Magnesium: 1.7 mg/dL (ref 1.7–2.4)

## 2023-07-23 LAB — AMMONIA: Ammonia: <10 umol/L (ref 9–35)

## 2023-07-23 LAB — BRAIN NATRIURETIC PEPTIDE: B Natriuretic Peptide: 89 pg/mL (ref 0.0–100.0)

## 2023-07-23 LAB — CBG MONITORING, ED: Glucose-Capillary: 182 mg/dL — ABNORMAL HIGH (ref 70–99)

## 2023-07-23 MED ORDER — ONDANSETRON HCL 4 MG PO TABS: 4.0000 mg | ORAL_TABLET | Freq: Four times a day (QID) | ORAL | Status: DC | PRN

## 2023-07-23 MED ORDER — SODIUM CHLORIDE 0.9 % IV BOLUS
1000.0000 mL | Freq: Once | INTRAVENOUS | Status: AC
  Administered 2023-07-23: 1000 mL via INTRAVENOUS

## 2023-07-23 MED ORDER — LEVOTHYROXINE SODIUM 75 MCG PO TABS
75.0000 ug | ORAL_TABLET | Freq: Every day | ORAL | Status: DC
  Administered 2023-07-24 – 2023-07-25 (×2): 75 ug via ORAL
  Filled 2023-07-23 (×2): qty 1

## 2023-07-23 MED ORDER — ONDANSETRON HCL 4 MG/2ML IJ SOLN: 4.0000 mg | Freq: Four times a day (QID) | INTRAMUSCULAR | Status: DC | PRN

## 2023-07-23 MED ORDER — DONEPEZIL HCL 5 MG PO TABS
10.0000 mg | ORAL_TABLET | Freq: Every day | ORAL | Status: DC
  Administered 2023-07-23 – 2023-07-28 (×6): 10 mg via ORAL
  Filled 2023-07-23 (×6): qty 2

## 2023-07-23 MED ORDER — SODIUM CHLORIDE 0.9 % IV SOLN: INTRAVENOUS | Status: DC

## 2023-07-23 MED ORDER — POTASSIUM CHLORIDE CRYS ER 20 MEQ PO TBCR
40.0000 meq | EXTENDED_RELEASE_TABLET | Freq: Two times a day (BID) | ORAL | Status: DC
  Administered 2023-07-23: 40 meq via ORAL
  Filled 2023-07-23: qty 2

## 2023-07-23 MED ORDER — CHLORHEXIDINE GLUCONATE CLOTH 2 % EX PADS
6.0000 | MEDICATED_PAD | Freq: Every day | CUTANEOUS | Status: DC
  Administered 2023-07-23 – 2023-07-28 (×6): 6 via TOPICAL

## 2023-07-23 MED ORDER — HEPARIN SODIUM (PORCINE) 5000 UNIT/ML IJ SOLN
5000.0000 [IU] | Freq: Three times a day (TID) | INTRAMUSCULAR | Status: DC
  Administered 2023-07-23 – 2023-07-28 (×14): 5000 [IU] via SUBCUTANEOUS
  Filled 2023-07-23 (×15): qty 1

## 2023-07-23 MED ORDER — ACETAMINOPHEN 325 MG PO TABS
650.0000 mg | ORAL_TABLET | Freq: Four times a day (QID) | ORAL | Status: DC | PRN
  Administered 2023-07-25 – 2023-07-26 (×2): 650 mg via ORAL
  Filled 2023-07-23 (×3): qty 2

## 2023-07-23 MED ORDER — ACETAMINOPHEN 650 MG RE SUPP: 650.0000 mg | Freq: Four times a day (QID) | RECTAL | Status: DC | PRN

## 2023-07-23 MED ORDER — POTASSIUM CHLORIDE IN NACL 40-0.9 MEQ/L-% IV SOLN: INTRAVENOUS | Status: DC

## 2023-07-23 MED ORDER — POTASSIUM CHLORIDE 10 MEQ/100ML IV SOLN
10.0000 meq | INTRAVENOUS | Status: AC
  Administered 2023-07-23 (×2): 10 meq via INTRAVENOUS
  Filled 2023-07-23: qty 100

## 2023-07-23 MED ORDER — POTASSIUM CHLORIDE 10 MEQ/100ML IV SOLN
10.0000 meq | INTRAVENOUS | Status: AC
  Administered 2023-07-23: 10 meq via INTRAVENOUS
  Filled 2023-07-23 (×2): qty 100

## 2023-07-23 MED ORDER — POLYETHYLENE GLYCOL 3350 17 G PO PACK: 17.0000 g | PACK | Freq: Every day | ORAL | Status: DC | PRN

## 2023-07-23 MED ORDER — MAGNESIUM SULFATE 2 GM/50ML IV SOLN
2.0000 g | Freq: Once | INTRAVENOUS | Status: AC
  Administered 2023-07-23: 2 g via INTRAVENOUS
  Filled 2023-07-23: qty 50

## 2023-07-23 NOTE — Assessment & Plan Note (Addendum)
Elevated 3.28, baseline ~ 0.4.  Denies GI losses.  Does not appear dehydrated.  She is on HCTZ.  She has swelling to her lower extremity, not significantly pitting-she reports is chronic, neck veins appear mildly distended. - 1 L bolus given, continue N/s + 40 kcl 75cc/hr  -Hold hydrochlorothiazide -Check BNP

## 2023-07-23 NOTE — Progress Notes (Addendum)
Code stroke cart activated at 1409-pt already gone to CT. Tele neurology paged at 1410. Dr Wilford Corner on screen at 1412.  Deanna Henderson, Tele Stroke RN

## 2023-07-23 NOTE — ED Triage Notes (Signed)
Pt brought in by [facility] for possible stroke from [facility]. Per EMS pt has been having weakness for over two days now and today around 1345 pt known to have rt facial droop. Pt has hx of muscle weakness and unsteady gait per facility paperwork.

## 2023-07-23 NOTE — ED Provider Notes (Addendum)
Patient sent in from [facility] as a potential code stroke.  CT head without any acute findings MRI brain without any acute findings.  Patient's main complaint was somewhat generalized weakness somewhat bilateral.  On-call neurology teleneurology involved no evidence of acute stroke.  Patient does labs were delayed and does show a white count of 18,000 as well as a potassium of less than 2.  For this patient giving 2 rounds of 10 mEq of potassium and cardiac monitoring.  In addition patient's thyroid-stimulating hormone was 29 chest x-ray was negative.  Patient's GFR was 14 creatinine was 3.28 marked change from mid June.  Will contact hospitalist for admission.  CRITICAL CARE Performed by: Vanetta Mulders Total critical care time: 40 minutes Critical care time was exclusive of separately billable procedures and treating other patients. Critical care was necessary to treat or prevent imminent or life-threatening deterioration. Critical care was time spent personally by me on the following activities: development of treatment plan with patient and/or surrogate as well as nursing, discussions with consultants, evaluation of patient's response to treatment, examination of patient, obtaining history from patient or surrogate, ordering and performing treatments and interventions, ordering and review of laboratory studies, ordering and review of radiographic studies, pulse oximetry and re-evaluation of patient's condition.  Patient is alert here and will follow commands.  Suspect that the weakness is due to the severe hypokalemia.  Urinalysis is still pending.  Not sure why the white count is 18,000.  Patient's chest x-ray negative for source.  Urinalysis is a possibility for infection.  In addition patient will receive a liter bolus of normal saline as well as IV fluids for the acute kidney injury.      Vanetta Mulders, MD 07/23/23 1816    Vanetta Mulders, MD 07/23/23 [phone]

## 2023-07-23 NOTE — Assessment & Plan Note (Signed)
Nursing home resident.  Ambulates with walker + assistance also.

## 2023-07-23 NOTE — Assessment & Plan Note (Signed)
2011. S/p left mastectomy, and radiation therapy.  She was also on letrozole therapy.

## 2023-07-23 NOTE — ED Notes (Signed)
Patient transported to MRI 

## 2023-07-23 NOTE — ED Provider Notes (Signed)
[facility] Provider Note  CSN: [id number] Arrival date & time: 07/23/23 1405  Chief Complaint(s) Code Stroke  HPI Deanna Henderson is a 72 y.o. female with PMH breast cancer, obesity, dementia, deconditioning, frequent UTIs who presents emergency room for evaluation of weakness and altered mental status.  Patient was working with physical therapy today and noted to be significantly weaker than baseline.  Physical therapist noted some subtle left-sided facial weakness and upper extremity weakness and called EMS due to concern for potential stroke.  Last known well 2 PM.  Patient arrives encephalopathic and is unable to answer further questions given altered mental status.   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   Obesity 01/22/2012   Osteopenia 01/19/2015   Wears partial dentures    Patient Active Problem List   Diagnosis Date Noted   Hypokalemia 07/23/2023   AKI (acute kidney injury) (HCC) 07/23/2023   Anemia 07/23/2023   Fall 05/29/2023   HLD (hyperlipidemia) 05/29/2023   Hypothyroidism 05/29/2023   UTI (urinary tract infection) 05/29/2023   Generalized weakness 05/29/2023   Dehydration 05/29/2023   Dementia (HCC) 05/29/2023   Acute metabolic encephalopathy 05/29/2023   Osteoporosis 11/29/2021   Post-menopausal 09/20/2015   Axillary mass 01/19/2015   Obesity 01/22/2012   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 Authorizing Provider  acetaminophen (TYLENOL) 500 MG tablet Take 500 mg by mouth every 6 (six) hours as needed.    [provider]  albuterol  (VENTOLIN HFA) 108 (90 Base) MCG/ACT inhaler PLEASE SEE ATTACHED FOR DETAILED DIRECTIONS 10/28/21   [provider]  Calcium Carbonate (CALCIUM 600 HIGH POTENCY PO) Take 600 mg by mouth 2 (two) times daily.     [provider]  calcium carbonate (TUMS EX) 750 MG chewable tablet Chew 1 tablet by mouth as needed.    [provider]  Cholecalciferol (VITAMIN D3) 1000 UNITS CAPS Take by mouth daily.      [provider]  Corn Starch POWD Apply topically.    [provider]  donepezil (ARICEPT) 10 MG tablet Take 10 mg by mouth daily.      [provider]  fluticasone (FLONASE) 50 MCG/ACT nasal spray Place 2 sprays into both nostrils daily.    [provider]  hydrochlorothiazide (HYDRODIURIL) 25 MG tablet 25 mg daily.  12/17/14   [provider]  levothyroxine (SYNTHROID) 75 MCG tablet Take 75 mcg by mouth daily before breakfast.    [provider]  loratadine (CLARITIN) 10 MG tablet TAKE 1 TABLET BY MOUTH DAILY FOR ALLERGIES 10/22/18   [provider]  Multiple Vitamins-Minerals ([employer] SPECTRAVITE SENIOR PO) Take by mouth daily.      [provider]  Omega-3 Fatty Acids (FISH OIL) 1200 MG CAPS Take by mouth 3 (three) times daily.      [provider]  simvastatin (ZOCOR) 40 MG tablet Take 40 mg by mouth at bedtime.      [provider]  Past Surgical History Past Surgical History:  Procedure Laterality Date   MASTECTOMY, PARTIAL  2011   lt mod radical mastectomy   MULTIPLE TOOTH EXTRACTIONS     STRABISMUS SURGERY  07/24/2012   Procedure: REPAIR STRABISMUS BILATERAL;  Surgeon: Shara Blazing, MD;  Location: [facility];  Service: Ophthalmology;  Laterality: Bilateral;   Family History Family History  Problem Relation Age of Onset   Cancer Mother     Cancer Father    Hypertension Sister    Diabetes Sister    Cancer Sister     Social History Social History   Tobacco Use   Smoking status: Never   Smokeless tobacco: Never  Vaping Use   Vaping status: Never Used  Substance Use Topics   Alcohol use: No   Drug use: No   Allergies Codeine  Review of Systems Review of Systems  Unable to perform ROS: Mental status change    Physical Exam Vital Signs  I have reviewed the triage vital signs BP (!) 128/48 (BP Location: Left Arm)   Pulse 65   Temp 97.8 F (36.6 C) (Oral)   Resp 20   Ht 5\' 5"  (1.651 m)   Wt 96.5 kg   SpO2 100%   BMI 35.40 kg/m   Physical Exam Vitals and nursing note reviewed.  Constitutional:      General: She is not in acute distress.    Appearance: She is well-developed.  HENT:     Head: Normocephalic and atraumatic.  Eyes:     Conjunctiva/sclera: Conjunctivae normal.  Cardiovascular:     Rate and Rhythm: Normal rate and regular rhythm.     Heart sounds: No murmur heard. Pulmonary:     Effort: Pulmonary effort is normal. No respiratory distress.     Breath sounds: Normal breath sounds.  Abdominal:     Palpations: Abdomen is soft.     Tenderness: There is no abdominal tenderness.  Musculoskeletal:        General: No swelling.     Cervical back: Neck supple.  Skin:    General: Skin is warm and dry.     Capillary Refill: Capillary refill takes less than 2 seconds.  Neurological:     Mental Status: She is alert. She is disoriented.     Cranial Nerves: No cranial nerve deficit.     Sensory: No sensory deficit.     Motor: Weakness present.  Psychiatric:        Mood and Affect: Mood normal.     ED Results and Treatments Labs (all labs ordered are listed, but only abnormal results are displayed) Labs Reviewed  APTT - Abnormal; Notable for the following components:      Result Value   aPTT 23 (*)    All other components within normal limits  CBC - Abnormal; Notable for the  following components:   WBC 18.0 (*)    RBC 2.90 (*)    Hemoglobin 8.7 (*)    HCT 26.9 (*)    RDW 15.6 (*)    All other components within normal limits  DIFFERENTIAL - Abnormal; Notable for the following components:   Neutro Abs 16.2 (*)    Abs Immature Granulocytes 0.21 (*)    All other components within normal limits  COMPREHENSIVE METABOLIC PANEL - Abnormal; Notable for the following components:   Sodium 131 (*)    Potassium <2.0 (*)    Chloride 85 (*)    Glucose, Bld 160 (*)  BUN 102 (*)    Creatinine, Ser 3.28 (*)    Calcium 7.0 (*)    Total Protein 6.0 (*)    Albumin 2.0 (*)    AST 57 (*)    GFR, Estimated 14 (*)    Anion gap 17 (*)    All other components within normal limits  TSH - Abnormal; Notable for the following components:   TSH 29.728 (*)    All other components within normal limits  CBG MONITORING, ED - Abnormal; Notable for the following components:   Glucose-Capillary 182 (*)    All other components within normal limits  MRSA NEXT GEN BY PCR, NASAL  ETHANOL  PROTIME-INR  AMMONIA  MAGNESIUM  BRAIN NATRIURETIC PEPTIDE  RAPID URINE DRUG SCREEN, HOSP PERFORMED  URINALYSIS, ROUTINE W REFLEX MICROSCOPIC  T3  T4, FREE  VITAMIN B12  FOLATE  IRON AND TIBC  FERRITIN  RETICULOCYTES  BASIC METABOLIC PANEL  CBC                                                                                                                          Radiology DG Chest Port 1 View  Result Date: 07/23/2023 CLINICAL DATA:  Altered mental status. EXAM: PORTABLE CHEST 1 VIEW COMPARISON:  08/22/2010. FINDINGS: Elevated right hemidiaphragm. Clear lungs. Normal heart size. No mediastinal widening. No pleural effusion or pneumothorax. IMPRESSION: No evidence of acute cardiopulmonary disease. Elevated right hemidiaphragm. Electronically Signed   By: Orvan Falconer M.D.   On: 07/23/2023 17:11   MR BRAIN WO CONTRAST  Result Date: 07/23/2023 CLINICAL DATA:  Neuro deficit, acute, stroke  suspected. Left-sided facial droop and weakness. EXAM: MRI HEAD WITHOUT CONTRAST TECHNIQUE: Multiplanar, multiecho pulse sequences of the brain and surrounding structures were obtained without intravenous contrast. COMPARISON:  Head CT earlier same day. FINDINGS: Brain: Diffusion imaging does not show any acute or subacute infarction. Mild chronic small-vessel ischemic change affects the pons. No focal cerebellar insult. Cerebral hemispheres show moderate chronic small-vessel ischemic changes of the white matter and the right basal ganglia. No large vessel territory stroke. No mass, hemorrhage, hydrocephalus or extra-axial collection. Vascular: Major vessels at the base of the brain show flow. Skull and upper cervical spine: Negative Sinuses/Orbits: Clear/normal Other: Tiny amount of mastoid fluid on the right, probably not significant. IMPRESSION: No acute finding. Moderate chronic small-vessel ischemic changes of the pons and cerebral hemispheric white matter. Electronically Signed   By: Paulina Fusi M.D.   On: 07/23/2023 15:08   CT HEAD CODE STROKE WO CONTRAST  Result Date: 07/23/2023 CLINICAL DATA:  Code stroke. Neuro deficit, acute, stroke suspected. Left-sided facial droop and weakness. EXAM: CT HEAD WITHOUT CONTRAST TECHNIQUE: Contiguous axial images were obtained from the base of the skull through the vertex without intravenous contrast. RADIATION DOSE REDUCTION: This exam was performed according to the departmental dose-optimization program which includes automated exposure control, adjustment of the mA and/or kV according to patient size and/or use of iterative reconstruction technique. COMPARISON:  Head CT 05/28/2023. FINDINGS: Brain: No acute hemorrhage. Unchanged moderate chronic small-vessel disease. Cortical gray-white differentiation is otherwise preserved. Prominence of the ventricles and sulci within expected range for age. No hydrocephalus or extra-axial collection. No mass effect or midline  shift. Vascular: No hyperdense vessel or unexpected calcification. Skull: No calvarial fracture or suspicious bone lesion. Skull base is unremarkable. Sinuses/Orbits: No acute finding. Other: None. ASPECTS [facility] Stroke Program Early CT Score) - Ganglionic level infarction (caudate, lentiform nuclei, internal capsule, insula, M1-M3 cortex): 7 - Supraganglionic infarction (M4-M6 cortex): 3 Total score (0-10 with 10 being normal): 10 IMPRESSION: 1. No acute intracranial hemorrhage or evidence of acute large vessel territory infarct. ASPECT score is 10. 2. Stable moderate chronic small-vessel disease. Code stroke imaging results were communicated on 07/23/2023 at 2:30 pm to provider Dr. Audrie Lia via telephone, who verbally acknowledged these results. Electronically Signed   By: Orvan Falconer M.D.   On: 07/23/2023 14:30    Pertinent labs & imaging results that were available during my care of the patient were reviewed by me and considered in my medical decision making (see MDM for details).  Medications Ordered in ED Medications  potassium chloride 10 mEq in 100 mL IVPB ( Intravenous Canceled Entry 07/23/23 2003)  potassium chloride 10 mEq in 100 mL IVPB (0 mEq Intravenous Stopped 07/23/23 1950)  magnesium sulfate IVPB 2 g 50 mL (has no administration in time range)  potassium chloride SA (KLOR-CON M) CR tablet 40 mEq (has no administration in time range)  donepezil (ARICEPT) tablet 10 mg (has no administration in time range)  levothyroxine (SYNTHROID) tablet 75 mcg (has no administration in time range)  heparin injection 5,000 Units (has no administration in time range)  0.9 % NaCl with KCl 40 mEq / L  infusion (has no administration in time range)  acetaminophen (TYLENOL) tablet 650 mg (has no administration in time range)    Or  acetaminophen (TYLENOL) suppository 650 mg (has no administration in time range)  ondansetron (ZOFRAN) tablet 4 mg (has no administration in time range)    Or  ondansetron  (ZOFRAN) injection 4 mg (has no administration in time range)  polyethylene glycol (MIRALAX / GLYCOLAX) packet 17 g (has no administration in time range)  Chlorhexidine Gluconate Cloth 2 % PADS 6 each (has no administration in time range)  sodium chloride 0.9 % bolus 1,000 mL (1,000 mLs Intravenous New Bag/Given 07/23/23 1849)                                                                                                                                     Procedures .Critical Care  Performed by: Glendora Score, MD Authorized by: Glendora Score, MD   Critical care provider statement:    Critical care time (minutes):  30   Critical care was necessary to treat or prevent imminent or life-threatening deterioration of the following conditions:  CNS failure or compromise  Critical care was time spent personally by me on the following activities:  Development of treatment plan with patient or surrogate, discussions with consultants, evaluation of patient's response to treatment, examination of patient, ordering and review of laboratory studies, ordering and review of radiographic studies, ordering and performing treatments and interventions, pulse oximetry, re-evaluation of patient's condition and review of old charts   (including critical care time)  Medical Decision Making / ED Course   This patient presents to the ED for concern of altered mental status, weakness, this involves an extensive number of treatment options, and is a complaint that carries with it a high risk of complications and morbidity.  The differential diagnosis includes CVA, hemorrhagic stroke, mass, Todd's paralysis, seizure, electrolyte abnormality, encephalopathy, electrolyte abnormality, UTI  MDM: Patient seen emergency room for evaluation of generalized weakness and altered mental status.  Physical exam reveals an encephalopathic patient who is generally weak with weakness of both upper extremities.  Possible facial  droop on arrival.  Stroke alert was activated and initial stroke imaging is negative including CT head and MRI.  Patient evaluated by teleneurologist Dr. Wilford Corner and TNK was considered but ultimately deferred given nonfocal neurologic exam, low NIH.  patient presentation does appear to be more encephalopathic in nature and patient pending completion of laboratory evaluation at time of signout.  Please see provider signout for continuation of workup.   Additional history obtained:  -External records from outside source obtained and reviewed including: Chart review including previous notes, labs, imaging, consultation notes   Lab Tests: -I ordered, reviewed, and interpreted labs.   The pertinent results include:   Labs Reviewed  APTT - Abnormal; Notable for the following components:      Result Value   aPTT 23 (*)    All other components within normal limits  CBC - Abnormal; Notable for the following components:   WBC 18.0 (*)    RBC 2.90 (*)    Hemoglobin 8.7 (*)    HCT 26.9 (*)    RDW 15.6 (*)    All other components within normal limits  DIFFERENTIAL - Abnormal; Notable for the following components:   Neutro Abs 16.2 (*)    Abs Immature Granulocytes 0.21 (*)    All other components within normal limits  COMPREHENSIVE METABOLIC PANEL - Abnormal; Notable for the following components:   Sodium 131 (*)    Potassium <2.0 (*)    Chloride 85 (*)    Glucose, Bld 160 (*)    BUN 102 (*)    Creatinine, Ser 3.28 (*)    Calcium 7.0 (*)    Total Protein 6.0 (*)    Albumin 2.0 (*)    AST 57 (*)    GFR, Estimated 14 (*)    Anion gap 17 (*)    All other components within normal limits  TSH - Abnormal; Notable for the following components:   TSH 29.728 (*)    All other components within normal limits  CBG MONITORING, ED - Abnormal; Notable for the following components:   Glucose-Capillary 182 (*)    All other components within normal limits  MRSA NEXT GEN BY PCR, NASAL  ETHANOL   PROTIME-INR  AMMONIA  MAGNESIUM  BRAIN NATRIURETIC PEPTIDE  RAPID URINE DRUG SCREEN, HOSP PERFORMED  URINALYSIS, ROUTINE W REFLEX MICROSCOPIC  T3  T4, FREE  VITAMIN B12  FOLATE  IRON AND TIBC  FERRITIN  RETICULOCYTES  BASIC METABOLIC PANEL  CBC      EKG   EKG  Interpretation Date/Time:  Wednesday July 23 2023 16:36:31 EDT Ventricular Rate:  58 PR Interval:  137 QRS Duration:  158 QT Interval:  518 QTC Calculation: 509 R Axis:   -24  Text Interpretation: Sinus rhythm Right bundle branch block Probable left ventricular hypertrophy Prolonged QT interval Confirmed by Vanetta Mulders [id number]) on 07/23/2023 4:59:21 PM         Imaging Studies ordered: I ordered imaging studies including CT head, MRI brain I independently visualized and interpreted imaging. I agree with the radiologist interpretation   Medicines ordered and prescription drug management: Meds ordered this encounter  Medications   potassium chloride 10 mEq in 100 mL IVPB   sodium chloride 0.9 % bolus 1,000 mL   DISCONTD: 0.9 %  sodium chloride infusion   potassium chloride 10 mEq in 100 mL IVPB   magnesium sulfate IVPB 2 g 50 mL   potassium chloride SA (KLOR-CON M) CR tablet 40 mEq   donepezil (ARICEPT) tablet 10 mg   levothyroxine (SYNTHROID) tablet 75 mcg   heparin injection 5,000 Units   0.9 % NaCl with KCl 40 mEq / L  infusion   OR Linked Order Group    acetaminophen (TYLENOL) tablet 650 mg    acetaminophen (TYLENOL) suppository 650 mg   OR Linked Order Group    ondansetron (ZOFRAN) tablet 4 mg    ondansetron (ZOFRAN) injection 4 mg   polyethylene glycol (MIRALAX / GLYCOLAX) packet 17 g   Chlorhexidine Gluconate Cloth 2 % PADS 6 each    -I have reviewed the patients home medicines and have made adjustments as needed  Critical interventions Stroke activation and consideration of TN K  Consultations Obtained: I requested consultation with the stroke neurologist Dr. Wilford Corner,  and discussed  lab and imaging findings as well as pertinent plan - they recommend: MRI brain, encephalopathy workup, no TNK   Cardiac Monitoring: The patient was maintained on a cardiac monitor.  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  Please see provider signout note for continuation of workup.     Final Clinical Impression(s) / ED Diagnoses Final diagnoses:  Generalized weakness  Hypokalemia  AKI (acute kidney injury) [facility])     @PCDICTATION @    Glendora Score, MD 07/23/23 2138

## 2023-07-23 NOTE — Assessment & Plan Note (Signed)
Severe hypokalemia of less than 2.  EKG with QTc prolonged at 509.  No prior EKG to compare.  She is on hydrochlorothiazide. -Replete K -Give 2 g mag, and check magnesium

## 2023-07-23 NOTE — H&P (Signed)
History and Physical    Deanna Henderson:[id number] DOB: 02-Feb-1951 DOA: 07/23/2023  PCP: Pearson Grippe, MD   Patient coming from: [facility]  I have personally briefly reviewed patient's old medical records in [facility]  Chief Complaint: Weakness  HPI: Deanna Henderson is a 72 y.o. female with medical history significant for  Dementia, breast Cancer.  Patient was brought to the ED from nursing home with reports of recurrence of about 2 days duration, today staff noticed facial droop on the right, she was sent to the ED.   At the time of my evaluation she is awake and able to answer some questions, albeit responses are a bit delayed.  She denies vomiting, no loose stools, she reports good oral intake.  She ambulates with a walker but with also with someone assisting.  She reports chronic swelling to her lower extremities.  She reports urinary frequency, without pain and tells me she is on a fluid pill.    ED Course: temp 97.8. Heart rate 59-69.  Respirate rate 19-20.  Blood pressure systolic 95-109.  O2 sats greater than 94% on room air. Potassium less than 2.  Sodium 131.  Creatinine elevated at 3.28.  TSH elevated at 29.7.  Ammonia less than 10.  Leukocytosis of 18. Head CT and MRI negative for acute abnormality. Code stroke called on arrival to the ED.  Evaluated by tele-neurologist-likely metabolic etiology, stroke workup only if MRI positive for stroke. K supplementation started, tablets given, 1L bolus given and maintenance fluid at 125 started.  Review of Systems: As per HPI all other systems reviewed and negative.  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 She has never used smokeless tobacco. She reports that she does not drink alcohol and does not use drugs.  Allergies  Allergen Reactions   Codeine Nausea Only    Family History  Problem Relation Age of Onset   Cancer Mother    Cancer Father    Hypertension Sister    Diabetes Sister    Cancer Sister     Prior to Admission medications   Medication Sig Start Date End Date Taking? Authorizing Provider  acetaminophen (TYLENOL) 500 MG tablet Take 500 mg by mouth every 6 (six) hours as needed.    [provider]  albuterol (VENTOLIN HFA) 108 (90 Base) MCG/ACT inhaler PLEASE SEE ATTACHED FOR DETAILED DIRECTIONS 10/28/21   [provider]  Calcium Carbonate (CALCIUM 600 HIGH POTENCY PO) Take 600 mg by mouth 2 (two) times daily.     [provider]  calcium carbonate (TUMS EX) 750 MG chewable tablet Chew 1 tablet by mouth as needed.    [provider]  Cholecalciferol (VITAMIN D3) 1000 UNITS CAPS Take by mouth daily.  [provider]  Corn Starch POWD Apply topically.    [provider]  donepezil (ARICEPT) 10 MG tablet Take 10 mg by mouth daily.      [provider]  fluticasone (FLONASE) 50 MCG/ACT nasal spray Place 2 sprays into both nostrils daily.    [provider]  hydrochlorothiazide (HYDRODIURIL) 25 MG tablet 25 mg daily.  12/17/14   [provider]  levothyroxine (SYNTHROID) 75 MCG tablet Take 75 mcg by mouth daily before breakfast.    [provider]  loratadine (CLARITIN) 10 MG tablet TAKE 1 TABLET BY MOUTH DAILY FOR ALLERGIES 10/22/18   [provider]   Multiple Vitamins-Minerals ([employer]S SPECTRAVITE SENIOR PO) Take by mouth daily.      [provider]  Omega-3 Fatty Acids (FISH OIL) 1200 MG CAPS Take by mouth 3 (three) times daily.      [provider]  simvastatin (ZOCOR) 40 MG tablet Take 40 mg by mouth at bedtime.      [provider]    Physical Exam: Vitals:   07/23/23 1545 07/23/23 1600 07/23/23 1615 07/23/23 1715  BP: 104/65 (!) 109/54 (!) 101/50 98/77  Pulse: 68 61 (!) 59 69  Resp:  19 19   SpO2: 96% 97% 96% 94%  Weight:      Height:        Constitutional: NAD, calm, comfortable Vitals:   07/23/23 1545 07/23/23 1600 07/23/23 1615 07/23/23 1715  BP: 104/65 (!) 109/54 (!) 101/50 98/77  Pulse: 68 61 (!) 59 69  Resp:  19 19   SpO2: 96% 97% 96% 94%  Weight:      Height:       Eyes: PERRL, lids and conjunctivae normal ENMT: Mucous membranes are moist.   Neck: normal, supple, no masses, no thyromegaly, neck veins appear mildly distended, Respiratory: clear to auscultation bilaterally, no wheezing, no crackles. Normal respiratory effort. No accessory muscle use.  Cardiovascular: Regular rate and rhythm, no murmurs / rubs / gallops. Bilat feet with pitting edema, legs appear swollen but without pitting, Extremities warm. Abdomen: no tenderness, no masses palpated. No hepatosplenomegaly. Bowel sounds positive.  Musculoskeletal: no clubbing / cyanosis. No joint deformity upper and lower extremities.  Skin: no rashes, lesions, ulcers. No induration Neurologic: Patient slumped to the side, on repositioning patient no facial asymmetry noted on exam, good equal grip strength bilaterally, equal 3+/5 strength to bilateral lower extremity, - barely able to lift bilateral lower extremity against gravity Psychiatric: Appears slightly lethargic, but awake and able to answer some questions.  Oriented to person, but thinks she is at rehab, but notes she has been admitted for hypokalemia, she is also able to tell me  about her breast cancer hx.  Labs on Admission: I have personally reviewed following labs and imaging studies  CBC: Recent Labs  Lab 07/23/23 1537  WBC 18.0*  NEUTROABS 16.2*  HGB 8.7*  HCT 26.9*  MCV 92.8  PLT 242   Basic Metabolic Panel: Recent Labs  Lab 07/23/23 1537  NA 131*  K <2.0*  CL 85*  CO2 29  GLUCOSE 160*  BUN 102*  CREATININE 3.28*  CALCIUM 7.0*   GFR: Estimated Creatinine Clearance: 16.7 mL/min (A) (by C-G formula based on SCr of 3.28 mg/dL (H)). Liver Function Tests: Recent Labs  Lab 07/23/23 1537  AST 57*  ALT 42  ALKPHOS 107  BILITOT 0.4  PROT 6.0*  ALBUMIN 2.0*    Recent Labs  Lab 07/23/23 1613  AMMONIA <10  Coagulation Profile: Recent Labs  Lab 07/23/23 1537  INR 1.1   CBG: Recent Labs  Lab 07/23/23 1410  GLUCAP 182*   Thyroid Function Tests: Recent Labs    07/23/23 1537  TSH 29.728*   Radiological Exams on Admission: DG Chest Port 1 View  Result Date: 07/23/2023 CLINICAL DATA:  Altered mental status. EXAM: PORTABLE CHEST 1 VIEW COMPARISON:  08/22/2010. FINDINGS: Elevated right hemidiaphragm. Clear lungs. Normal heart size. No mediastinal widening. No pleural effusion or pneumothorax. IMPRESSION: No evidence of acute cardiopulmonary disease. Elevated right hemidiaphragm. Electronically Signed   By: Orvan Falconer M.D.   On: 07/23/2023 17:11   MR BRAIN WO CONTRAST  Result Date: 07/23/2023 CLINICAL DATA:  Neuro deficit, acute, stroke suspected. Left-sided facial droop and weakness. EXAM: MRI HEAD WITHOUT CONTRAST TECHNIQUE: Multiplanar, multiecho pulse sequences of the brain and surrounding structures were obtained without intravenous contrast. COMPARISON:  Head CT earlier same day. FINDINGS: Brain: Diffusion imaging does not show any acute or subacute infarction. Mild chronic small-vessel ischemic change affects the pons. No focal cerebellar insult. Cerebral hemispheres show moderate chronic small-vessel ischemic changes of the  white matter and the right basal ganglia. No large vessel territory stroke. No mass, hemorrhage, hydrocephalus or extra-axial collection. Vascular: Major vessels at the base of the brain show flow. Skull and upper cervical spine: Negative Sinuses/Orbits: Clear/normal Other: Tiny amount of mastoid fluid on the right, probably not significant. IMPRESSION: No acute finding. Moderate chronic small-vessel ischemic changes of the pons and cerebral hemispheric white matter. Electronically Signed   By: Paulina Fusi M.D.   On: 07/23/2023 15:08   CT HEAD CODE STROKE WO CONTRAST  Result Date: 07/23/2023 CLINICAL DATA:  Code stroke. Neuro deficit, acute, stroke suspected. Left-sided facial droop and weakness. EXAM: CT HEAD WITHOUT CONTRAST TECHNIQUE: Contiguous axial images were obtained from the base of the skull through the vertex without intravenous contrast. RADIATION DOSE REDUCTION: This exam was performed according to the departmental dose-optimization program which includes automated exposure control, adjustment of the mA and/or kV according to patient size and/or use of iterative reconstruction technique. COMPARISON:  Head CT 05/28/2023. FINDINGS: Brain: No acute hemorrhage. Unchanged moderate chronic small-vessel disease. Cortical gray-white differentiation is otherwise preserved. Prominence of the ventricles and sulci within expected range for age. No hydrocephalus or extra-axial collection. No mass effect or midline shift. Vascular: No hyperdense vessel or unexpected calcification. Skull: No calvarial fracture or suspicious bone lesion. Skull base is unremarkable. Sinuses/Orbits: No acute finding. Other: None. ASPECTS [facility] Stroke Program Early CT Score) - Ganglionic level infarction (caudate, lentiform nuclei, internal capsule, insula, M1-M3 cortex): 7 - Supraganglionic infarction (M4-M6 cortex): 3 Total score (0-10 with 10 being normal): 10 IMPRESSION: 1. No acute intracranial hemorrhage or evidence of acute  large vessel territory infarct. ASPECT score is 10. 2. Stable moderate chronic small-vessel disease. Code stroke imaging results were communicated on 07/23/2023 at 2:30 pm to provider Dr. Audrie Lia via telephone, who verbally acknowledged these results. Electronically Signed   By: Orvan Falconer M.D.   On: 07/23/2023 14:30    EKG: Independently reviewed.  Sinus rhythm, rate 58, RBBB.  QTc prolonged 509.  No prior EKG to compare.  Nonspecific T wave changes diffusely.  Assessment/Plan Principal Problem:   Hypokalemia Active Problems:   Acute metabolic encephalopathy   AKI (acute kidney injury) (HCC)   Hypothyroidism   Generalized weakness   Dementia (HCC)   Assessment and Plan: * Hypokalemia Severe hypokalemia of less than 2.  EKG with QTc prolonged  at 509.  No prior EKG to compare.  She is on hydrochlorothiazide. -Replete K -Give 2 g mag, and check magnesium   AKI (acute kidney injury) (HCC) Elevated 3.28, baseline ~ 0.4.  Denies GI losses.  Does not appear dehydrated.  She is on HCTZ.  She has swelling to her lower extremity, not significantly pitting-she reports is chronic, neck veins appear mildly distended. - 1 L bolus given, continue N/s + 40 kcl 75cc/hr  -Hold hydrochlorothiazide -Check BNP  Acute metabolic encephalopathy Lethargic, but currently awake. At baseline she has a history of dementia.  Head CT, MRI negative for stroke.  Reported initial concern for left facial droop, not appreciated on my evaluation.  Likely metabolic etiology-considering AKI, leukocytosis of 18, UTI not ruled out.  Chest x-ray clear.   -Hydrate -Follow-up UA, in and out cath   Anemia Hgb 8.7, gradual downtrend from baseline from 13- a year ago. -Anemia panel in the a.m. - Trend Hgb for now  Generalized weakness Likely secondary to severe hypokalemia of less than 2. - Follow up UA  Hypothyroidism TSH elevated at 29.7 today, was WNL-just a month ago at 0.5.  Elevated TSH in the setting of acute  illness. Doubt this level of TSH would contribute to encephalopathy.   -T3, free T4 -Resume Synthroid at home dose 75 mcg daily for now  Dementia [facility]) Nursing home resident.  Ambulates with walker + assistance also.  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  She was also on letrozole therapy.   DVT prophylaxis: Heparin Code Status: Full code, no documentation at bedside from nursing home stated otherwise. Family Communication: None at bedside. Disposition Plan: ~ 2 days Consults called: None Admission status:  Inpt Stepdown I certify that at the point of admission it is my clinical judgment that the patient will require inpatient hospital care spanning beyond 2 midnights from the point of admission due to high intensity of service, high risk for further deterioration and high frequency of surveillance required.   Author: Onnie Boer, MD 07/23/2023 7:55 PM  For on call review [email or web address].

## 2023-07-23 NOTE — Assessment & Plan Note (Signed)
Hgb 8.7, gradual downtrend from baseline from 13- a year ago. -Anemia panel in the a.m. - Trend Hgb for now

## 2023-07-23 NOTE — Consult Note (Signed)
[facility] Telemedicine Consult   Requesting Provider: Dr. Erasmo Downer Consult Participants: Dr. Marthe Patch, Telespecialist RN Misty   bedside RN Inetta Fermo Location of the provider: [facility] Location of the patient: Deanna Henderson bed 11 in the ER  This consult was provided via telemedicine with 2-way video and audio communication. The patient/family was informed that care would be provided in this way and agreed to receive care in this manner.   Chief Complaint: Weakness  HPI: 72 year old with past medical history of breast cancer, obesity, dementia, immobility likely wheelchair-bound based on outside records, hypercholesterolemia, frequent UTIs brought in for evaluation of weakness and altered mental status. Code stroke was activated because of a concern for possible subtle left-sided facial weakness noted at around 2 PM at the nursing facility. Per report, she has been gradually getting weaker than her baseline for the past 2 days but this afternoon was noted to be more weaker than usual and was brought in for emergent evaluation and because of the question of the left facial droop noted at the facility, code stroke was activated. Patient is unable to provide any history at this time that she is encephalopathic. There is no collateral at this time.   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  Current Outpatient Medications:    acetaminophen (TYLENOL) 500 MG tablet, Take 500 mg by mouth every 6 (six) hours as needed., Disp: , Rfl:    albuterol (VENTOLIN HFA) 108 (90 Base) MCG/ACT inhaler, PLEASE SEE ATTACHED FOR DETAILED DIRECTIONS,  Disp: , Rfl:    Calcium Carbonate (CALCIUM 600 HIGH POTENCY PO), Take 600 mg by mouth 2 (two) times daily. , Disp: , Rfl:    calcium carbonate (TUMS EX) 750 MG chewable tablet, Chew 1 tablet by mouth as needed., Disp: , Rfl:    Cholecalciferol (VITAMIN D3) 1000 UNITS CAPS, Take by mouth daily.  , Disp: , Rfl:    Corn Starch POWD, Apply topically., Disp: , Rfl:    donepezil (ARICEPT) 10 MG tablet, Take 10 mg by mouth daily.  , Disp: , Rfl:    fluticasone (FLONASE) 50 MCG/ACT nasal spray, Place 2 sprays into both nostrils daily., Disp: , Rfl:    hydrochlorothiazide (HYDRODIURIL) 25 MG tablet, 25 mg daily. , Disp: , Rfl: 2   levothyroxine (SYNTHROID) 75 MCG tablet, Take 75 mcg by mouth daily before breakfast., Disp: , Rfl:    loratadine (CLARITIN) 10 MG tablet, TAKE 1 TABLET BY MOUTH DAILY FOR ALLERGIES, Disp: , Rfl: 3   Multiple Vitamins-Minerals ([employer]S SPECTRAVITE SENIOR PO), Take by mouth daily.  , Disp: , Rfl:    Omega-3 Fatty Acids (FISH OIL) 1200 MG CAPS, Take by mouth 3 (three) times daily.  , Disp: , Rfl:    simvastatin (ZOCOR) 40 MG tablet, Take 40 mg by mouth at bedtime.  , Disp: , Rfl:     LKW: Likely 2 days ago IV thrombolysis given?: No, outside the window, nonfocal exam IR Thrombectomy? No, poor MRIs, outside the window Modified Rankin Scale: 4-Needs assistance to walk and tend to bodily needs Time of teleneurologist evaluation: 1412 hrs.  Exam: There were no vitals filed for this visit.  General: She appears awake alert.  Slow to respond to questions. Psychomotor retardation Follow simple commands Unable to tell me her age or month Poor attention concentration Cranial nerve II to XII intact Motor examination with antigravity without drift bilateral upper extremities.  Able to wiggle toes to command bilaterally but unable to lift legs.  Legs also have moderate pitting edema. Sensation: Intact Coordination difficult to assess    NIHSS 1A: Level of Consciousness - 0 1B:  Ask Month and Age - 2 1C: 'Blink Eyes' & 'Squeeze Hands' - 0 2: Test Horizontal Extraocular Movements - 0 3: Test Visual Fields - 0 4: Test Facial Palsy - 0 5A: Test Left Arm Motor Drift - 0 5B: Test Right Arm Motor Drift - 0 6A: Test Left Leg Motor Drift - 3 6B: Test Right Leg Motor Drift - 3 7: Test Limb Ataxia - 0 8: Test Sensation - 0 9: Test Language/Aphasia- 2 10: Test Dysarthria - 0 11: Test Extinction/Inattention - 0 NIHSS score: 10   Imaging Reviewed: Personally reviewed. Agree with radiology interpretation. Noncontrasted head CT no acute findings, ASPECTS 10  Labs reviewed in epic and pertinent values follow: Labs pending.  Prior labs reviewed. CBC    Component Value Date/Time   WBC 11.9 (H) 05/31/2023 0401   RBC 3.19 (L) 05/31/2023 0401   HGB 10.5 (L) 05/31/2023 0401   HCT 31.3 (L) 05/31/2023 0401   PLT 279 05/31/2023 0401   MCV 98.1 05/31/2023 0401   MCH 32.9 05/31/2023 0401   MCHC 33.5 05/31/2023 0401   RDW 14.3 05/31/2023 0401   LYMPHSABS 0.6 (L) 05/29/2023 0615   MONOABS 0.8 05/29/2023 0615   EOSABS 0.0 05/29/2023 0615   BASOSABS 0.0 05/29/2023 0615   CMP     Component Value Date/Time   NA 136 06/01/2023 0418   K 3.7 06/01/2023 0418   CL 101 06/01/2023 0418   CO2 27 06/01/2023 0418   GLUCOSE 100 (H) 06/01/2023 0418   BUN 19 06/01/2023 0418   CREATININE 0.49 06/01/2023 0418   CALCIUM 8.0 (L) 06/01/2023 0418   PROT 5.9 (L) 05/29/2023 0615   ALBUMIN 2.6 (L) 05/29/2023 0615   AST 49 (H) 05/29/2023 0615   ALT 27 05/29/2023 0615   ALKPHOS 43 05/29/2023 0615   BILITOT 0.9 05/29/2023 0615   GFRNONAA >60 06/01/2023 0418   GFRAA >60 08/20/2016 0929     Assessment: 72 year old with above past medical history brought in for gradually worsening weakness for the past 2 days with possible left facial droop noted sometime at 2 PM today with unclear last known well. Outside the window for IV thrombolysis.  Also outside the window for endovascular thrombectomy  and a poor candidate because of poor modified Rankin anyway. History and examination more consistent with toxic metabolic encephalopathy-question underlying UTI.  MRI would be helpful to evaluate for stroke.  Recommendations:  MRI of the brain without contrast-pursue stroke workup only if positive for stroke. Check chest x-ray Check urinalysis Check CBC BMP Management of toxic metabolic derangements as found on testing per primary team. Neurology will be available as needed. Plan discussed with Dr. Posey Rea - EDP   -- Milon Dikes, MD Neurologist [facility] Pager: [phone]

## 2023-07-23 NOTE — Assessment & Plan Note (Signed)
Likely secondary to severe hypokalemia of less than 2. - Follow up UA

## 2023-07-23 NOTE — Assessment & Plan Note (Addendum)
Lethargic, but currently awake. At baseline she has a history of dementia.  Head CT, MRI negative for stroke.  Reported initial concern for left facial droop, not appreciated on my evaluation.  Likely metabolic etiology-considering AKI, leukocytosis of 18, UTI not ruled out.  Chest x-ray clear.   -Hydrate -Follow-up UA, in and out cath

## 2023-07-23 NOTE — Assessment & Plan Note (Signed)
TSH elevated at 29.7 today, was WNL-just a month ago at 0.5.  Elevated TSH in the setting of acute illness. Doubt this level of TSH would contribute to encephalopathy.   -T3, free T4 -Resume Synthroid at home dose 75 mcg daily for now

## 2023-07-24 ENCOUNTER — Inpatient Hospital Stay (HOSPITAL_COMMUNITY): Payer: Medicare Other

## 2023-07-24 DIAGNOSIS — G9341 Metabolic encephalopathy: Secondary | ICD-10-CM | POA: Diagnosis not present

## 2023-07-24 DIAGNOSIS — N179 Acute kidney failure, unspecified: Secondary | ICD-10-CM | POA: Diagnosis not present

## 2023-07-24 DIAGNOSIS — E876 Hypokalemia: Secondary | ICD-10-CM | POA: Diagnosis not present

## 2023-07-24 LAB — GLUCOSE, CAPILLARY
Glucose-Capillary: 96 mg/dL (ref 70–99)
Glucose-Capillary: 110 mg/dL — ABNORMAL HIGH (ref 70–99)
Glucose-Capillary: 108 mg/dL — ABNORMAL HIGH (ref 70–99)
Glucose-Capillary: 113 mg/dL — ABNORMAL HIGH (ref 70–99)

## 2023-07-24 LAB — URINALYSIS, MICROSCOPIC (REFLEX)

## 2023-07-24 MED ORDER — SODIUM CHLORIDE 0.9 % IV SOLN
1.0000 g | INTRAVENOUS | Status: DC
  Administered 2023-07-24 – 2023-07-25 (×2): 1 g via INTRAVENOUS
  Filled 2023-07-24 (×2): qty 10

## 2023-07-24 MED ORDER — POTASSIUM CHLORIDE CRYS ER 20 MEQ PO TBCR
40.0000 meq | EXTENDED_RELEASE_TABLET | Freq: Three times a day (TID) | ORAL | Status: AC
  Administered 2023-07-24: 40 meq via ORAL
  Filled 2023-07-24: qty 2

## 2023-07-24 MED ORDER — POTASSIUM CHLORIDE IN NACL 40-0.9 MEQ/L-% IV SOLN: INTRAVENOUS | Status: AC

## 2023-07-24 NOTE — NC FL2 (Deleted)
[place] MEDICAID FL2 LEVEL OF CARE FORM     IDENTIFICATION  Patient Name: Deanna Henderson Birthdate: 09-16-1951 Sex: female Admission Date (Current Location): 07/23/2023  [place] and [place] Number:  Aaron Edelman [id number] Facility and Address:  [facility],  [address], [place] [address]      Provider Number: [id number]  Attending Physician Name and Address:  Catarina Hartshorn, MD  Relative Name and Phone Number:  Gelene Mink (Sister)  [phone]    Current Level of Care: Hospital Recommended Level of Care: Skilled Nursing Facility Prior Approval Number:    Date Approved/Denied:   PASRR Number:    Discharge Plan: SNF    Current Diagnoses: Patient Active Problem List   Diagnosis Date Noted   Hypokalemia 07/23/2023   AKI (acute kidney injury) (HCC) 07/23/2023   Anemia 07/23/2023   Fall 05/29/2023   HLD (hyperlipidemia) 05/29/2023   Hypothyroidism 05/29/2023   UTI (urinary tract infection) 05/29/2023   Generalized weakness 05/29/2023   Dehydration 05/29/2023   Dementia (HCC) 05/29/2023   Acute metabolic encephalopathy 05/29/2023   Osteoporosis 11/29/2021   Post-menopausal 09/20/2015   Axillary mass 01/19/2015   Obesity 01/22/2012   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    Orientation RESPIRATION BLADDER Height & Weight     Self, Time, Situation, Place  Normal Incontinent Weight: 213 lb 3 oz (96.7 kg) Height:  5\' 5"  (165.1 cm)  BEHAVIORAL SYMPTOMS/MOOD NEUROLOGICAL BOWEL NUTRITION STATUS      Incontinent Diet (heart healthy)  AMBULATORY STATUS COMMUNICATION OF NEEDS Skin   Extensive Assist Verbally Other (Comment) (skin tear, perineum, right, left)                       Personal Care Assistance Level of Assistance  Bathing, Feeding, Dressing Bathing Assistance: Limited assistance Feeding assistance: Independent Dressing Assistance: Limited assistance     Functional  Limitations Info  Sight, Hearing, Speech Sight Info: Adequate Hearing Info: Adequate Speech Info: Adequate    SPECIAL CARE FACTORS FREQUENCY                       Contractures Contractures Info: Not present    Additional Factors Info  Code Status, Allergies Code Status Info: Full Allergies Info: Codeine           Current Medications (07/24/2023):  This is the current hospital active medication list Current Facility-Administered Medications  Medication Dose Route Frequency Provider Last Rate Last Admin   0.9 % NaCl with KCl 40 mEq / L  infusion   Intravenous Continuous Tat, David, MD 75 mL/hr at 07/24/23 1125 New Bag at 07/24/23 1125   acetaminophen (TYLENOL) tablet 650 mg  650 mg Oral Q6H PRN Emokpae, Ejiroghene E, MD       Or   acetaminophen (TYLENOL) suppository 650 mg  650 mg Rectal Q6H PRN Emokpae, Ejiroghene E, MD       cefTRIAXone (ROCEPHIN) 1 g in sodium chloride 0.9 % 100 mL IVPB  1 g Intravenous Q24H Tat, David, MD 200 mL/hr at 07/24/23 1131 1 g at 07/24/23 1131   Chlorhexidine Gluconate Cloth 2 % PADS 6 each  6 each Topical Q0600 Emokpae, Ejiroghene E, MD   6 each at 07/24/23 1000   donepezil (ARICEPT) tablet 10 mg  10 mg Oral Daily Emokpae, Ejiroghene E, MD   10 mg at 07/24/23 1029   heparin injection 5,000 Units  5,000  Units Subcutaneous Q8H Emokpae, Ejiroghene E, MD   5,000 Units at 07/24/23 0608   levothyroxine (SYNTHROID) tablet 75 mcg  75 mcg Oral QAC breakfast Emokpae, Ejiroghene E, MD   75 mcg at 07/24/23 0608   ondansetron (ZOFRAN) tablet 4 mg  4 mg Oral Q6H PRN Emokpae, Ejiroghene E, MD       Or   ondansetron (ZOFRAN) injection 4 mg  4 mg Intravenous Q6H PRN Emokpae, Ejiroghene E, MD       polyethylene glycol (MIRALAX / GLYCOLAX) packet 17 g  17 g Oral Daily PRN Emokpae, Ejiroghene E, MD       potassium chloride SA (KLOR-CON M) CR tablet 40 mEq  40 mEq Oral TID Briscoe Deutscher, MD   40 mEq at 07/24/23 1029     Discharge Medications: Please see  discharge summary for a list of discharge medications.  Relevant Imaging Results:  Relevant Lab Results:   Additional Information SSN [id number].  , Juleen China, LCSW

## 2023-07-24 NOTE — [place] FL2 (Signed)
[place] MEDICA[place] FL2 LEVEL OF CARE FORM     IDENTIFICATION  Patient Name: Deanna Henderson Birthdate: 11-05-1951 Sex: female Admission Date (Current Location): 07/23/2023  [place] and [place] Number:  Aaron Edelman [id number] T Facility and Address:  [facility],  [address] [place] [address]      Provider Number: [id number]  Attending Physician Name and Address:  Catarina Hartshorn, MD  Relative Name and Phone Number:  Gelene Mink (Sister)  [phone]    Current Level of Care: Hospital Recommended Level of Care: Skilled Nursing Facility Prior Approval Number:    Date Approved/Denied:   PASRR Number:    Discharge Plan: SNF    Current Diagnoses: Patient Active Problem List   Diagnosis Date Noted   Hypokalemia 07/23/2023   AKI (acute kidney injury) (HCC) 07/23/2023   Anemia 07/23/2023   Fall 05/29/2023   HLD (hyperlipidemia) 05/29/2023   Hypothyroidism 05/29/2023   UTI (urinary tract infection) 05/29/2023   Generalized weakness 05/29/2023   Dehydration 05/29/2023   Dementia (HCC) 05/29/2023   Acute metabolic encephalopathy 05/29/2023   Osteoporosis 11/29/2021   Post-menopausal 09/20/2015   Axillary mass 01/19/2015   Obesity 01/22/2012   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    Orientation RESPIRATION BLADDER Height & Weight     Self, Time, Situation, Place  Normal Incontinent Weight: 213 lb 3 oz (96.7 kg) Height:  5\' 5"  (165.1 cm)  BEHAVIORAL SYMPTOMS/MOOD NEUROLOGICAL BOWEL NUTRITION STATUS      Incontinent Diet (heart healthy)  AMBULATORY STATUS COMMUNICATION OF NEEDS Skin    (uses wc) Verbally Other (Comment) (skin tear, perineum, right, left)                       Personal Care Assistance Level of Assistance  Bathing, Feeding, Dressing Bathing Assistance: Limited assistance Feeding assistance: Independent Dressing Assistance: Limited assistance     Functional  Limitations Info  Sight, Hearing, Speech Sight Info: Adequate Hearing Info: Adequate Speech Info: Adequate    SPECIAL CARE FACTORS FREQUENCY  PT (By licensed PT)     PT Frequency: 5x/week              Contractures Contractures Info: Not present    Additional Factors Info  Code Status, Allergies Code Status Info: Full Allergies Info: Codeine           Current Medications (07/24/2023):  This is the current hospital active medication list Current Facility-Administered Medications  Medication Dose Route Frequency Provider Last Rate Last Admin   0.9 % NaCl with KCl 40 mEq / L  infusion   Intravenous Continuous Tat, David, MD 75 mL/hr at 07/24/23 1125 New Bag at 07/24/23 1125   acetaminophen (TYLENOL) tablet 650 mg  650 mg Oral Q6H PRN Emokpae, Ejiroghene E, MD       Or   acetaminophen (TYLENOL) suppository 650 mg  650 mg Rectal Q6H PRN Emokpae, Ejiroghene E, MD       cefTRIAXone (ROCEPHIN) 1 g in sodium chloride 0.9 % 100 mL IVPB  1 g Intravenous Q24H Tat, David, MD 200 mL/hr at 07/24/23 1131 1 g at 07/24/23 1131   Chlorhexidine Gluconate Cloth 2 % PADS 6 each  6 each Topical Q0600 Emokpae, Ejiroghene E, MD   6 each at 07/24/23 1000   donepezil (ARICEPT) tablet 10 mg  10 mg Oral Daily Emokpae, Ejiroghene E, MD   10 mg at 07/24/23 1029   heparin injection  5,000 Units  5,000 Units Subcutaneous Q8H Emokpae, Ejiroghene E, MD   5,000 Units at 07/24/23 0608   levothyroxine (SYNTHROID) tablet 75 mcg  75 mcg Oral QAC breakfast Emokpae, Ejiroghene E, MD   75 mcg at 07/24/23 0608   ondansetron (ZOFRAN) tablet 4 mg  4 mg Oral Q6H PRN Emokpae, Ejiroghene E, MD       Or   ondansetron (ZOFRAN) injection 4 mg  4 mg Intravenous Q6H PRN Emokpae, Ejiroghene E, MD       polyethylene glycol (MIRALAX / GLYCOLAX) packet 17 g  17 g Oral Daily PRN Emokpae, Ejiroghene E, MD       potassium chloride SA (KLOR-CON M) CR tablet 40 mEq  40 mEq Oral TID Briscoe Deutscher, MD   40 mEq at 07/24/23 1029      Discharge Medications: Please see discharge summary for a list of discharge medications.  Relevant Imaging Results:  Relevant Lab Results:   Additional Information SSN [id number].  , Juleen China, LCSW

## 2023-07-24 NOTE — Progress Notes (Signed)
Bladder scan volume 2,022 ml. Performed I&O per verbal order, total output 2,475 ml cloudy, dark yellow urine. Dr. Arbutus Leas aware.

## 2023-07-24 NOTE — TOC Initial Note (Addendum)
Transition of Care [facility]) - Initial/Assessment Note    Patient Details  Name: Deanna Henderson MRN: [id number] Date of Birth: 03/19/1951  Transition of Care [facility]) CM/SW Contact:    Annice Needy, LCSW Phone Number: 07/24/2023, 12:29 PM  Clinical Narrative:                 Patient admitted from [facility] in June. Admitted for hypokalemia. LTC, receiving therapy under her Medicare. Uses a wc. Moderate assistance for dressing/grooming.   Expected Discharge Plan: Skilled Nursing Facility Barriers to Discharge: Continued Medical Work up   Patient Goals and CMS Choice Patient states their goals for this hospitalization and ongoing recovery are:: return to facility          Expected Discharge Plan and Services                                              Prior Living Arrangements/Services                       Activities of Daily Living   ADL Screening (condition at time of admission) Patient's cognitive ability adequate to safely complete daily activities?: No Independently performs ADLs?: No  Permission Sought/Granted                  Emotional Assessment         Alcohol / Substance Use: Not Applicable Psych Involvement: No (comment)  Admission diagnosis:  Hypokalemia [E87.6] Generalized weakness [R53.1] AKI (acute kidney injury) (HCC) [N17.9] Patient Active Problem List   Diagnosis Date Noted   Hypokalemia 07/23/2023   AKI (acute kidney injury) (HCC) 07/23/2023   Anemia 07/23/2023   Fall 05/29/2023   HLD (hyperlipidemia) 05/29/2023   Hypothyroidism 05/29/2023   UTI (urinary tract infection) 05/29/2023   Generalized weakness 05/29/2023   Dehydration 05/29/2023   Dementia (HCC) 05/29/2023   Acute metabolic encephalopathy 05/29/2023   Osteoporosis 11/29/2021   Post-menopausal 09/20/2015   Axillary mass 01/19/2015   Obesity 01/22/2012   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   Invasive ductal carcinoma of breast  (HCC) 07/22/2011   PCP:  Pearson Grippe, MD Pharmacy:   [id number] - [place], [place] - [address] [place] [address] Phone: [phone] Fax: [phone]  [id number] - [place], [place] - [address] AT [facility] [address] [place] [address] Phone: [phone] Fax: [phone]     Social Determinants of Health (SDOH) Social History: SDOH Screenings   Food Insecurity: No Food Insecurity (05/29/2023)  Housing: Medium Risk (05/29/2023)  Transportation Needs: No Transportation Needs (05/29/2023)  Utilities: Not At Risk (05/29/2023)  Alcohol Screen: Low Risk  (11/16/2020)  Depression (PHQ2-9): Low Risk  (11/16/2020)  Financial Resource Strain: Low Risk  (11/16/2020)  Physical Activity: Insufficiently Active (11/16/2020)  Social Connections: Moderately Integrated (11/16/2020)  Stress: No Stress Concern Present (11/16/2020)  Tobacco Use: Low Risk  (07/23/2023)   SDOH Interventions:     Readmission Risk Interventions     No data to display

## 2023-07-24 NOTE — Hospital Course (Signed)
72 year old female with a history of dementia, obesity, frequent UTIs, breast cancer, hypothyroidism presenting from [facility] secondary to altered mental status and generalized weakness.  Apparently, the patient was working with physical therapy, and there was some concern for some subtle left-sided facial weakness and upper extremity weakness.  As result, code stroke was activated.  Initial imaging with a CT brain and MRI brain were negative.  The patient was evaluated by teleneurology.  TNK was deferred secondary to nonfocal exam and patient was felt to be a poor candidate by a modified Rankin scale.. Dr. Wilford Corner felt that her presentation was more consistent with metabolic encephalopathy. Further history revealed that the patient has had increasing generalized weakness for at least the last 2 days prior to admission.  In addition, the patient was more encephalopathic than her usual baseline. In the ED, the patient was somnolent and not able to provide any history. She was afebrile and hemodynamically stable albeit with soft blood pressures.  Oxygen saturation was 96-98% on room air.  WBC 10.0, hemoglobin 8.7, platelets 242,000.  Sodium 131, potassium <2.0, bicarbonate 29, serum creatinine 3.28.  AST 57, ALT 42, alk phosphatase 107, total bilirubin 0.4.  Corrected calcium 8.6.  UA showed 21-50 WBC.  MRI brain was negative for acute findings.  CT brain was negative for any acute findings.  TSH 29.728.  UDS was negative.  The patient was started on IV fluids and admitted for further evaluation and treatment.

## 2023-07-24 NOTE — Progress Notes (Signed)
Patient continues to have urinary retention, bladder scan > 707. Order to place Foley catheter, per Dr. Arbutus Leas.

## 2023-07-24 NOTE — Progress Notes (Signed)
PROGRESS NOTE  Deanna Henderson NGE:[id number] DOB: 05/14/1951 DOA: 07/23/2023 PCP: Pearson Grippe, MD  Brief History:  72 year old female with a history of dementia, obesity, frequent UTIs, breast cancer, hypothyroidism presenting from [facility] secondary to altered mental status and generalized weakness.  Apparently, the patient was working with physical therapy, and there was some concern for some subtle left-sided facial weakness and upper extremity weakness.  As result, code stroke was activated.  Initial imaging with a CT brain and MRI brain were negative.  The patient was evaluated by teleneurology.  TNK was deferred secondary to nonfocal exam and patient was felt to be a poor candidate by a modified Rankin scale.. Dr. Wilford Corner felt that her presentation was more consistent with metabolic encephalopathy. Further history revealed that the patient has had increasing generalized weakness for at least the last 2 days prior to admission.  In addition, the patient was more encephalopathic than her usual baseline. In the ED, the patient was somnolent and not able to provide any history. She was afebrile and hemodynamically stable albeit with soft blood pressures.  Oxygen saturation was 96-98% on room air.  WBC 10.0, hemoglobin 8.7, platelets 242,000.  Sodium 131, potassium <2.0, bicarbonate 29, serum creatinine 3.28.  AST 57, ALT 42, alk phosphatase 107, total bilirubin 0.4.  Corrected calcium 8.6.  UA showed 21-50 WBC.  MRI brain was negative for acute findings.  CT brain was negative for any acute findings.  TSH 29.728.  UDS was negative.  The patient was started on IV fluids and admitted for further evaluation and treatment.   Assessment/Plan: Acute metabolic encephalopathy -The patient remains slow to respond and encephalopathic -Multifactorial including AKI, UTI, and hypothyroidism -MR brain neg for acute findings -UA 21-50 WBC -Obtain urine culture -Continue IV fluids -Ammonia  <10 -B12--311 -Folic acid 7.3  Acute kidney injury -Baseline creatinine 0.5-0.8 -Presented with serum creatinine 3.28 -Secondary to volume depletion -Hold hydrochlorothiazide -Continue IV fluids -renal US  Pyuria -concerned about UTI -Obtain urine culture  Hypokalemia -Repleting -Magnesium 1.7  Generalized weakness Likely secondary to severe hypokalemia of less than 2. - Follow up UA   Hypothyroidism TSH elevated at 29.7 today, was WNL-just a month ago at 0.5.  Elevated TSH in the setting of acute illness. Doubt this level of TSH would contribute to encephalopathy.   -free T4 -Resume Synthroid at home dose 75 mcg daily for now   [facility]) Nursing home resident.  Ambulates with walker + assistance also.   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  She was also on letrozole therapy.          Family Communication:  no Family at bedside  Consultants:  none  Code Status:  FULL   DVT Prophylaxis:  [place] Heparin   Procedures: As Listed in Progress Note Above  Antibiotics: None       Subjective:  Review of systems is limited secondary to acute metabolic encephalopathy.  The patient denies any chest pain, shortness breath, abdominal pain.  Remainder review of systems unobtainable. Objective: Vitals:   07/24/23 0500 07/24/23 0600 07/24/23 0700 07/24/23 0753  BP: (!) 104/36 (!) 111/53 (!) 115/39   Pulse: 66 66 67   Resp: 18 18 19    Temp: 98 F (36.7 C)   98.4 F (36.9 C)  TempSrc: Oral   Oral  SpO2: 96% 98% 97%   Weight: 96.7 kg  Height:        Intake/Output Summary (Last 24 hours) at 07/24/2023 0919 Last data filed at 07/24/2023 0100 Gross per 24 hour  Intake 50 ml  Output 400 ml  Net -350 ml   Weight change:  Exam:  General:  Pt is alert, follows commands appropriately, not in acute distress HEENT: No icterus, No thrush, No neck mass, [place]/AT Cardiovascular: RRR, S1/S2, no rubs, no  gallops Respiratory: CTA bilaterally, no wheezing, no crackles, no rhonchi Abdomen: Soft/+BS, non tender, non distended, no guarding Extremities: Non pitting edema, No lymphangitis, No petechiae, No rashes, no synovitis   Data Reviewed: I have personally reviewed following labs and imaging studies Basic Metabolic Panel: Recent Labs  Lab 07/23/23 1537 07/23/23 1904 07/24/23 0518  NA 131*  --  134*  K <2.0*  --  2.6*  CL 85*  --  91*  CO2 29  --  27  GLUCOSE 160*  --  112*  BUN 102*  --  97*  CREATININE 3.28*  --  2.80*  CALCIUM 7.0*  --  7.5*  MG  --  1.7  --    Liver Function Tests: Recent Labs  Lab 07/23/23 1537  AST 57*  ALT 42  ALKPHOS 107  BILITOT 0.4  PROT 6.0*  ALBUMIN 2.0*   No results for input(s): "LIPASE", "AMYLASE" in the last 168 hours. Recent Labs  Lab 07/23/23 1613  AMMONIA <10   Coagulation Profile: Recent Labs  Lab 07/23/23 1537  INR 1.1   CBC: Recent Labs  Lab 07/23/23 1537 07/24/23 0518  WBC 18.0* 17.2*  NEUTROABS 16.2*  --   HGB 8.7* 7.8*  HCT 26.9* 23.9*  MCV 92.8 90.5  PLT 242 357   Cardiac Enzymes: No results for input(s): "CKTOTAL", "CKMB", "CKMBINDEX", "TROPONINI" in the last 168 hours. BNP: Invalid input(s): "POCBNP" CBG: Recent Labs  Lab 07/23/23 1410 07/23/23 2331 07/24/23 0736  GLUCAP 182* 115* 96   HbA1C: No results for input(s): "HGBA1C" in the last 72 hours. Urine analysis:    Component Value Date/Time   COLORURINE YELLOW 07/24/2023 0318   APPEARANCEUR CLOUDY (A) 07/24/2023 0318   LABSPEC 1.010 07/24/2023 0318   PHURINE 7.0 07/24/2023 0318   GLUCOSEU NEGATIVE 07/24/2023 0318   HGBUR LARGE (A) 07/24/2023 0318   BILIRUBINUR NEGATIVE 07/24/2023 0318   KETONESUR NEGATIVE 07/24/2023 0318   PROTEINUR 30 (A) 07/24/2023 0318   NITRITE NEGATIVE 07/24/2023 0318   LEUKOCYTESUR MODERATE (A) 07/24/2023 0318   Sepsis Labs: @LABRCNTIP (procalcitonin:4,lacticidven:4) ) Recent Results (from the past 240 hour(s))   MRSA Next Gen by PCR, Nasal     Status: None   Collection Time: 07/23/23  9:08 PM   Specimen: Nasal Mucosa; Nasal Swab  Result Value Ref Range Status   MRSA by PCR Next Gen NOT DETECTED NOT DETECTED Final    Comment: (NOTE) The GeneXpert MRSA Assay (FDA approved for NASAL specimens only), is one component of a comprehensive MRSA colonization surveillance program. It is not intended to diagnose MRSA infection nor to guide or monitor treatment for MRSA infections. Test performance is not FDA approved in patients less than 36 years old. Performed at [facility], [address] [place], [place] [address]      Scheduled Meds:  Chlorhexidine Gluconate Cloth  6 each Topical Q0600   donepezil  10 mg Oral Daily   heparin  5,000 Units Subcutaneous Q8H   levothyroxine  75 mcg Oral QAC breakfast   potassium chloride  40 mEq Oral TID  Continuous Infusions:  0.9 % NaCl with KCl 40 mEq / L 75 mL/hr at 07/23/23 2353    Procedures/Studies: DG Chest Port 1 View  Result Date: 07/23/2023 CLINICAL DATA:  Altered mental status. EXAM: PORTABLE CHEST 1 VIEW COMPARISON:  08/22/2010. FINDINGS: Elevated right hemidiaphragm. Clear lungs. Normal heart size. No mediastinal widening. No pleural effusion or pneumothorax. IMPRESSION: No evidence of acute cardiopulmonary disease. Elevated right hemidiaphragm. Electronically Signed   By: Orvan Falconer M.D.   On: 07/23/2023 17:11   MR BRAIN WO CONTRAST  Result Date: 07/23/2023 CLINICAL DATA:  Neuro deficit, acute, stroke suspected. Left-sided facial droop and weakness. EXAM: MRI HEAD WITHOUT CONTRAST TECHNIQUE: Multiplanar, multiecho pulse sequences of the brain and surrounding structures were obtained without intravenous contrast. COMPARISON:  Head CT earlier same day. FINDINGS: Brain: Diffusion imaging does not show any acute or subacute infarction. Mild chronic small-vessel ischemic change affects the pons. No focal cerebellar insult. Cerebral hemispheres show  moderate chronic small-vessel ischemic changes of the white matter and the right basal ganglia. No large vessel territory stroke. No mass, hemorrhage, hydrocephalus or extra-axial collection. Vascular: Major vessels at the base of the brain show flow. Skull and upper cervical spine: Negative Sinuses/Orbits: Clear/normal Other: Tiny amount of mastoid fluid on the right, probably not significant. IMPRESSION: No acute finding. Moderate chronic small-vessel ischemic changes of the pons and cerebral hemispheric white matter. Electronically Signed   By: Paulina Fusi M.D.   On: 07/23/2023 15:08   CT HEAD CODE STROKE WO CONTRAST  Result Date: 07/23/2023 CLINICAL DATA:  Code stroke. Neuro deficit, acute, stroke suspected. Left-sided facial droop and weakness. EXAM: CT HEAD WITHOUT CONTRAST TECHNIQUE: Contiguous axial images were obtained from the base of the skull through the vertex without intravenous contrast. RADIATION DOSE REDUCTION: This exam was performed according to the departmental dose-optimization program which includes automated exposure control, adjustment of the mA and/or kV according to patient size and/or use of iterative reconstruction technique. COMPARISON:  Head CT 05/28/2023. FINDINGS: Brain: No acute hemorrhage. Unchanged moderate chronic small-vessel disease. Cortical gray-white differentiation is otherwise preserved. Prominence of the ventricles and sulci within expected range for age. No hydrocephalus or extra-axial collection. No mass effect or midline shift. Vascular: No hyperdense vessel or unexpected calcification. Skull: No calvarial fracture or suspicious bone lesion. Skull base is unremarkable. Sinuses/Orbits: No acute finding. Other: None. ASPECTS [facility] Stroke Program Early CT Score) - Ganglionic level infarction (caudate, lentiform nuclei, internal capsule, insula, M1-M3 cortex): 7 - Supraganglionic infarction (M4-M6 cortex): 3 Total score (0-10 with 10 being normal): 10 IMPRESSION: 1.  No acute intracranial hemorrhage or evidence of acute large vessel territory infarct. ASPECT score is 10. 2. Stable moderate chronic small-vessel disease. Code stroke imaging results were communicated on 07/23/2023 at 2:30 pm to provider Dr. Audrie Lia via telephone, who verbally acknowledged these results. Electronically Signed   By: Orvan Falconer M.D.   On: 07/23/2023 14:30    Catarina Hartshorn, DO  [facility]  If 7PM-7AM, please contact night-coverage [email or web address] Password [email or web address] 07/24/2023, 9:19 AM   LOS: 1 day

## 2023-07-24 NOTE — Plan of Care (Signed)
  Problem: Education: Goal: Knowledge of General Education information will improve Description: Including pain rating scale, medication(s)/side effects and non-pharmacologic comfort measures Outcome: Progressing   Problem: Health Behavior/Discharge Planning: Goal: Ability to manage health-related needs will improve Outcome: Progressing   Problem: Clinical Measurements: Goal: Ability to maintain clinical measurements within normal limits will improve Outcome: Progressing Goal: Will remain free from infection Outcome: Progressing Goal: Diagnostic test results will improve Outcome: Progressing Goal: Respiratory complications will improve Outcome: Progressing Goal: Cardiovascular complication will be avoided Outcome: Progressing   Problem: Activity: Goal: Risk for activity intolerance will decrease Outcome: Progressing   Problem: Activity: Goal: Risk for activity intolerance will decrease Outcome: Progressing   Problem: Nutrition: Goal: Adequate nutrition will be maintained Outcome: Progressing   Problem: Coping: Goal: Level of anxiety will decrease Outcome: Progressing   Problem: Elimination: Goal: Will not experience complications related to bowel motility Outcome: Progressing Goal: Will not experience complications related to urinary retention Outcome: Progressing

## 2023-07-24 NOTE — Progress Notes (Signed)
CRITICAL VALUE STICKER  CRITICAL VALUE: Potassium 2.6  MD NOTIFIED: Dr. Karie Schwalbe Opyd  TIME OF NOTIFICATION: 06:44  RESPONSE:  awaiting

## 2023-07-25 DIAGNOSIS — N179 Acute kidney failure, unspecified: Secondary | ICD-10-CM | POA: Diagnosis not present

## 2023-07-25 DIAGNOSIS — E876 Hypokalemia: Secondary | ICD-10-CM | POA: Diagnosis not present

## 2023-07-25 DIAGNOSIS — G9341 Metabolic encephalopathy: Secondary | ICD-10-CM | POA: Diagnosis not present

## 2023-07-25 LAB — GLUCOSE, CAPILLARY
Glucose-Capillary: 116 mg/dL — ABNORMAL HIGH (ref 70–99)
Glucose-Capillary: 190 mg/dL — ABNORMAL HIGH (ref 70–99)
Glucose-Capillary: 167 mg/dL — ABNORMAL HIGH (ref 70–99)

## 2023-07-25 MED ORDER — LACTATED RINGERS IV BOLUS
1000.0000 mL | Freq: Once | INTRAVENOUS | Status: AC
  Administered 2023-07-25: 1000 mL via INTRAVENOUS

## 2023-07-25 MED ORDER — POTASSIUM CHLORIDE CRYS ER 20 MEQ PO TBCR
40.0000 meq | EXTENDED_RELEASE_TABLET | Freq: Four times a day (QID) | ORAL | Status: AC
  Administered 2023-07-25 (×2): 40 meq via ORAL
  Filled 2023-07-25 (×2): qty 2

## 2023-07-25 MED ORDER — LEVOTHYROXINE SODIUM 100 MCG PO TABS
100.0000 ug | ORAL_TABLET | Freq: Every day | ORAL | Status: DC
  Administered 2023-07-26 – 2023-07-28 (×3): 100 ug via ORAL
  Filled 2023-07-25 (×3): qty 1

## 2023-07-25 MED ORDER — SODIUM CHLORIDE 0.9 % IV SOLN
1.0000 g | Freq: Two times a day (BID) | INTRAVENOUS | Status: DC
  Administered 2023-07-25 – 2023-07-27 (×4): 1 g via INTRAVENOUS
  Filled 2023-07-25 (×4): qty 20

## 2023-07-25 MED ORDER — POTASSIUM CHLORIDE CRYS ER 20 MEQ PO TBCR
40.0000 meq | EXTENDED_RELEASE_TABLET | Freq: Once | ORAL | Status: AC
  Administered 2023-07-25: 40 meq via ORAL
  Filled 2023-07-25: qty 2

## 2023-07-25 MED ORDER — POTASSIUM CHLORIDE IN NACL 40-0.9 MEQ/L-% IV SOLN: INTRAVENOUS | Status: AC

## 2023-07-25 NOTE — Progress Notes (Signed)
Pt's automatic BP cuff was reading low so did a manual on the right arm. BP is 112/44. No changes in status. Pt says she does to feel any different.

## 2023-07-25 NOTE — Care Management Important Message (Signed)
Important Message  Patient Details  Name: Deanna Henderson MRN: [id number] Date of Birth: [birth year]   Medicare Important Message Given:  Yes     [name] 07/25/2023, 2:29 PM

## 2023-07-25 NOTE — Progress Notes (Signed)
Date and time results received: 07/25/23 @ 5:45am  Test: Potassium Critical Value: 2.7  Name of Provider Notified: Dr. Antionette Char @ 5:50am  Orders Received: See new orders @ 5:55am

## 2023-07-25 NOTE — Progress Notes (Signed)
Pharmacy Antibiotic Note  Deanna Henderson is a 72 y.o. female admitted on 07/23/2023 with UTI ESBL infection.   Pharmacy has been consulted for Merrem dosing.  Plan: Merrem 1gm IV q12h F/u Cx and clinical progress Deescalate as indicated  Height: 5\' 5"  (165.1 cm) Weight: 89.4 kg (197 lb 1.5 oz) IBW/kg (Calculated) : 57  Temp (24hrs), Avg:99.1 F (37.3 C), Min:97.8 F (36.6 C), Max:100.8 F (38.2 C)  Recent Labs  Lab 07/23/23 1537 07/24/23 0518 07/25/23 0500  WBC 18.0* 17.2* 19.5*  CREATININE 3.28* 2.80* 2.07*    Estimated Creatinine Clearance: 26.3 mL/min (A) (by C-G formula based on SCr of 2.07 mg/dL (H)).    Allergies  Allergen Reactions   Codeine Nausea Only    Antimicrobials this admission: Merrem  8/9 >>  ceftriaxone 8/8 >> 8/9   Microbiology results: 8/8 urine cx 90 K CFU/ml K. Pneumo 8/7 MRSA PCR: negative  Thank you for allowing pharmacy to be a part of this patient's care.  Tera Mater 07/25/2023 5:13 PM

## 2023-07-25 NOTE — Plan of Care (Signed)
?  Problem: Education: ?Goal: Knowledge of General Education information will improve ?Description: Including pain rating scale, medication(s)/side effects and non-pharmacologic comfort measures ?Outcome: Progressing ?  ?Problem: Health Behavior/Discharge Planning: ?Goal: Ability to manage health-related needs will improve ?Outcome: Progressing ?  ?Problem: Clinical Measurements: ?Goal: Ability to maintain clinical measurements within normal limits will improve ?Outcome: Progressing ?Goal: Will remain free from infection ?Outcome: Progressing ?Goal: Diagnostic test results will improve ?Outcome: Progressing ?Goal: Respiratory complications will improve ?Outcome: Progressing ?Goal: Cardiovascular complication will be avoided ?Outcome: Progressing ?  ?Problem: Nutrition: ?Goal: Adequate nutrition will be maintained ?Outcome: Progressing ?  ?Problem: Coping: ?Goal: Level of anxiety will decrease ?Outcome: Progressing ?  ?Problem: Elimination: ?Goal: Will not experience complications related to urinary retention ?Outcome: Progressing ?  ?Problem: Pain Managment: ?Goal: General experience of comfort will improve ?Outcome: Progressing ?  ?Problem: Safety: ?Goal: Ability to remain free from injury will improve ?Outcome: Progressing ?  ?Problem: Activity: ?Goal: Risk for activity intolerance will decrease ?Outcome: Not Progressing ?  ?Problem: Elimination: ?Goal: Will not experience complications related to bowel motility ?Outcome: Not Progressing ?  ?Problem: Skin Integrity: ?Goal: Risk for impaired skin integrity will decrease ?Outcome: Not Progressing ?  ?

## 2023-07-25 NOTE — Progress Notes (Signed)
PROGRESS NOTE  Deanna Henderson EXB:[id number] DOB: [birth year] DOA: 07/23/2023 PCP: Pearson Grippe, MD  Brief History:  72 year old female with a history of dementia, obesity, frequent UTIs, breast cancer, hypothyroidism presenting from [facility] secondary to altered mental status and generalized weakness.  Apparently, the patient was working with physical therapy, and there was some concern for some subtle left-sided facial weakness and upper extremity weakness.  As result, code stroke was activated.  Initial imaging with a CT brain and MRI brain were negative.  The patient was evaluated by teleneurology.  TNK was deferred secondary to nonfocal exam and patient was felt to be a poor candidate by a modified Rankin scale.. Dr. Wilford Corner felt that her presentation was more consistent with metabolic encephalopathy. Further history revealed that the patient has had increasing generalized weakness for at least the last 2 days prior to admission.  In addition, the patient was more encephalopathic than her usual baseline. In the ED, the patient was somnolent and not able to provide any history. She was afebrile and hemodynamically stable albeit with soft blood pressures.  Oxygen saturation was 96-98% on room air.  WBC 10.0, hemoglobin 8.7, platelets 242,000.  Sodium 131, potassium <2.0, bicarbonate 29, serum creatinine 3.28.  AST 57, ALT 42, alk phosphatase 107, total bilirubin 0.4.  Corrected calcium 8.6.  UA showed 21-50 WBC.  MRI brain was negative for acute findings.  CT brain was negative for any acute findings.  TSH 29.728.  UDS was negative.  The patient was started on IV fluids and admitted for further evaluation and treatment.   Assessment/Plan: Acute metabolic encephalopathy -The patient remains slow to respond and encephalopathic -Multifactorial including AKI, UTI, and hypothyroidism -MR brain neg for acute findings -UA 21-50 WBC -Obtain urine culture--Kleb pneumoniae -Continue IV  fluids -Ammonia <10 -B12--311 -Folic acid 7.3 -8/9--more alert, but not back to baseline   Acute kidney injury -Baseline creatinine 0.5-0.8 -Presented with serum creatinine 3.28 -Secondary to volume depletion -Hold hydrochlorothiazide -Continue IV fluids -renal US--right hydronephrosis (probably due to urinary retention, distended urinary bladder)>>foley placed 8/8 evening   Klebsiella pneumoniae UTI -d/c ceftriaxone -start merrem pending culture data  Urinary retention -this is contributing to the R-hydronephrosis -plan repeat renal US in next 24 hours to re-eval hydronephrosis now that foley cath was placed on 8/8 evening  Hypokalemia -Repleting -Magnesium 1.7   Generalized weakness secondary to severe hypokalemia of less than 2, UTI   Hypothyroidism TSH elevated at 29.7 today, was WNL-just a month ago at 0.5.  Elevated TSH in the setting of acute illness. Doubt this level of TSH would contribute to encephalopathy.   -free T4 low at 0.54 -increase synthroid to 100 mcg   Dementia (HCC) Nursing home resident.  Ambulates with walker + assistance also.   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  She was also on letrozole therapy.                   Family Communication:  no Family at bedside   Consultants:  none   Code Status:  FULL    DVT Prophylaxis:  [place] Heparin     Procedures: As Listed in Progress Note Above   Antibiotics: Ceftriaxone 8/8>>8/9 Merrem 8/9>>         Subjective: Pt denies f/c, cp, sob, n/v/d, abd pain  Objective: Vitals:   07/25/23 1138 07/25/23 1200 07/25/23 1230 07/25/23 1651  BP:   (!) 108/34   Pulse:  73 80   Resp:  18 15   Temp: 98.8 F (37.1 C)   99.3 F (37.4 C)  TempSrc: Axillary   Axillary  SpO2:  95% 95%   Weight:      Height:        Intake/Output Summary (Last 24 hours) at 07/25/2023 1711 Last data filed at 07/25/2023 1618 Gross per 24 hour  Intake  4482.47 ml  Output 1725 ml  Net 2757.47 ml   Weight change: -2.4 kg Exam:  General:  Pt is alert, follows commands appropriately, not in acute distress HEENT: No icterus, No thrush, No neck mass, [place]/AT Cardiovascular: RRR, S1/S2, no rubs, no gallops Respiratory: CTA bilaterally, no wheezing, no crackles, no rhonchi Abdomen: Soft/+BS, non tender, non distended, no guarding Extremities: Nonpitting edema, No lymphangitis, No petechiae, No rashes, no synovitis   Data Reviewed: I have personally reviewed following labs and imaging studies Basic Metabolic Panel: Recent Labs  Lab 07/23/23 1537 07/23/23 1904 07/24/23 0518 07/25/23 0500  NA 131*  --  134* 139  K <2.0*  --  2.6* 2.7*  CL 85*  --  91* 97*  CO2 29  --  27 29  GLUCOSE 160*  --  112* 126*  BUN 102*  --  97* 88*  CREATININE 3.28*  --  2.80* 2.07*  CALCIUM 7.0*  --  7.5* 7.7*  MG  --  1.7  --  2.0   Liver Function Tests: Recent Labs  Lab 07/23/23 1537  AST 57*  ALT 42  ALKPHOS 107  BILITOT 0.4  PROT 6.0*  ALBUMIN 2.0*   No results for input(s): "LIPASE", "AMYLASE" in the last 168 hours. Recent Labs  Lab 07/23/23 1613  AMMONIA <10   Coagulation Profile: Recent Labs  Lab 07/23/23 1537  INR 1.1   CBC: Recent Labs  Lab 07/23/23 1537 07/24/23 0518 07/25/23 0500  WBC 18.0* 17.2* 19.5*  NEUTROABS 16.2*  --   --   HGB 8.7* 7.8* 8.0*  HCT 26.9* 23.9* 24.6*  MCV 92.8 90.5 91.4  PLT 242 357 379   Cardiac Enzymes: No results for input(s): "CKTOTAL", "CKMB", "CKMBINDEX", "TROPONINI" in the last 168 hours. BNP: Invalid input(s): "POCBNP" CBG: Recent Labs  Lab 07/24/23 1625 07/24/23 2059 07/25/23 0723 07/25/23 1124 07/25/23 1634  GLUCAP 108* 113* 116* 190* 167*   HbA1C: No results for input(s): "HGBA1C" in the last 72 hours. Urine analysis:    Component Value Date/Time   COLORURINE YELLOW 07/24/2023 0318   APPEARANCEUR CLOUDY (A) 07/24/2023 0318   LABSPEC 1.010 07/24/2023 0318   PHURINE  7.0 07/24/2023 0318   GLUCOSEU NEGATIVE 07/24/2023 0318   HGBUR LARGE (A) 07/24/2023 0318   BILIRUBINUR NEGATIVE 07/24/2023 0318   KETONESUR NEGATIVE 07/24/2023 0318   PROTEINUR 30 (A) 07/24/2023 0318   NITRITE NEGATIVE 07/24/2023 0318   LEUKOCYTESUR MODERATE (A) 07/24/2023 0318   Sepsis Labs: @LABRCNTIP (procalcitonin:4,lacticidven:4) ) Recent Results (from the past 240 hour(s))  MRSA Next Gen by PCR, Nasal     Status: None   Collection Time: 07/23/23  9:08 PM   Specimen: Nasal Mucosa; Nasal Swab  Result Value Ref Range Status   MRSA by PCR Next Gen NOT DETECTED NOT DETECTED Final    Comment: (NOTE) The GeneXpert MRSA Assay (FDA approved for NASAL specimens only), is one component of a comprehensive MRSA colonization surveillance program. It is not intended to diagnose MRSA infection nor to guide or monitor treatment for MRSA  infections. Test performance is not FDA approved in patients less than 11 years old. Performed at [facility], [address] [place], [place] [address]   Urine Culture (for pregnant, neutropenic or urologic patients or patients with an indwelling urinary catheter)     Status: Abnormal (Preliminary result)   Collection Time: 07/24/23  9:32 AM   Specimen: Urine, Catheterized  Result Value Ref Range Status   Specimen Description   Final    URINE, CATHETERIZED Performed at [facility], [address], [place], [place] [address]    Special Requests   Final    NONE Performed at [facility], [address] [place], [place] [address]    Culture (A)  Final    90,000 COLONIES/mL KLEBSIELLA PNEUMONIAE SUSCEPTIBILITIES TO FOLLOW Performed at [facility], [address], [place], [place] [address]    Report Status PENDING  Incomplete     Scheduled Meds:  Chlorhexidine Gluconate Cloth  6 each Topical Q0600   donepezil  10 mg Oral Daily   heparin  5,000 Units Subcutaneous Q8H   levothyroxine  75 mcg Oral QAC breakfast   Continuous  Infusions:  Procedures/Studies: US RENAL  Result Date: 07/24/2023 CLINICAL DATA:  Acute kidney injury. EXAM: RENAL / URINARY TRACT ULTRASOUND COMPLETE COMPARISON:  None Available. FINDINGS: Right Kidney: Renal measurements: 11.3 x 6.9 x 6.8 cm = volume: 276 mL. Echogenicity within normal limits. Moderate hydronephrosis is noted. 1.8 cm exophytic cyst is seen from midpole. Left Kidney: Renal measurements: 13.0 x 8.1 x 7.3 cm = volume: 401 mL. Two simple cysts are noted, the largest measuring 3.8 cm in upper pole. Echogenicity within normal limits. No mass or hydronephrosis visualized. Bladder: Calculated volume of 1981 mL.  Ureteral jets are not visualized. Other: None. IMPRESSION: Moderate right hydronephrosis.  Bilateral renal cysts. Electronically Signed   By: Lupita Raider M.D.   On: 07/24/2023 13:47   DG Chest Port 1 View  Result Date: 07/23/2023 CLINICAL DATA:  Altered mental status. EXAM: PORTABLE CHEST 1 VIEW COMPARISON:  08/22/2010. FINDINGS: Elevated right hemidiaphragm. Clear lungs. Normal heart size. No mediastinal widening. No pleural effusion or pneumothorax. IMPRESSION: No evidence of acute cardiopulmonary disease. Elevated right hemidiaphragm. Electronically Signed   By: Orvan Falconer M.D.   On: 07/23/2023 17:11   MR BRAIN WO CONTRAST  Result Date: 07/23/2023 CLINICAL DATA:  Neuro deficit, acute, stroke suspected. Left-sided facial droop and weakness. EXAM: MRI HEAD WITHOUT CONTRAST TECHNIQUE: Multiplanar, multiecho pulse sequences of the brain and surrounding structures were obtained without intravenous contrast. COMPARISON:  Head CT earlier same day. FINDINGS: Brain: Diffusion imaging does not show any acute or subacute infarction. Mild chronic small-vessel ischemic change affects the pons. No focal cerebellar insult. Cerebral hemispheres show moderate chronic small-vessel ischemic changes of the white matter and the right basal ganglia. No large vessel territory stroke. No mass,  hemorrhage, hydrocephalus or extra-axial collection. Vascular: Major vessels at the base of the brain show flow. Skull and upper cervical spine: Negative Sinuses/Orbits: Clear/normal Other: Tiny amount of mastoid fluid on the right, probably not significant. IMPRESSION: No acute finding. Moderate chronic small-vessel ischemic changes of the pons and cerebral hemispheric white matter. Electronically Signed   By: Paulina Fusi M.D.   On: 07/23/2023 15:08   CT HEAD CODE STROKE WO CONTRAST  Result Date: 07/23/2023 CLINICAL DATA:  Code stroke. Neuro deficit, acute, stroke suspected. Left-sided facial droop and weakness. EXAM: CT HEAD WITHOUT CONTRAST TECHNIQUE: Contiguous axial images were obtained from the  base of the skull through the vertex without intravenous contrast. RADIATION DOSE REDUCTION: This exam was performed according to the departmental dose-optimization program which includes automated exposure control, adjustment of the mA and/or kV according to patient size and/or use of iterative reconstruction technique. COMPARISON:  Head CT 05/28/2023. FINDINGS: Brain: No acute hemorrhage. Unchanged moderate chronic small-vessel disease. Cortical gray-white differentiation is otherwise preserved. Prominence of the ventricles and sulci within expected range for age. No hydrocephalus or extra-axial collection. No mass effect or midline shift. Vascular: No hyperdense vessel or unexpected calcification. Skull: No calvarial fracture or suspicious bone lesion. Skull base is unremarkable. Sinuses/Orbits: No acute finding. Other: None. ASPECTS [facility] Stroke Program Early CT Score) - Ganglionic level infarction (caudate, lentiform nuclei, internal capsule, insula, M1-M3 cortex): 7 - Supraganglionic infarction (M4-M6 cortex): 3 Total score (0-10 with 10 being normal): 10 IMPRESSION: 1. No acute intracranial hemorrhage or evidence of acute large vessel territory infarct. ASPECT score is 10. 2. Stable moderate chronic  small-vessel disease. Code stroke imaging results were communicated on 07/23/2023 at 2:30 pm to provider Dr. Audrie Lia via telephone, who verbally acknowledged these results. Electronically Signed   By: Orvan Falconer M.D.   On: 07/23/2023 14:30    Catarina Hartshorn, DO  [facility]  If 7PM-7AM, please contact night-coverage [email or web address] Password [email or web address] 07/25/2023, 5:11 PM   LOS: 2 days

## 2023-07-25 NOTE — Plan of Care (Signed)

## 2023-07-26 DIAGNOSIS — G9341 Metabolic encephalopathy: Secondary | ICD-10-CM | POA: Diagnosis not present

## 2023-07-26 DIAGNOSIS — E876 Hypokalemia: Secondary | ICD-10-CM | POA: Diagnosis not present

## 2023-07-26 DIAGNOSIS — N179 Acute kidney failure, unspecified: Secondary | ICD-10-CM | POA: Diagnosis not present

## 2023-07-26 MED ORDER — POTASSIUM CHLORIDE CRYS ER 20 MEQ PO TBCR
40.0000 meq | EXTENDED_RELEASE_TABLET | Freq: Once | ORAL | Status: AC
  Administered 2023-07-26: 40 meq via ORAL
  Filled 2023-07-26: qty 2

## 2023-07-26 MED ORDER — MAGNESIUM SULFATE 2 GM/50ML IV SOLN
2.0000 g | Freq: Once | INTRAVENOUS | Status: AC
  Administered 2023-07-26: 2 g via INTRAVENOUS
  Filled 2023-07-26: qty 50

## 2023-07-26 NOTE — Progress Notes (Signed)
PROGRESS NOTE  Deanna Henderson AVW:[id number] DOB: 07/30/1951 DOA: 07/23/2023 PCP: Pearson Grippe, MD  Brief History:  72 year old female with a history of dementia, obesity, frequent UTIs, breast cancer, hypothyroidism presenting from [facility] secondary to altered mental status and generalized weakness.  Apparently, the patient was working with physical therapy, and there was some concern for some subtle left-sided facial weakness and upper extremity weakness.  As result, code stroke was activated.  Initial imaging with a CT brain and MRI brain were negative.  The patient was evaluated by teleneurology.  TNK was deferred secondary to nonfocal exam and patient was felt to be a poor candidate by a modified Rankin scale.. Dr. Wilford Corner felt that her presentation was more consistent with metabolic encephalopathy. Further history revealed that the patient has had increasing generalized weakness for at least the last 2 days prior to admission.  In addition, the patient was more encephalopathic than her usual baseline. In the ED, the patient was somnolent and not able to provide any history. She was afebrile and hemodynamically stable albeit with soft blood pressures.  Oxygen saturation was 96-98% on room air.  WBC 10.0, hemoglobin 8.7, platelets 242,000.  Sodium 131, potassium <2.0, bicarbonate 29, serum creatinine 3.28.  AST 57, ALT 42, alk phosphatase 107, total bilirubin 0.4.  Corrected calcium 8.6.  UA showed 21-50 WBC.  MRI brain was negative for acute findings.  CT brain was negative for any acute findings.  TSH 29.728.  UDS was negative.  The patient was started on IV fluids and admitted for further evaluation and treatment.   Assessment/Plan: Acute metabolic encephalopathy -The patient remains slow to respond and encephalopathic -Multifactorial including AKI, UTI, and hypothyroidism -MR brain neg for acute findings -UA 21-50 WBC -Obtain urine culture--Kleb pneumoniae -Continue IV  fluids -Ammonia <10 -B12--311 -Folic acid 7.3 -8/9--more alert, but not back to baseline   Acute kidney injury -Baseline creatinine 0.5-0.8 -Presented with serum creatinine 3.28 -Secondary to volume depletion -Hold hydrochlorothiazide -Continue IV fluids -renal US--right hydronephrosis (probably due to urinary retention, distended urinary bladder)>>foley placed 8/8 evening   ESBL Klebsiella pneumoniae UTI -d/c ceftriaxone -continue merrem   Urinary retention -this is contributing to the R-hydronephrosis -plan repeat renal US 8/11 to re-eval hydronephrosis now that foley cath was placed on 8/8 evening   Hypokalemia -Repleting -Magnesium 1.7   Generalized weakness secondary to severe hypokalemia of less than 2, UTI   Hypothyroidism TSH elevated at 29.7 today, was WNL-just a month ago at 0.5.  Elevated TSH in the setting of acute illness. Doubt this level of TSH would contribute to encephalopathy.   -free T4 low at 0.54 -increase synthroid to 100 mcg   Dementia (HCC) Nursing home resident.  Ambulates with walker + assistance also.   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  She was also on letrozole therapy.                   Family Communication:  no Family at bedside   Consultants:  none   Code Status:  FULL    DVT Prophylaxis:  [place] Heparin     Procedures: As Listed in Progress Note Above   Antibiotics: Ceftriaxone 8/8>>8/9 Merrem 8/9>>     Subjective:  Patient denies fevers, chills, headache, chest pain, dyspnea, nausea, vomiting, diarrhea, abdominal pain, dysuria, hematuria, hematochezia, and melena.  Objective: Vitals:   07/26/23 1128 07/26/23 1200 07/26/23  1540 07/26/23 1612  BP: (!) 91/29 (!) 96/44 (!) 116/38 (!) 109/49  Pulse: (!) 51 66 (!) 58 66  Resp: 15 19 18 20   Temp: 97.7 F (36.5 C)   98.6 F (37 C)  TempSrc: Axillary   Axillary  SpO2: 100% 100% 100% 100%  Weight:       Height:        Intake/Output Summary (Last 24 hours) at 07/26/2023 1745 Last data filed at 07/26/2023 1555 Gross per 24 hour  Intake 542.6 ml  Output 2875 ml  Net -2332.4 ml   Weight change: 2.1 kg Exam:  General:  Pt is alert, follows commands appropriately, not in acute distress HEENT: No icterus, No thrush, No neck mass, [place]/AT Cardiovascular: RRR, S1/S2, no rubs, no gallops Respiratory: bibasilar crackles.  No wheeze Abdomen: Soft/+BS, non tender, non distended, no guarding Extremities: 1+ LE edema, No lymphangitis, No petechiae, No rashes, no synovitis   Data Reviewed: I have personally reviewed following labs and imaging studies Basic Metabolic Panel: Recent Labs  Lab 07/23/23 1537 07/23/23 1904 07/24/23 0518 07/25/23 0500 07/26/23 0358  NA 131*  --  134* 139 144  K <2.0*  --  2.6* 2.7* 3.3*  CL 85*  --  91* 97* 106  CO2 29  --  27 29 30   GLUCOSE 160*  --  112* 126* 117*  BUN 102*  --  97* 88* 64*  CREATININE 3.28*  --  2.80* 2.07* 1.37*  CALCIUM 7.0*  --  7.5* 7.7* 7.5*  MG  --  1.7  --  2.0 1.7   Liver Function Tests: Recent Labs  Lab 07/23/23 1537 07/26/23 0358  AST 57* 41  ALT 42 33  ALKPHOS 107 68  BILITOT 0.4 0.3  PROT 6.0* 4.5*  ALBUMIN 2.0* <2.0*   No results for input(s): "LIPASE", "AMYLASE" in the last 168 hours. Recent Labs  Lab 07/23/23 1613  AMMONIA <10   Coagulation Profile: Recent Labs  Lab 07/23/23 1537  INR 1.1   CBC: Recent Labs  Lab 07/23/23 1537 07/24/23 0518 07/25/23 0500 07/26/23 0358  WBC 18.0* 17.2* 19.5* 12.7*  NEUTROABS 16.2*  --   --   --   HGB 8.7* 7.8* 8.0* 7.0*  HCT 26.9* 23.9* 24.6* 22.3*  MCV 92.8 90.5 91.4 94.1  PLT 242 357 379 125*   Cardiac Enzymes: No results for input(s): "CKTOTAL", "CKMB", "CKMBINDEX", "TROPONINI" in the last 168 hours. BNP: Invalid input(s): "POCBNP" CBG: Recent Labs  Lab 07/24/23 1625 07/24/23 2059 07/25/23 0723 07/25/23 1124 07/25/23 1634  GLUCAP 108* 113* 116*  190* 167*   HbA1C: No results for input(s): "HGBA1C" in the last 72 hours. Urine analysis:    Component Value Date/Time   COLORURINE YELLOW 07/24/2023 0318   APPEARANCEUR CLOUDY (A) 07/24/2023 0318   LABSPEC 1.010 07/24/2023 0318   PHURINE 7.0 07/24/2023 0318   GLUCOSEU NEGATIVE 07/24/2023 0318   HGBUR LARGE (A) 07/24/2023 0318   BILIRUBINUR NEGATIVE 07/24/2023 0318   KETONESUR NEGATIVE 07/24/2023 0318   PROTEINUR 30 (A) 07/24/2023 0318   NITRITE NEGATIVE 07/24/2023 0318   LEUKOCYTESUR MODERATE (A) 07/24/2023 0318   Sepsis Labs: @LABRCNTIP (procalcitonin:4,lacticidven:4) ) Recent Results (from the past 240 hour(s))  MRSA Next Gen by PCR, Nasal     Status: None   Collection Time: 07/23/23  9:08 PM   Specimen: Nasal Mucosa; Nasal Swab  Result Value Ref Range Status   MRSA by PCR Next Gen NOT DETECTED NOT DETECTED Final    Comment: (NOTE)  The GeneXpert MRSA Assay (FDA approved for NASAL specimens only), is one component of a comprehensive MRSA colonization surveillance program. It is not intended to diagnose MRSA infection nor to guide or monitor treatment for MRSA infections. Test performance is not FDA approved in patients less than 70 years old. Performed at [facility], [address], [place], [place] [address]   Urine Culture (for pregnant, neutropenic or urologic patients or patients with an indwelling urinary catheter)     Status: Abnormal   Collection Time: 07/24/23  9:32 AM   Specimen: Urine, Catheterized  Result Value Ref Range Status   Specimen Description   Final    URINE, CATHETERIZED Performed at [facility], [address], [place], [place] [address]    Special Requests   Final    NONE Performed at [facility] [address] [place], [place] [address]    Culture (A)  Final    90,000 COLONIES/mL KLEBSIELLA PNEUMONIAE Confirmed Extended Spectrum Beta-Lactamase Producer (ESBL).  In bloodstream infections from ESBL organisms, carbapenems are preferred over  piperacillin/tazobactam. They are shown to have a lower risk of mortality.    Report Status 07/26/2023 FINAL  Final   Organism ID, Bacteria KLEBSIELLA PNEUMONIAE (A)  Final      Susceptibility   Klebsiella pneumoniae - MIC*    AMPICILLIN >=32 RESISTANT Resistant     CEFAZOLIN >=64 RESISTANT Resistant     CEFEPIME 2 SENSITIVE Sensitive     CEFTRIAXONE >=64 RESISTANT Resistant     CIPROFLOXACIN 0.5 INTERMEDIATE Intermediate     GENTAMICIN <=1 SENSITIVE Sensitive     IMIPENEM <=0.25 SENSITIVE Sensitive     NITROFURANTOIN 128 RESISTANT Resistant     TRIMETH/SULFA >=320 RESISTANT Resistant     AMPICILLIN/SULBACTAM 8 SENSITIVE Sensitive     PIP/TAZO <=4 SENSITIVE Sensitive     * 90,000 COLONIES/mL KLEBSIELLA PNEUMONIAE     Scheduled Meds:  Chlorhexidine Gluconate Cloth  6 each Topical Q0600   donepezil  10 mg Oral Daily   heparin  5,000 Units Subcutaneous Q8H   levothyroxine  100 mcg Oral QAC breakfast   Continuous Infusions:  0.9 % NaCl with KCl 40 mEq / L 75 mL/hr at 07/26/23 0803   meropenem (MERREM) IV 1 g (07/26/23 1645)    Procedures/Studies: US RENAL  Result Date: 07/24/2023 CLINICAL DATA:  Acute kidney injury. EXAM: RENAL / URINARY TRACT ULTRASOUND COMPLETE COMPARISON:  None Available. FINDINGS: Right Kidney: Renal measurements: 11.3 x 6.9 x 6.8 cm = volume: 276 mL. Echogenicity within normal limits. Moderate hydronephrosis is noted. 1.8 cm exophytic cyst is seen from midpole. Left Kidney: Renal measurements: 13.0 x 8.1 x 7.3 cm = volume: 401 mL. Two simple cysts are noted, the largest measuring 3.8 cm in upper pole. Echogenicity within normal limits. No mass or hydronephrosis visualized. Bladder: Calculated volume of 1981 mL.  Ureteral jets are not visualized. Other: None. IMPRESSION: Moderate right hydronephrosis.  Bilateral renal cysts. Electronically Signed   By: Lupita Raider M.D.   On: 07/24/2023 13:47   DG Chest Port 1 View  Result Date: 07/23/2023 CLINICAL DATA:   Altered mental status. EXAM: PORTABLE CHEST 1 VIEW COMPARISON:  08/22/2010. FINDINGS: Elevated right hemidiaphragm. Clear lungs. Normal heart size. No mediastinal widening. No pleural effusion or pneumothorax. IMPRESSION: No evidence of acute cardiopulmonary disease. Elevated right hemidiaphragm. Electronically Signed   By: Orvan Falconer M.D.   On: 07/23/2023 17:11   MR BRAIN WO CONTRAST  Result Date: 07/23/2023 CLINICAL DATA:  Neuro deficit,  acute, stroke suspected. Left-sided facial droop and weakness. EXAM: MRI HEAD WITHOUT CONTRAST TECHNIQUE: Multiplanar, multiecho pulse sequences of the brain and surrounding structures were obtained without intravenous contrast. COMPARISON:  Head CT earlier same day. FINDINGS: Brain: Diffusion imaging does not show any acute or subacute infarction. Mild chronic small-vessel ischemic change affects the pons. No focal cerebellar insult. Cerebral hemispheres show moderate chronic small-vessel ischemic changes of the white matter and the right basal ganglia. No large vessel territory stroke. No mass, hemorrhage, hydrocephalus or extra-axial collection. Vascular: Major vessels at the base of the brain show flow. Skull and upper cervical spine: Negative Sinuses/Orbits: Clear/normal Other: Tiny amount of mastoid fluid on the right, probably not significant. IMPRESSION: No acute finding. Moderate chronic small-vessel ischemic changes of the pons and cerebral hemispheric white matter. Electronically Signed   By: Paulina Fusi M.D.   On: 07/23/2023 15:08   CT HEAD CODE STROKE WO CONTRAST  Result Date: 07/23/2023 CLINICAL DATA:  Code stroke. Neuro deficit, acute, stroke suspected. Left-sided facial droop and weakness. EXAM: CT HEAD WITHOUT CONTRAST TECHNIQUE: Contiguous axial images were obtained from the base of the skull through the vertex without intravenous contrast. RADIATION DOSE REDUCTION: This exam was performed according to the departmental dose-optimization program which  includes automated exposure control, adjustment of the mA and/or kV according to patient size and/or use of iterative reconstruction technique. COMPARISON:  Head CT 05/28/2023. FINDINGS: Brain: No acute hemorrhage. Unchanged moderate chronic small-vessel disease. Cortical gray-white differentiation is otherwise preserved. Prominence of the ventricles and sulci within expected range for age. No hydrocephalus or extra-axial collection. No mass effect or midline shift. Vascular: No hyperdense vessel or unexpected calcification. Skull: No calvarial fracture or suspicious bone lesion. Skull base is unremarkable. Sinuses/Orbits: No acute finding. Other: None. ASPECTS [facility] Stroke Program Early CT Score) - Ganglionic level infarction (caudate, lentiform nuclei, internal capsule, insula, M1-M3 cortex): 7 - Supraganglionic infarction (M4-M6 cortex): 3 Total score (0-10 with 10 being normal): 10 IMPRESSION: 1. No acute intracranial hemorrhage or evidence of acute large vessel territory infarct. ASPECT score is 10. 2. Stable moderate chronic small-vessel disease. Code stroke imaging results were communicated on 07/23/2023 at 2:30 pm to provider Dr. Audrie Lia via telephone, who verbally acknowledged these results. Electronically Signed   By: Orvan Falconer M.D.   On: 07/23/2023 14:30    Catarina Hartshorn, DO  [facility]  If 7PM-7AM, please contact night-coverage [email or web address] Password [email or web address] 07/26/2023, 5:45 PM   LOS: 3 days

## 2023-07-27 ENCOUNTER — Inpatient Hospital Stay (HOSPITAL_COMMUNITY): Payer: Medicare Other

## 2023-07-27 DIAGNOSIS — N3001 Acute cystitis with hematuria: Secondary | ICD-10-CM

## 2023-07-27 DIAGNOSIS — N179 Acute kidney failure, unspecified: Secondary | ICD-10-CM | POA: Diagnosis not present

## 2023-07-27 DIAGNOSIS — G9341 Metabolic encephalopathy: Secondary | ICD-10-CM | POA: Diagnosis not present

## 2023-07-27 DIAGNOSIS — E876 Hypokalemia: Secondary | ICD-10-CM | POA: Diagnosis not present

## 2023-07-27 LAB — CBC
HCT: 22.4 % — ABNORMAL LOW (ref 36.0–46.0)
Hemoglobin: 6.9 g/dL — CL (ref 12.0–15.0)
MCH: 29.6 pg (ref 26.0–34.0)
MCHC: 30.8 g/dL (ref 30.0–36.0)
MCV: 96.1 fL (ref 80.0–100.0)
Platelets: 311 10*3/uL (ref 150–400)
RBC: 2.33 MIL/uL — ABNORMAL LOW (ref 3.87–5.11)
RDW: 15.3 % (ref 11.5–15.5)
WBC: 10.8 10*3/uL — ABNORMAL HIGH (ref 4.0–10.5)
nRBC: 0 % (ref 0.0–0.2)

## 2023-07-27 LAB — BASIC METABOLIC PANEL WITH GFR
Anion gap: 5 (ref 5–15)
BUN: 44 mg/dL — ABNORMAL HIGH (ref 8–23)
CO2: 28 mmol/L (ref 22–32)
Calcium: 7.5 mg/dL — ABNORMAL LOW (ref 8.9–10.3)
Chloride: 109 mmol/L (ref 98–111)
Creatinine, Ser: 1.04 mg/dL — ABNORMAL HIGH (ref 0.44–1.00)
GFR, Estimated: 56 mL/min — ABNORMAL LOW (ref >60)
Glucose, Bld: 119 mg/dL — ABNORMAL HIGH (ref 70–99)
Potassium: 4.1 mmol/L (ref 3.5–5.1)
Sodium: 142 mmol/L (ref 135–145)

## 2023-07-27 LAB — PREPARE RBC (CROSSMATCH)

## 2023-07-27 LAB — BPAM RBC
Blood Product Expiration Date: 202409112359
ISSUE DATE / TIME: 202408111058
Unit Type and Rh: 5100

## 2023-07-27 LAB — TYPE AND SCREEN
ABO/RH(D): O POS
Antibody Screen: NEGATIVE
Unit division: 0

## 2023-07-27 LAB — MAGNESIUM: Magnesium: 2 mg/dL (ref 1.7–2.4)

## 2023-07-27 MED ORDER — SODIUM CHLORIDE 0.9% IV SOLUTION: Freq: Once | INTRAVENOUS | Status: AC

## 2023-07-27 MED ORDER — ADULT MULTIVITAMIN W/MINERALS CH
1.0000 | ORAL_TABLET | Freq: Every day | ORAL | Status: DC
  Administered 2023-07-27 – 2023-07-28 (×2): 1 via ORAL
  Filled 2023-07-27 (×2): qty 1

## 2023-07-27 MED ORDER — ENSURE ENLIVE PO LIQD
237.0000 mL | Freq: Two times a day (BID) | ORAL | Status: DC
  Administered 2023-07-27 – 2023-07-28 (×3): 237 mL via ORAL

## 2023-07-27 MED ORDER — SODIUM CHLORIDE 0.9 % IV SOLN
1.0000 g | Freq: Three times a day (TID) | INTRAVENOUS | Status: DC
  Administered 2023-07-27 – 2023-07-28 (×3): 1 g via INTRAVENOUS
  Filled 2023-07-27 (×3): qty 20

## 2023-07-27 NOTE — Progress Notes (Signed)
Pharmacy Antibiotic Note  Deanna Henderson is a 72 y.o. female admitted on 07/23/2023 with UTI ESBL infection.   Pharmacy has been consulted for Merrem dosing. Renal fxn improved. Patient slow to respond, encephalopathic.   Plan: Increase Merrem 1gm IV q8h F/u Cx and clinical progress Deescalate as indicated  Height: 5\' 5"  (165.1 cm) Weight: 96.3 kg (212 lb 4.9 oz) IBW/kg (Calculated) : 57  Temp (24hrs), Avg:98.1 F (36.7 C), Min:97.7 F (36.5 C), Max:98.6 F (37 C)  Recent Labs  Lab 07/23/23 1537 07/24/23 0518 07/25/23 0500 07/26/23 0358 07/27/23 0536 07/27/23 0651  WBC 18.0* 17.2* 19.5* 12.7*  --  10.8*  CREATININE 3.28* 2.80* 2.07* 1.37* 1.04*  --     Estimated Creatinine Clearance: 54.5 mL/min (A) (by C-G formula based on SCr of 1.04 mg/dL (H)).    Allergies  Allergen Reactions   Codeine Nausea Only    Antimicrobials this admission: Merrem  8/9 >>  ceftriaxone 8/8 >> 8/9   Microbiology results: 8/8 urine cx 90 K CFU/ml K. Pneumo ESBL S- imipenem 8/7 MRSA PCR: negative  Thank you for allowing pharmacy to be a part of this patient's care.  Elder Cyphers, BS Pharm D, BCPS Clinical Pharmacist 07/27/2023 9:02 AM

## 2023-07-27 NOTE — Progress Notes (Signed)
PT Cancellation Note  Patient Details Name: Deanna Henderson MRN: [id number] DOB: August 04, 1951   Cancelled Treatment:    Reason Eval/Treat Not Completed: Patient at procedure or test/unavailable  Pt with other discipline. PT will follow and evaluate as schedule permits.   Nelida Meuse 07/27/2023, 8:21 AM

## 2023-07-27 NOTE — Progress Notes (Signed)
Initial Nutrition Assessment  DOCUMENTATION CODES:   Obesity unspecified  INTERVENTION:   -Liberalize diet to regular for wider variety of meal selections -MVI with minerals daily -Continue Ensure Enlive po BID, each supplement provides 350 kcal and 20 grams of protein  NUTRITION DIAGNOSIS:   Increased nutrient needs related to acute illness as evidenced by estimated needs.  GOAL:   Patient will meet greater than or equal to 90% of their needs  MONITOR:   PO intake, Supplement acceptance  REASON FOR ASSESSMENT:   Malnutrition Screening Tool    ASSESSMENT:   Pt with a history of dementia, obesity, frequent UTIs, breast cancer, hypothyroidism presenting secondary to altered mental status and generalized weakness.  Pt admitted with acute metabolic encephalopathy, AKI, and UTI.  Reviewed I/O's: -611 ml x 24 ours and -2.6 L since admission  UOP: 2.1 L x 24 hours  Pt unavailable at time of visit. Attempted to speak with pt via call to hospital room phone, however, unable to reach. RD unable to obtain further nutrition-related history or complete nutrition-focused physical exam at this time.    Pt is a resident of [facility].  Pt currently on a heart healthy diet with variable oral intake. Noted meal completions 10-100%, averaging around 50% of meals. Ensure supplements have been ordered and pt has been drinking them.   Reviewed wt hx; wt has been stable over the past year. Noted pt with moderate edema, which may be masking true weight loss as well as fat and muscle depletions.  No results found for: "HGBA1C" PTA DM medications are none.   Labs reviewed: CBGS: 167 (inpatient orders for glycemic control are none).    Diet Order:   Diet Order             Diet Heart Fluid consistency: Thin  Diet effective now                   EDUCATION NEEDS:   No education needs have been identified at this time  Skin:  Skin Assessment: Skin Integrity  Issues: Skin Integrity Issues:: Other (Comment) Other: skin tear to perineum  Last BM:  07/25/23  Height:   Ht Readings from Last 1 Encounters:  07/23/23 5\' 5"  (1.651 m)    Weight:   Wt Readings from Last 1 Encounters:  07/26/23 96.3 kg    Ideal Body Weight:  56.8 kg  BMI:  Body mass index is 35.33 kg/m.  Estimated Nutritional Needs:   Kcal:  1500-1700  Protein:  75-90 grams  Fluid:  > 1.5 L    Levada Schilling, RD, LDN, CDCES Registered Dietitian II Certified Diabetes Care and Education Specialist Please refer to [facility] for RD and/or RD on-call/weekend/after hours pager

## 2023-07-27 NOTE — Progress Notes (Signed)
Previous IV site infiltrated once blood transfusion began. Site removed and, after multiple attempts at restart by multiple staff members, #24G IV obtained in right lateral wrist. Blood products restarted and monitoring continues.

## 2023-07-27 NOTE — Progress Notes (Signed)
PT Cancellation Note  Patient Details Name: ZEYNAB PIKUS MRN: [id number] DOB: [birth year]   Cancelled Treatment:    Reason Eval/Treat Not Completed: Medical issues which prohibited therapy  2nd attempt to perform evaluation. Initial review with RN. Pt requiring blood transfusion, RN reporting pt with Hgb < 7.0. RN recommended holding PT evaluation. Physical Therapy will follow and evaluate when medically appropriate as schedule permits.    Nelida Meuse 07/27/2023, 8:54 AM

## 2023-07-27 NOTE — Progress Notes (Signed)
PROGRESS NOTE  Deanna Henderson GLO:[id number] DOB: January 16, 1951 DOA: 07/23/2023 PCP: Pearson Grippe, MD  Brief History:  72 year old female with a history of dementia, obesity, frequent UTIs, breast cancer, hypothyroidism presenting from [facility] secondary to altered mental status and generalized weakness.  Apparently, the patient was working with physical therapy, and there was some concern for some subtle left-sided facial weakness and upper extremity weakness.  As result, code stroke was activated.  Initial imaging with a CT brain and MRI brain were negative.  The patient was evaluated by teleneurology.  TNK was deferred secondary to nonfocal exam and patient was felt to be a poor candidate by a modified Rankin scale.. Dr. Wilford Corner felt that her presentation was more consistent with metabolic encephalopathy. Further history revealed that the patient has had increasing generalized weakness for at least the last 2 days prior to admission.  In addition, the patient was more encephalopathic than her usual baseline. In the ED, the patient was somnolent and not able to provide any history. She was afebrile and hemodynamically stable albeit with soft blood pressures.  Oxygen saturation was 96-98% on room air.  WBC 10.0, hemoglobin 8.7, platelets 242,000.  Sodium 131, potassium <2.0, bicarbonate 29, serum creatinine 3.28.  AST 57, ALT 42, alk phosphatase 107, total bilirubin 0.4.  Corrected calcium 8.6.  UA showed 21-50 WBC.  MRI brain was negative for acute findings.  CT brain was negative for any acute findings.  TSH 29.728.  UDS was negative.  The patient was started on IV fluids and admitted for further evaluation and treatment.   Assessment/Plan: Acute metabolic encephalopathy -The patient remains slow to respond and encephalopathic -Multifactorial including AKI, UTI, and hypothyroidism -MR brain neg for acute findings -UA 21-50 WBC -Obtain urine culture--Kleb pneumoniae -Continue IV  fluids -Ammonia <10 -B12--311 -Folic acid 7.3 -8/9--more alert, but not back to baseline -8/10--back to baseline   Acute kidney injury -Baseline creatinine 0.5-0.8 -Presented with serum creatinine 3.28 -Secondary to volume depletion -Hold hydrochlorothiazide -Continue IV fluids -8/8 renal US--right hydronephrosis (probably due to urinary retention, distended urinary bladder)>>foley placed 8/8 evening -8/11-renal US--mild bilateral hydronephrosis--improved since 8/8   ESBL Klebsiella pneumoniae UTI -d/c ceftriaxone -continue merrem   Urinary retention -this is contributing to the R-hydronephrosis -plan repeat renal US 8/11 to re-eval hydronephrosis now that foley cath was placed on 8/8 evening -8/11-renal US--mild bilateral hydronephrosis--improved since 8/8   Hypokalemia -Repleting -Magnesium 1.7   Generalized weakness secondary to severe hypokalemia of less than 2, UTI   Hypothyroidism TSH elevated at 29.7 today, was WNL-just a month ago at 0.5.  Elevated TSH in the setting of acute illness. Doubt this level of TSH would contribute to encephalopathy.   -free T4 low at 0.54 -increase synthroid to 100 mcg   Dementia (HCC) Nursing home resident.  Ambulates with walker + assistance also.   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  She was also on letrozole therapy.       Subjective: Patient denies fevers, chills, headache, chest pain, dyspnea, nausea, vomiting, diarrhea, abdominal pain, dysuria, hematuria   Objective: Vitals:   07/27/23 1045 07/27/23 1118 07/27/23 1130 07/27/23 1508  BP: (!) 102/45 (!) 102/45 (!) 106/52 (!) 104/53  Pulse: 70 70 76 70  Resp: 18 18 18 18   Temp: 98.4 F (36.9 C) 98.4 F (36.9 C) 98.2 F (36.8 C) 98.3 F (36.8 C)  TempSrc: Oral  Oral Oral  SpO2: 100%  100% 97%  Weight:      Height:        Intake/Output Summary (Last 24 hours) at 07/27/2023 1616 Last data filed at 07/27/2023  1512 Gross per 24 hour  Intake 2227.15 ml  Output 1700 ml  Net 527.15 ml   Weight change:  Exam:  General:  Pt is alert, follows commands appropriately, not in acute distress HEENT: No icterus, No thrush, No neck mass, [place]/AT Cardiovascular: RRR, S1/S2, no rubs, no gallops Respiratory: CTA bilaterally, no wheezing, no crackles, no rhonchi Abdomen: Soft/+BS, non tender, non distended, no guarding Extremities: Non pitting edema, No lymphangitis, No petechiae, No rashes, no synovitis   Data Reviewed: I have personally reviewed following labs and imaging studies Basic Metabolic Panel: Recent Labs  Lab 07/23/23 1537 07/23/23 1904 07/24/23 0518 07/25/23 0500 07/26/23 0358 07/27/23 0536  NA 131*  --  134* 139 144 142  K <2.0*  --  2.6* 2.7* 3.3* 4.1  CL 85*  --  91* 97* 106 109  CO2 29  --  27 29 30 28   GLUCOSE 160*  --  112* 126* 117* 119*  BUN 102*  --  97* 88* 64* 44*  CREATININE 3.28*  --  2.80* 2.07* 1.37* 1.04*  CALCIUM 7.0*  --  7.5* 7.7* 7.5* 7.5*  MG  --  1.7  --  2.0 1.7 2.0   Liver Function Tests: Recent Labs  Lab 07/23/23 1537 07/26/23 0358  AST 57* 41  ALT 42 33  ALKPHOS 107 68  BILITOT 0.4 0.3  PROT 6.0* 4.5*  ALBUMIN 2.0* <2.0*   No results for input(s): "LIPASE", "AMYLASE" in the last 168 hours. Recent Labs  Lab 07/23/23 1613  AMMONIA <10   Coagulation Profile: Recent Labs  Lab 07/23/23 1537  INR 1.1   CBC: Recent Labs  Lab 07/23/23 1537 07/24/23 0518 07/25/23 0500 07/26/23 0358 07/27/23 0651  WBC 18.0* 17.2* 19.5* 12.7* 10.8*  NEUTROABS 16.2*  --   --   --   --   HGB 8.7* 7.8* 8.0* 7.0* 6.9*  HCT 26.9* 23.9* 24.6* 22.3* 22.4*  MCV 92.8 90.5 91.4 94.1 96.1  PLT 242 357 379 125* 311   Cardiac Enzymes: No results for input(s): "CKTOTAL", "CKMB", "CKMBINDEX", "TROPONINI" in the last 168 hours. BNP: Invalid input(s): "POCBNP" CBG: Recent Labs  Lab 07/24/23 1625 07/24/23 2059 07/25/23 0723 07/25/23 1124 07/25/23 1634  GLUCAP  108* 113* 116* 190* 167*   HbA1C: No results for input(s): "HGBA1C" in the last 72 hours. Urine analysis:    Component Value Date/Time   COLORURINE YELLOW 07/24/2023 0318   APPEARANCEUR CLOUDY (A) 07/24/2023 0318   LABSPEC 1.010 07/24/2023 0318   PHURINE 7.0 07/24/2023 0318   GLUCOSEU NEGATIVE 07/24/2023 0318   HGBUR LARGE (A) 07/24/2023 0318   BILIRUBINUR NEGATIVE 07/24/2023 0318   KETONESUR NEGATIVE 07/24/2023 0318   PROTEINUR 30 (A) 07/24/2023 0318   NITRITE NEGATIVE 07/24/2023 0318   LEUKOCYTESUR MODERATE (A) 07/24/2023 0318   Sepsis Labs: @LABRCNTIP (procalcitonin:4,lacticidven:4) ) Recent Results (from the past 240 hour(s))  MRSA Next Gen by PCR, Nasal     Status: None   Collection Time: 07/23/23  9:08 PM   Specimen: Nasal Mucosa; Nasal Swab  Result Value Ref Range Status   MRSA by PCR Next Gen NOT DETECTED NOT DETECTED Final    Comment: (NOTE) The GeneXpert MRSA Assay (FDA approved for NASAL specimens only), is one component of a comprehensive MRSA colonization surveillance program.  It is not intended to diagnose MRSA infection nor to guide or monitor treatment for MRSA infections. Test performance is not FDA approved in patients less than 76 years old. Performed at [facility], [address], [place], [place] [address]   Urine Culture (for pregnant, neutropenic or urologic patients or patients with an indwelling urinary catheter)     Status: Abnormal   Collection Time: 07/24/23  9:32 AM   Specimen: Urine, Catheterized  Result Value Ref Range Status   Specimen Description   Final    URINE, CATHETERIZED Performed at [facility], [address] [place], [place] [address]    Special Requests   Final    NONE Performed at [facility], [address], [place], [place] [address]    Culture (A)  Final    90,000 COLONIES/mL KLEBSIELLA PNEUMONIAE Confirmed Extended Spectrum Beta-Lactamase Producer (ESBL).  In bloodstream infections from ESBL organisms, carbapenems are  preferred over piperacillin/tazobactam. They are shown to have a lower risk of mortality.    Report Status 07/26/2023 FINAL  Final   Organism ID, Bacteria KLEBSIELLA PNEUMONIAE (A)  Final      Susceptibility   Klebsiella pneumoniae - MIC*    AMPICILLIN >=32 RESISTANT Resistant     CEFAZOLIN >=64 RESISTANT Resistant     CEFEPIME 2 SENSITIVE Sensitive     CEFTRIAXONE >=64 RESISTANT Resistant     CIPROFLOXACIN 0.5 INTERMEDIATE Intermediate     GENTAMICIN <=1 SENSITIVE Sensitive     IMIPENEM <=0.25 SENSITIVE Sensitive     NITROFURANTOIN 128 RESISTANT Resistant     TRIMETH/SULFA >=320 RESISTANT Resistant     AMPICILLIN/SULBACTAM 8 SENSITIVE Sensitive     PIP/TAZO <=4 SENSITIVE Sensitive     * 90,000 COLONIES/mL KLEBSIELLA PNEUMONIAE     Scheduled Meds:  Chlorhexidine Gluconate Cloth  6 each Topical Q0600   donepezil  10 mg Oral Daily   feeding supplement  237 mL Oral BID BM   heparin  5,000 Units Subcutaneous Q8H   levothyroxine  100 mcg Oral QAC breakfast   Continuous Infusions:  meropenem (MERREM) IV 1 g (07/27/23 1534)    Procedures/Studies: US RENAL  Result Date: 07/27/2023 CLINICAL DATA:  Inpatient, [id number] AKI (acute kidney injury) [facility]) [id number] Hydronephrosis of right kidney [id number] EXAM: RENAL / URINARY TRACT ULTRASOUND COMPLETE COMPARISON:  07/24/2023 renal sonogram FINDINGS: Right Kidney: Renal measurements: 13.0 x 5.8 x 5.8 cm = volume: 227 mL. Simple 1.7 x 1.3 x 1.7 cm upper right renal cyst. Echogenic right renal parenchyma, normal thickness. Mild right hydronephrosis, improved. Left Kidney: Renal measurements: 14.0 x 7.1 x 7.3 cm = volume: 378 mL. Echogenic left renal parenchyma, normal thickness. Simple 3.7 x 3.2 x 3.5 cm upper left renal cyst. Mild left hydronephrosis, improved. Bladder: Bladder completely collapsed by indwelling Foley catheter, precluding bladder assessment. Other: None. IMPRESSION: 1. Mild bilateral hydronephrosis, improved bilaterally since  07/24/2023 renal sonogram. 2. Bladder completely collapsed by indwelling Foley catheter, precluding bladder assessment. 3. Echogenic normal size kidneys, compatible with reported nonspecific acute renal parenchymal disease. 4. Simple bilateral renal cysts, for which no follow-up imaging is recommended. Electronically Signed   By: Delbert Phenix M.D.   On: 07/27/2023 15:38   US RENAL  Result Date: 07/24/2023 CLINICAL DATA:  Acute kidney injury. EXAM: RENAL / URINARY TRACT ULTRASOUND COMPLETE COMPARISON:  None Available. FINDINGS: Right Kidney: Renal measurements: 11.3 x 6.9 x 6.8 cm = volume: 276 mL. Echogenicity within normal limits. Moderate hydronephrosis is noted. 1.8 cm exophytic cyst is seen  from midpole. Left Kidney: Renal measurements: 13.0 x 8.1 x 7.3 cm = volume: 401 mL. Two simple cysts are noted, the largest measuring 3.8 cm in upper pole. Echogenicity within normal limits. No mass or hydronephrosis visualized. Bladder: Calculated volume of 1981 mL.  Ureteral jets are not visualized. Other: None. IMPRESSION: Moderate right hydronephrosis.  Bilateral renal cysts. Electronically Signed   By: Lupita Raider M.D.   On: 07/24/2023 13:47   DG Chest Port 1 View  Result Date: 07/23/2023 CLINICAL DATA:  Altered mental status. EXAM: PORTABLE CHEST 1 VIEW COMPARISON:  08/22/2010. FINDINGS: Elevated right hemidiaphragm. Clear lungs. Normal heart size. No mediastinal widening. No pleural effusion or pneumothorax. IMPRESSION: No evidence of acute cardiopulmonary disease. Elevated right hemidiaphragm. Electronically Signed   By: Orvan Falconer M.D.   On: 07/23/2023 17:11   MR BRAIN WO CONTRAST  Result Date: 07/23/2023 CLINICAL DATA:  Neuro deficit, acute, stroke suspected. Left-sided facial droop and weakness. EXAM: MRI HEAD WITHOUT CONTRAST TECHNIQUE: Multiplanar, multiecho pulse sequences of the brain and surrounding structures were obtained without intravenous contrast. COMPARISON:  Head CT earlier same day.  FINDINGS: Brain: Diffusion imaging does not show any acute or subacute infarction. Mild chronic small-vessel ischemic change affects the pons. No focal cerebellar insult. Cerebral hemispheres show moderate chronic small-vessel ischemic changes of the white matter and the right basal ganglia. No large vessel territory stroke. No mass, hemorrhage, hydrocephalus or extra-axial collection. Vascular: Major vessels at the base of the brain show flow. Skull and upper cervical spine: Negative Sinuses/Orbits: Clear/normal Other: Tiny amount of mastoid fluid on the right, probably not significant. IMPRESSION: No acute finding. Moderate chronic small-vessel ischemic changes of the pons and cerebral hemispheric white matter. Electronically Signed   By: Paulina Fusi M.D.   On: 07/23/2023 15:08   CT HEAD CODE STROKE WO CONTRAST  Result Date: 07/23/2023 CLINICAL DATA:  Code stroke. Neuro deficit, acute, stroke suspected. Left-sided facial droop and weakness. EXAM: CT HEAD WITHOUT CONTRAST TECHNIQUE: Contiguous axial images were obtained from the base of the skull through the vertex without intravenous contrast. RADIATION DOSE REDUCTION: This exam was performed according to the departmental dose-optimization program which includes automated exposure control, adjustment of the mA and/or kV according to patient size and/or use of iterative reconstruction technique. COMPARISON:  Head CT 05/28/2023. FINDINGS: Brain: No acute hemorrhage. Unchanged moderate chronic small-vessel disease. Cortical gray-white differentiation is otherwise preserved. Prominence of the ventricles and sulci within expected range for age. No hydrocephalus or extra-axial collection. No mass effect or midline shift. Vascular: No hyperdense vessel or unexpected calcification. Skull: No calvarial fracture or suspicious bone lesion. Skull base is unremarkable. Sinuses/Orbits: No acute finding. Other: None. ASPECTS [facility] Stroke Program Early CT Score) -  Ganglionic level infarction (caudate, lentiform nuclei, internal capsule, insula, M1-M3 cortex): 7 - Supraganglionic infarction (M4-M6 cortex): 3 Total score (0-10 with 10 being normal): 10 IMPRESSION: 1. No acute intracranial hemorrhage or evidence of acute large vessel territory infarct. ASPECT score is 10. 2. Stable moderate chronic small-vessel disease. Code stroke imaging results were communicated on 07/23/2023 at 2:30 pm to provider Dr. Audrie Lia via telephone, who verbally acknowledged these results. Electronically Signed   By: Orvan Falconer M.D.   On: 07/23/2023 14:30    Catarina Hartshorn, DO  [facility]  If 7PM-7AM, please contact night-coverage [email or web address] Password [email or web address] 07/27/2023, 4:16 PM   LOS: 4 days

## 2023-07-27 NOTE — Plan of Care (Signed)
Pt receiving PRBC's for Hgb 6.9, tolerating well. Plan of care ongoing.

## 2023-07-28 DIAGNOSIS — G9341 Metabolic encephalopathy: Secondary | ICD-10-CM | POA: Diagnosis not present

## 2023-07-28 DIAGNOSIS — E876 Hypokalemia: Secondary | ICD-10-CM | POA: Diagnosis not present

## 2023-07-28 DIAGNOSIS — N179 Acute kidney failure, unspecified: Secondary | ICD-10-CM | POA: Diagnosis not present

## 2023-07-28 DIAGNOSIS — N3090 Cystitis, unspecified without hematuria: Secondary | ICD-10-CM | POA: Diagnosis not present

## 2023-07-28 MED ORDER — FOSFOMYCIN TROMETHAMINE 3 G PO PACK
3.0000 g | PACK | Freq: Once | ORAL | Status: AC
  Administered 2023-07-28: 3 g via ORAL
  Filled 2023-07-28: qty 3

## 2023-07-28 MED ORDER — LEVOTHYROXINE SODIUM 100 MCG PO TABS: 100.0000 ug | ORAL_TABLET | Freq: Every day | ORAL | Status: DC

## 2023-07-28 NOTE — Plan of Care (Signed)
  Problem: Clinical Measurements: Goal: Will remain free from infection Outcome: Progressing Goal: Diagnostic test results will improve Outcome: Progressing   

## 2023-07-28 NOTE — Progress Notes (Signed)
Spoke with Leeroy Bock, RN  at [facility], provided report and explained patient will come back to facility soon.

## 2023-07-28 NOTE — Plan of Care (Signed)
  Problem: Acute Rehab PT Goals(only PT should resolve) Goal: Pt Will Go Supine/Side To Sit Outcome: Progressing Flowsheets (Taken 07/28/2023 1205) Pt will go Supine/Side to Sit: with minimal assist Goal: Patient Will Transfer Sit To/From Stand Outcome: Progressing Flowsheets (Taken 07/28/2023 1205) Patient will transfer sit to/from stand: with minimal assist Goal: Pt Will Transfer Bed To Chair/Chair To Bed Outcome: Progressing Flowsheets (Taken 07/28/2023 1205) Pt will Transfer Bed to Chair/Chair to Bed: with min assist Goal: Pt Will Ambulate Outcome: Progressing Flowsheets (Taken 07/28/2023 1205) Pt will Ambulate:  15 feet  with moderate assist     SPT [employer], DPT Program

## 2023-07-28 NOTE — Discharge Summary (Signed)
Physician Discharge Summary   Patient: Deanna Henderson MRN: [id number] DOB: 08-17-1951  Admit date:     07/23/2023  Discharge date: 07/28/23  Discharge Physician: Onalee Hua    PCP: Pearson Grippe, MD   Recommendations at discharge:  { Please follow up with primary care provider within 1-2 weeks  Please repeat BMP and CBC in one week    Hospital Course: 72 year old female with a history of dementia, obesity, frequent UTIs, breast cancer, hypothyroidism presenting from [facility] secondary to altered mental status and generalized weakness.  Apparently, the patient was working with physical therapy, and there was some concern for some subtle left-sided facial weakness and upper extremity weakness.  As result, code stroke was activated.  Initial imaging with a CT brain and MRI brain were negative.  The patient was evaluated by teleneurology.  TNK was deferred secondary to nonfocal exam and patient was felt to be a poor candidate by a modified Rankin scale.. Dr. Wilford Corner felt that her presentation was more consistent with metabolic encephalopathy. Further history revealed that the patient has had increasing generalized weakness for at least the last 2 days prior to admission.  In addition, the patient was more encephalopathic than her usual baseline. In the ED, the patient was somnolent and not able to provide any history. She was afebrile and hemodynamically stable albeit with soft blood pressures.  Oxygen saturation was 96-98% on room air.  WBC 10.0, hemoglobin 8.7, platelets 242,000.  Sodium 131, potassium <2.0, bicarbonate 29, serum creatinine 3.28.  AST 57, ALT 42, alk phosphatase 107, total bilirubin 0.4.  Corrected calcium 8.6.  UA showed 21-50 WBC.  MRI brain was negative for acute findings.  CT brain was negative for any acute findings.  TSH 29.728.  UDS was negative.  The patient was started on IV fluids and admitted for further evaluation and treatment.  Assessment and Plan: Acute metabolic  encephalopathy -The patient remains slow to respond and encephalopathic -Multifactorial including AKI, UTI, and hypothyroidism -MR brain neg for acute findings -UA 21-50 WBC -Obtain urine culture--Kleb pneumoniae -Continued IV fluids -Ammonia <10 -B12--311 -Folic acid 7.3 -8/9--more alert, but not back to baseline -8/10--back to baseline   Acute kidney injury -Baseline creatinine 0.5-0.8 -Presented with serum creatinine 3.28 -Secondary to volume depletion -Hold hydrochlorothiazide>>will not restart -Continue IV fluids -8/8 renal US--right hydronephrosis (probably due to urinary retention, distended urinary bladder)>>foley placed 8/8 evening -8/11-renal US--mild bilateral hydronephrosis--improved since 8/8 -seurm creatinine 0.95 on day of dc   ESBL Klebsiella pneumoniae UTI -d/c ceftriaxone -continue merrem--received approx 4 days -give fosfomycin x 1 one day of d/c to finish therapy   Urinary retention -this is contributing to the R-hydronephrosis -plan repeat renal US 8/11 to re-eval hydronephrosis now that foley cath was placed on 8/8 evening -8/11-renal US--mild bilateral hydronephrosis--improved since 8/8 -8/11--foley removed and patient was able to urinate thereafter   Hypokalemia -Repleted -Magnesium 1.7>>2.1   Generalized weakness secondary to severe hypokalemia of less than 2, UTI -PT>>SNF   Hypothyroidism TSH elevated at 29.7 today, was WNL-just a month ago at 0.5.  Elevated TSH in the setting of acute illness. Doubt this level of TSH would contribute to encephalopathy.   -free T4 low at 0.54 -increase synthroid to 100 mcg   Dementia (HCC) Nursing home resident.  Ambulates with walker + assistance also.   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  She was also on letrozole therapy.   Class 2 obesity -BMI 35.33 -  lifestyle modification       Consultants: none Procedures performed: none   Disposition: Skilled nursing facility Diet recommendation:  Cardiac diet DISCHARGE MEDICATION: Allergies as of 07/28/2023       Reactions   Codeine Nausea Only        Medication List     STOP taking these medications    calcium carbonate 750 MG chewable tablet Commonly known as: TUMS EX   hydrochlorothiazide 25 MG tablet Commonly known as: HYDRODIURIL       TAKE these medications    acetaminophen 500 MG tablet Commonly known as: TYLENOL Take 500 mg by mouth every 8 (eight) hours as needed for mild pain or moderate pain.   CALCIUM 600 HIGH POTENCY PO Take 600 mg by mouth 2 (two) times daily.   [employer] SPECTRAVITE SENIOR PO Take 1 tablet by mouth daily.   donepezil 10 MG tablet Commonly known as: ARICEPT Take 10 mg by mouth daily.   Fish Oil 1200 MG Caps Take 1 capsule by mouth 3 (three) times daily.   fluticasone 50 MCG/ACT nasal spray Commonly known as: FLONASE Place 2 sprays into both nostrils daily.   ipratropium-albuterol 0.5-2.5 (3) MG/3ML Soln Commonly known as: DUONEB Inhale 3 mLs into the lungs 3 (three) times daily.   levothyroxine 75 MCG tablet Commonly known as: SYNTHROID Take 75 mcg by mouth daily before breakfast.   loratadine 10 MG tablet Commonly known as: CLARITIN Take 10 mg by mouth daily.   simvastatin 40 MG tablet Commonly known as: ZOCOR Take 40 mg by mouth at bedtime.   torsemide 20 MG tablet Commonly known as: DEMADEX Take 60 mg by mouth daily.   Vitamin D3 25 MCG (1000 UT) Caps Take 2,000 Units by mouth daily.        Contact information for after-discharge care     Destination     [facility] .   Service: Skilled Nursing Contact information: [address] [place] [address] [phone]                    Discharge Exam: Ceasar Mons Weights   07/25/23 0441 07/25/23 0521 07/26/23 0521  Weight: 94.2 kg 89.4 kg 96.3 kg   HEENT:  [place]/AT, No thrush,  no icterus CV:  RRR, no rub, no S3, no S4 Lung:  fine bibasilar crackles.  No wheeze Abd:  soft/+BS, NT Ext:  Nonpitting LE edema, no lymphangitis, no synovitis, no rash   Condition at discharge: stable  The results of significant diagnostics from this hospitalization (including imaging, microbiology, ancillary and laboratory) are listed below for reference.   Imaging Studies: US RENAL  Result Date: 07/27/2023 CLINICAL DATA:  Inpatient, [phone] AKI (acute kidney injury) [facility]) [id number] Hydronephrosis of right kidney [id number] EXAM: RENAL / URINARY TRACT ULTRASOUND COMPLETE COMPARISON:  07/24/2023 renal sonogram FINDINGS: Right Kidney: Renal measurements: 13.0 x 5.8 x 5.8 cm = volume: 227 mL. Simple 1.7 x 1.3 x 1.7 cm upper right renal cyst. Echogenic right renal parenchyma, normal thickness. Mild right hydronephrosis, improved. Left Kidney: Renal measurements: 14.0 x 7.1 x 7.3 cm = volume: 378 mL. Echogenic left renal parenchyma, normal thickness. Simple 3.7 x 3.2 x 3.5 cm upper left renal cyst. Mild left hydronephrosis, improved. Bladder: Bladder completely collapsed by indwelling Foley catheter, precluding bladder assessment. Other: None. IMPRESSION: 1. Mild bilateral hydronephrosis, improved bilaterally since 07/24/2023 renal sonogram. 2. Bladder completely collapsed by indwelling Foley catheter, precluding bladder assessment. 3. Echogenic normal  size kidneys, compatible with reported nonspecific acute renal parenchymal disease. 4. Simple bilateral renal cysts, for which no follow-up imaging is recommended. Electronically Signed   By: Delbert Phenix M.D.   On: 07/27/2023 15:38   US RENAL  Result Date: 07/24/2023 CLINICAL DATA:  Acute kidney injury. EXAM: RENAL / URINARY TRACT ULTRASOUND COMPLETE COMPARISON:  None Available. FINDINGS: Right Kidney: Renal measurements: 11.3 x 6.9 x 6.8 cm = volume: 276 mL. Echogenicity within normal limits. Moderate hydronephrosis is noted. 1.8 cm exophytic cyst is  seen from midpole. Left Kidney: Renal measurements: 13.0 x 8.1 x 7.3 cm = volume: 401 mL. Two simple cysts are noted, the largest measuring 3.8 cm in upper pole. Echogenicity within normal limits. No mass or hydronephrosis visualized. Bladder: Calculated volume of 1981 mL.  Ureteral jets are not visualized. Other: None. IMPRESSION: Moderate right hydronephrosis.  Bilateral renal cysts. Electronically Signed   By: Lupita Raider M.D.   On: 07/24/2023 13:47   DG Chest Port 1 View  Result Date: 07/23/2023 CLINICAL DATA:  Altered mental status. EXAM: PORTABLE CHEST 1 VIEW COMPARISON:  08/22/2010. FINDINGS: Elevated right hemidiaphragm. Clear lungs. Normal heart size. No mediastinal widening. No pleural effusion or pneumothorax. IMPRESSION: No evidence of acute cardiopulmonary disease. Elevated right hemidiaphragm. Electronically Signed   By: Orvan Falconer M.D.   On: 07/23/2023 17:11   MR BRAIN WO CONTRAST  Result Date: 07/23/2023 CLINICAL DATA:  Neuro deficit, acute, stroke suspected. Left-sided facial droop and weakness. EXAM: MRI HEAD WITHOUT CONTRAST TECHNIQUE: Multiplanar, multiecho pulse sequences of the brain and surrounding structures were obtained without intravenous contrast. COMPARISON:  Head CT earlier same day. FINDINGS: Brain: Diffusion imaging does not show any acute or subacute infarction. Mild chronic small-vessel ischemic change affects the pons. No focal cerebellar insult. Cerebral hemispheres show moderate chronic small-vessel ischemic changes of the white matter and the right basal ganglia. No large vessel territory stroke. No mass, hemorrhage, hydrocephalus or extra-axial collection. Vascular: Major vessels at the base of the brain show flow. Skull and upper cervical spine: Negative Sinuses/Orbits: Clear/normal Other: Tiny amount of mastoid fluid on the right, probably not significant. IMPRESSION: No acute finding. Moderate chronic small-vessel ischemic changes of the pons and cerebral  hemispheric white matter. Electronically Signed   By: Paulina Fusi M.D.   On: 07/23/2023 15:08   CT HEAD CODE STROKE WO CONTRAST  Result Date: 07/23/2023 CLINICAL DATA:  Code stroke. Neuro deficit, acute, stroke suspected. Left-sided facial droop and weakness. EXAM: CT HEAD WITHOUT CONTRAST TECHNIQUE: Contiguous axial images were obtained from the base of the skull through the vertex without intravenous contrast. RADIATION DOSE REDUCTION: This exam was performed according to the departmental dose-optimization program which includes automated exposure control, adjustment of the mA and/or kV according to patient size and/or use of iterative reconstruction technique. COMPARISON:  Head CT 05/28/2023. FINDINGS: Brain: No acute hemorrhage. Unchanged moderate chronic small-vessel disease. Cortical gray-white differentiation is otherwise preserved. Prominence of the ventricles and sulci within expected range for age. No hydrocephalus or extra-axial collection. No mass effect or midline shift. Vascular: No hyperdense vessel or unexpected calcification. Skull: No calvarial fracture or suspicious bone lesion. Skull base is unremarkable. Sinuses/Orbits: No acute finding. Other: None. ASPECTS [facility] Stroke Program Early CT Score) - Ganglionic level infarction (caudate, lentiform nuclei, internal capsule, insula, M1-M3 cortex): 7 - Supraganglionic infarction (M4-M6 cortex): 3 Total score (0-10 with 10 being normal): 10 IMPRESSION: 1. No acute intracranial hemorrhage or evidence of acute large vessel territory infarct. ASPECT  score is 10. 2. Stable moderate chronic small-vessel disease. Code stroke imaging results were communicated on 07/23/2023 at 2:30 pm to provider Dr. Audrie Lia via telephone, who verbally acknowledged these results. Electronically Signed   By: Orvan Falconer M.D.   On: 07/23/2023 14:30    Microbiology: Results for orders placed or performed during the hospital encounter of 07/23/23  MRSA Next Gen by PCR,  Nasal     Status: None   Collection Time: 07/23/23  9:08 PM   Specimen: Nasal Mucosa; Nasal Swab  Result Value Ref Range Status   MRSA by PCR Next Gen NOT DETECTED NOT DETECTED Final    Comment: (NOTE) The GeneXpert MRSA Assay (FDA approved for NASAL specimens only), is one component of a comprehensive MRSA colonization surveillance program. It is not intended to diagnose MRSA infection nor to guide or monitor treatment for MRSA infections. Test performance is not FDA approved in patients less than 73 years old. Performed at [facility], [address] [place], [place] [address]   Urine Culture (for pregnant, neutropenic or urologic patients or patients with an indwelling urinary catheter)     Status: Abnormal   Collection Time: 07/24/23  9:32 AM   Specimen: Urine, Catheterized  Result Value Ref Range Status   Specimen Description   Final    URINE, CATHETERIZED Performed at [facility], [address], [place], [place] [address]    Special Requests   Final    NONE Performed at [facility], [address] [place], [place] [address]    Culture (A)  Final    90,000 COLONIES/mL KLEBSIELLA PNEUMONIAE Confirmed Extended Spectrum Beta-Lactamase Producer (ESBL).  In bloodstream infections from ESBL organisms, carbapenems are preferred over piperacillin/tazobactam. They are shown to have a lower risk of mortality.    Report Status 07/26/2023 FINAL  Final   Organism ID, Bacteria KLEBSIELLA PNEUMONIAE (A)  Final      Susceptibility   Klebsiella pneumoniae - MIC*    AMPICILLIN >=32 RESISTANT Resistant     CEFAZOLIN >=64 RESISTANT Resistant     CEFEPIME 2 SENSITIVE Sensitive     CEFTRIAXONE >=64 RESISTANT Resistant     CIPROFLOXACIN 0.5 INTERMEDIATE Intermediate     GENTAMICIN <=1 SENSITIVE Sensitive     IMIPENEM <=0.25 SENSITIVE Sensitive     NITROFURANTOIN 128 RESISTANT Resistant     TRIMETH/SULFA >=320 RESISTANT Resistant     AMPICILLIN/SULBACTAM 8 SENSITIVE Sensitive     PIP/TAZO  <=4 SENSITIVE Sensitive     * 90,000 COLONIES/mL KLEBSIELLA PNEUMONIAE    Labs: CBC: Recent Labs  Lab 07/23/23 1537 07/24/23 0518 07/25/23 0500 07/26/23 0358 07/27/23 0651 07/28/23 0359  WBC 18.0* 17.2* 19.5* 12.7* 10.8* 10.1  NEUTROABS 16.2*  --   --   --   --   --   HGB 8.7* 7.8* 8.0* 7.0* 6.9* 8.5*  HCT 26.9* 23.9* 24.6* 22.3* 22.4* 28.4*  MCV 92.8 90.5 91.4 94.1 96.1 95.3  PLT 242 357 379 125* 311 340   Basic Metabolic Panel: Recent Labs  Lab 07/23/23 1904 07/24/23 0518 07/25/23 0500 07/26/23 0358 07/27/23 0536 07/28/23 0359  NA  --  134* 139 144 142 143  K  --  2.6* 2.7* 3.3* 4.1 4.5  CL  --  91* 97* 106 109 108  CO2  --  27 29 30 28 30   GLUCOSE  --  112* 126* 117* 119* 92  BUN  --  97* 88* 64* 44* 36*  CREATININE  --  2.80* 2.07* 1.37*  1.04* 0.95  CALCIUM  --  7.5* 7.7* 7.5* 7.5* 7.7*  MG 1.7  --  2.0 1.7 2.0 2.1   Liver Function Tests: Recent Labs  Lab 07/23/23 1537 07/26/23 0358  AST 57* 41  ALT 42 33  ALKPHOS 107 68  BILITOT 0.4 0.3  PROT 6.0* 4.5*  ALBUMIN 2.0* <2.0*   CBG: Recent Labs  Lab 07/24/23 1625 07/24/23 2059 07/25/23 0723 07/25/23 1124 07/25/23 1634  GLUCAP 108* 113* 116* 190* 167*    Discharge time spent: greater than 30 minutes.  Signed: Catarina Hartshorn, MD [facility] 07/28/2023

## 2023-07-28 NOTE — TOC Transition Note (Signed)
Transition of Care [facility]) - CM/SW Discharge Note   Patient Details  Name: Deanna Henderson MRN: [id number] Date of Birth: 05-27-1951  Transition of Care [facility] [place] [facility]) CM/SW Contact:  Annice Needy, LCSW Phone Number: 07/28/2023, 1:46 PM   Clinical Narrative:    Sister notified of d/c. D/c clinicals sent to the facility. [facility] to provide transport. Nurse called report. TOC signing off.    Final next level of care: Skilled Nursing Facility Barriers to Discharge: Continued Medical Work up   Patient Goals and CMS Choice      Discharge Placement                         Discharge Plan and Services Additional resources added to the After Visit Summary for                                       Social Determinants of Health (SDOH) Interventions SDOH Screenings   Food Insecurity: No Food Insecurity (07/26/2023)  Housing: Low Risk  (07/26/2023)  Recent Concern: Housing - Medium Risk (05/29/2023)  Transportation Needs: No Transportation Needs (07/26/2023)  Utilities: Not At Risk (07/26/2023)  Alcohol Screen: Low Risk  (11/16/2020)  Depression (PHQ2-9): Low Risk  (11/16/2020)  Financial Resource Strain: Low Risk  (11/16/2020)  Physical Activity: Insufficiently Active (11/16/2020)  Social Connections: Moderately Integrated (11/16/2020)  Stress: No Stress Concern Present (11/16/2020)  Tobacco Use: Low Risk  (07/23/2023)     Readmission Risk Interventions     No data to display

## 2023-07-28 NOTE — Evaluation (Signed)
Physical Therapy Evaluation Patient Details Name: Deanna Henderson MRN: [id number] DOB: [birth year] Today's Date: 07/28/2023  History of Present Illness  year-old female with a history of dementia, obesity, frequent UTIs, breast cancer, hypothyroidism presenting from [facility] secondary to altered mental status and generalized weakness.  Apparently, the patient was working with physical therapy, and there was some concern for some subtle left-sided facial weakness and upper extremity weakness.  As result, code stroke was activated.  Initial imaging with a CT brain and MRI brain were negative.  The patient was evaluated by teleneurology.  TNK was deferred secondary to nonfocal exam and patient was felt to be a poor candidate by a modified Rankin scale.. Dr. Wilford Corner felt that her presentation was more consistent with metabolic encephalopathy.  Further history revealed that the patient has had increasing generalized weakness for at least the last 2 days prior to admission.   Clinical Impression  Pt tolerated treatment well. Pt pressented on 2L of O2 via [place]. Pt states she does not use O2 at baseline so pt was taken off and continued session on room air with SpO2 measuring at 100% throughout activity. Participation in ambulation largely limited due to LE weakness and fatigue. Pt demonstrates difficulty advancing feet to intended point. Patient will benefit from continued skilled physical therapy in hospital and recommended venue below to increase strength, balance, endurance for safe ADLs and gait.       If plan is discharge home, recommend the following: A lot of help with walking and/or transfers;A lot of help with bathing/dressing/bathroom;Assistance with cooking/housework;Assist for transportation;Help with stairs or ramp for entrance;Assistance with feeding   Can travel by private vehicle        Equipment Recommendations    Recommendations for Other Services       Functional Status Assessment  Patient has had a recent decline in their functional status and demonstrates the ability to make significant improvements in function in a reasonable and predictable amount of time.     Precautions / Restrictions Precautions Precautions: Fall Restrictions Weight Bearing Restrictions: No      Mobility  Bed Mobility Overal bed mobility: Needs Assistance Bed Mobility: Sidelying to Sit, Supine to Sit   Sidelying to sit: Mod assist Supine to sit: Mod assist     General bed mobility comments: Required assistance moving to EOB    Transfers Overall transfer level: Needs assistance Equipment used: Rolling walker (2 wheels) Transfers: Sit to/from Stand, Bed to chair/wheelchair/BSC Sit to Stand: Mod assist   Step pivot transfers: Mod assist       General transfer comment: RW    Ambulation/Gait Ambulation/Gait assistance: Mod assist Gait Distance (Feet): 4 Feet (Took steps at bedside when trasnferring from bed to chair) Assistive device: Rolling walker (2 wheels) Gait Pattern/deviations: Step-to pattern, Decreased step length - right, Decreased step length - left, Decreased stride length Gait velocity: slow     General Gait Details: Labored with use of RW, trouble advancing feet  Stairs            Wheelchair Mobility     Tilt Bed    Modified Rankin (Stroke Patients Only)       Balance Overall balance assessment: Needs assistance Sitting-balance support: Bilateral upper extremity supported, Feet supported Sitting balance-Leahy Scale: Fair Sitting balance - Comments: seated EOB Postural control: Left lateral lean Standing balance support: Bilateral upper extremity supported, During functional activity, Reliant on assistive device for balance Standing balance-Leahy Scale: Poor Standing balance comment: use of RW  Pertinent Vitals/Pain Pain Assessment Pain Assessment: No/denies pain    Home Living Family/patient  expects to be discharged to:: Private residence Living Arrangements: Alone Available Help at Discharge: Personal care attendant;Friend(s);Available PRN/intermittently Type of Home: Apartment Home Access: Level entry       Home Layout: One level Home Equipment: Agricultural consultant (2 wheels);Cane - quad;Tub bench;Grab bars - tub/shower      Prior Function Prior Level of Function : Needs assist       Physical Assist : Mobility (physical);ADLs (physical) Mobility (physical): Bed mobility;Transfers;Gait;Stairs ADLs (physical): Grooming;Bathing;Feeding;Dressing;Toileting;IADLs Mobility Comments: household distances using quad-cane, recently having to use RW ADLs Comments: Home aides half a day x 5 days/week, friend helps     Extremity/Trunk Assessment                Communication   Communication Communication: No apparent difficulties Cueing Techniques: Verbal cues;Tactile cues  Cognition Arousal: Alert Behavior During Therapy: WFL for tasks assessed/performed Overall Cognitive Status: Within Functional Limits for tasks assessed                                          General Comments      Exercises     Assessment/Plan    PT Assessment Patient needs continued PT services  PT Problem List Decreased strength;Decreased activity tolerance;Decreased balance;Decreased mobility;Decreased coordination       PT Treatment Interventions DME instruction;Gait training;Stair training;Functional mobility training;Therapeutic activities;Therapeutic exercise;Balance training    PT Goals (Current goals can be found in the Care Plan section)  Acute Rehab PT Goals Patient Stated Goal: Return home to assist of aids and friends PT Goal Formulation: With patient Time For Goal Achievement: 08/11/23 Potential to Achieve Goals: Good    Frequency Min 3X/week     Co-evaluation               AM-PAC PT "6 Clicks" Mobility  Outcome Measure Help needed turning  from your back to your side while in a flat bed without using bedrails?: A Lot Help needed moving from lying on your back to sitting on the side of a flat bed without using bedrails?: A Lot Help needed moving to and from a bed to a chair (including a wheelchair)?: A Lot Help needed standing up from a chair using your arms (e.g., wheelchair or bedside chair)?: A Lot Help needed to walk in hospital room?: A Lot Help needed climbing 3-5 steps with a railing? : Total 6 Click Score: 11    End of Session Equipment Utilized During Treatment: Gait belt Activity Tolerance: Patient limited by fatigue Patient left: in chair;with call bell/phone within reach Nurse Communication: Mobility status PT Visit Diagnosis: Unsteadiness on feet (R26.81);Other abnormalities of gait and mobility (R26.89);Muscle weakness (generalized) (M62.81);History of falling (Z91.81)    Time: 6063-0160 PT Time Calculation (min) (ACUTE ONLY): 23 min   Charges:   PT Evaluation $PT Eval Moderate Complexity: 1 Mod PT Treatments $Therapeutic Activity: 23-37 mins PT General Charges $$ ACUTE PT VISIT: 1 Visit           SPT [place], DPT Program

## 2023-11-20 ENCOUNTER — Ambulatory Visit (HOSPITAL_COMMUNITY)
Admission: RE | Admit: 2023-11-20 | Discharge: 2023-11-20 | Disposition: A | Discharge status: Home / Self Care | Payer: Medicare Other | Source: Ambulatory Visit | Attending: Hematology | Admitting: Hematology

## 2023-11-20 ENCOUNTER — Encounter (HOSPITAL_COMMUNITY): Payer: Self-pay

## 2023-11-20 ENCOUNTER — Other Ambulatory Visit: Payer: Self-pay

## 2023-11-20 ENCOUNTER — Emergency Department (HOSPITAL_COMMUNITY)
Admission: EM | Admit: 2023-11-20 | Discharge: 2023-11-21 | Disposition: A | Discharge status: Other Acute Inpatient Hospital | Payer: Medicare Other | Attending: Emergency Medicine | Admitting: Emergency Medicine

## 2023-11-20 ENCOUNTER — Inpatient Hospital Stay: Payer: Medicare Other

## 2023-11-20 ENCOUNTER — Inpatient Hospital Stay: Payer: Medicare Other | Attending: Hematology | Admitting: Hematology

## 2023-11-20 DIAGNOSIS — Z79890 Hormone replacement therapy (postmenopausal): Secondary | ICD-10-CM | POA: Diagnosis not present

## 2023-11-20 DIAGNOSIS — M8580 Other specified disorders of bone density and structure, unspecified site: Secondary | ICD-10-CM | POA: Insufficient documentation

## 2023-11-20 DIAGNOSIS — Z1231 Encounter for screening mammogram for malignant neoplasm of breast: Secondary | ICD-10-CM | POA: Diagnosis present

## 2023-11-20 DIAGNOSIS — Z79811 Long term (current) use of aromatase inhibitors: Secondary | ICD-10-CM | POA: Insufficient documentation

## 2023-11-20 DIAGNOSIS — Z79899 Other long term (current) drug therapy: Secondary | ICD-10-CM | POA: Diagnosis not present

## 2023-11-20 DIAGNOSIS — Z170 Estrogen receptor positive status [ER+]: Secondary | ICD-10-CM | POA: Insufficient documentation

## 2023-11-20 DIAGNOSIS — Z853 Personal history of malignant neoplasm of breast: Secondary | ICD-10-CM | POA: Diagnosis not present

## 2023-11-20 DIAGNOSIS — F0390 Unspecified dementia without behavioral disturbance: Secondary | ICD-10-CM | POA: Insufficient documentation

## 2023-11-20 DIAGNOSIS — Z923 Personal history of irradiation: Secondary | ICD-10-CM | POA: Insufficient documentation

## 2023-11-20 DIAGNOSIS — E559 Vitamin D deficiency, unspecified: Secondary | ICD-10-CM | POA: Diagnosis not present

## 2023-11-20 DIAGNOSIS — R739 Hyperglycemia, unspecified: Secondary | ICD-10-CM | POA: Insufficient documentation

## 2023-11-20 DIAGNOSIS — C50912 Malignant neoplasm of unspecified site of left female breast: Secondary | ICD-10-CM

## 2023-11-20 DIAGNOSIS — I4510 Unspecified right bundle-branch block: Secondary | ICD-10-CM | POA: Diagnosis not present

## 2023-11-20 DIAGNOSIS — E876 Hypokalemia: Secondary | ICD-10-CM | POA: Diagnosis present

## 2023-11-20 DIAGNOSIS — Z9012 Acquired absence of left breast and nipple: Secondary | ICD-10-CM | POA: Insufficient documentation

## 2023-11-20 DIAGNOSIS — M810 Age-related osteoporosis without current pathological fracture: Secondary | ICD-10-CM | POA: Insufficient documentation

## 2023-11-20 LAB — COMPREHENSIVE METABOLIC PANEL
ALT: 10 U/L (ref 0–44)
AST: 17 U/L (ref 15–41)
Albumin: 3.1 g/dL — ABNORMAL LOW (ref 3.5–5.0)
Alkaline Phosphatase: 49 U/L (ref 38–126)
Anion gap: 12 (ref 5–15)
BUN: 25 mg/dL — ABNORMAL HIGH (ref 8–23)
CO2: 32 mmol/L (ref 22–32)
Calcium: 9 mg/dL (ref 8.9–10.3)
Chloride: 91 mmol/L — ABNORMAL LOW (ref 98–111)
Creatinine, Ser: 1.13 mg/dL — ABNORMAL HIGH (ref 0.44–1.00)
GFR, Estimated: 51 mL/min — ABNORMAL LOW (ref >60)
Glucose, Bld: 120 mg/dL — ABNORMAL HIGH (ref 70–99)
Potassium: 2.4 mmol/L — CL (ref 3.5–5.1)
Sodium: 135 mmol/L (ref 135–145)
Total Bilirubin: 0.5 mg/dL (ref <1.2)
Total Protein: 6.9 g/dL (ref 6.5–8.1)

## 2023-11-20 LAB — CBC WITH DIFFERENTIAL/PLATELET
Abs Immature Granulocytes: 0.04 10*3/uL (ref 0.00–0.07)
Abs Immature Granulocytes: 0.03 10*3/uL (ref 0.00–0.07)
Basophils Absolute: 0 10*3/uL (ref 0.0–0.1)
Basophils Absolute: 0 10*3/uL (ref 0.0–0.1)
Basophils Relative: 0 %
Basophils Relative: 0 %
Eosinophils Absolute: 0.2 10*3/uL (ref 0.0–0.5)
Eosinophils Absolute: 0.2 10*3/uL (ref 0.0–0.5)
Eosinophils Relative: 2 %
Eosinophils Relative: 2 %
HCT: 35.5 % — ABNORMAL LOW (ref 36.0–46.0)
HCT: 39.3 % (ref 36.0–46.0)
Hemoglobin: 11.4 g/dL — ABNORMAL LOW (ref 12.0–15.0)
Hemoglobin: 12.6 g/dL (ref 12.0–15.0)
Immature Granulocytes: 0 %
Immature Granulocytes: 0 %
Lymphocytes Relative: 13 %
Lymphocytes Relative: 19 %
Lymphs Abs: 1.2 10*3/uL (ref 0.7–4.0)
Lymphs Abs: 1.8 10*3/uL (ref 0.7–4.0)
MCH: 29.5 pg (ref 26.0–34.0)
MCH: 29.6 pg (ref 26.0–34.0)
MCHC: 32.1 g/dL (ref 30.0–36.0)
MCHC: 32.1 g/dL (ref 30.0–36.0)
MCV: 92 fL (ref 80.0–100.0)
MCV: 92.5 fL (ref 80.0–100.0)
Monocytes Absolute: 0.5 10*3/uL (ref 0.1–1.0)
Monocytes Absolute: 0.4 10*3/uL (ref 0.1–1.0)
Monocytes Relative: 5 %
Monocytes Relative: 4 %
Neutro Abs: 7.7 10*3/uL (ref 1.7–7.7)
Neutro Abs: 7.1 10*3/uL (ref 1.7–7.7)
Neutrophils Relative %: 80 %
Neutrophils Relative %: 75 %
Platelets: 344 10*3/uL (ref 150–400)
Platelets: 325 10*3/uL (ref 150–400)
RBC: 3.86 MIL/uL — ABNORMAL LOW (ref 3.87–5.11)
RBC: 4.25 MIL/uL (ref 3.87–5.11)
RDW: 13.3 % (ref 11.5–15.5)
RDW: 13.4 % (ref 11.5–15.5)
WBC: 9.6 10*3/uL (ref 4.0–10.5)
WBC: 9.5 10*3/uL (ref 4.0–10.5)
nRBC: 0 % (ref 0.0–0.2)
nRBC: 0 % (ref 0.0–0.2)

## 2023-11-20 LAB — BASIC METABOLIC PANEL
Anion gap: 11 (ref 5–15)
BUN: 23 mg/dL (ref 8–23)
CO2: 29 mmol/L (ref 22–32)
Calcium: 9.2 mg/dL (ref 8.9–10.3)
Chloride: 95 mmol/L — ABNORMAL LOW (ref 98–111)
Creatinine, Ser: 1.03 mg/dL — ABNORMAL HIGH (ref 0.44–1.00)
GFR, Estimated: 57 mL/min — ABNORMAL LOW (ref >60)
Glucose, Bld: 108 mg/dL — ABNORMAL HIGH (ref 70–99)
Potassium: 3 mmol/L — ABNORMAL LOW (ref 3.5–5.1)
Sodium: 135 mmol/L (ref 135–145)

## 2023-11-20 LAB — VITAMIN D 25 HYDROXY (VIT D DEFICIENCY, FRACTURES): Vit D, 25-Hydroxy: 74.06 ng/mL (ref 30–100)

## 2023-11-20 LAB — MAGNESIUM: Magnesium: 1.9 mg/dL (ref 1.7–2.4)

## 2023-11-20 MED ORDER — SODIUM CHLORIDE 0.9 % IV SOLN: INTRAVENOUS | Status: DC | PRN

## 2023-11-20 MED ORDER — POTASSIUM CHLORIDE 10 MEQ/100ML IV SOLN
10.0000 meq | INTRAVENOUS | Status: AC
Start: 2023-11-20 — End: 2023-11-20
  Administered 2023-11-20 (×2): 10 meq via INTRAVENOUS
  Filled 2023-11-20 (×2): qty 100

## 2023-11-20 MED ORDER — POTASSIUM CHLORIDE CRYS ER 20 MEQ PO TBCR
40.0000 meq | EXTENDED_RELEASE_TABLET | Freq: Once | ORAL | Status: AC
  Administered 2023-11-20: 40 meq via ORAL
  Filled 2023-11-20: qty 2

## 2023-11-20 MED ORDER — POTASSIUM CHLORIDE CRYS ER 20 MEQ PO TBCR
20.0000 meq | EXTENDED_RELEASE_TABLET | Freq: Once | ORAL | Status: AC
  Administered 2023-11-20: 20 meq via ORAL
  Filled 2023-11-20: qty 1

## 2023-11-20 MED ORDER — POTASSIUM CHLORIDE 10 MEQ/100ML IV SOLN: 10.0000 meq | INTRAVENOUS | Status: DC

## 2023-11-20 MED ORDER — POTASSIUM CHLORIDE CRYS ER 20 MEQ PO TBCR: EXTENDED_RELEASE_TABLET | ORAL | 0 refills | Status: AC

## 2023-11-20 MED ORDER — POTASSIUM CHLORIDE 10 MEQ/100ML IV SOLN
10.0000 meq | Freq: Once | INTRAVENOUS | Status: AC
Start: 2023-11-20 — End: 2023-11-20
  Administered 2023-11-20: 10 meq via INTRAVENOUS
  Filled 2023-11-20: qty 100

## 2023-11-20 MED ORDER — SPIRONOLACTONE 12.5 MG HALF TABLET
12.5000 mg | ORAL_TABLET | Freq: Every day | ORAL | Status: DC
  Administered 2023-11-20: 12.5 mg via ORAL
  Filled 2023-11-20: qty 1

## 2023-11-20 MED ORDER — POTASSIUM CHLORIDE CRYS ER 20 MEQ PO TBCR: 40.0000 meq | EXTENDED_RELEASE_TABLET | Freq: Once | ORAL | Status: DC

## 2023-11-20 NOTE — Discharge Instructions (Addendum)
Patient will need to take potassium as directed.  She will need to follow-up with her primary care provider in 3 days for recheck of her potassium level.  Return to the emergency department for any new or worsening symptoms.

## 2023-11-20 NOTE — ED Provider Notes (Signed)
[place] EMERGENCY DEPARTMENT AT [facility] Provider Note   CSN: [id number] Arrival date & time: 11/20/23  1346     History  Chief Complaint  Patient presents with   abnormal labs    Deanna Henderson is a 72 y.o. female with a past medical history significant for dementia and breast cancer who presents to the ED due to hypokalemia.  Patient had lab work performed earlier today where her potassium was found to be 2.4.  Patient denies any nausea, vomiting, or diarrhea.  Is currently on a diuretic.  History of dementia so HPI is limited.  She notes she was recently treated for COVID and pneumonia.  Has finished treatment. No chest pain or shortness of breath. Denies fever and chills.   History obtained from patient and past medical records. No interpreter used during encounter.       Home Medications Prior to Admission medications   Medication Sig Start Date End Date Taking? Authorizing Provider  acetaminophen (TYLENOL) 500 MG tablet Take 500 mg by mouth every 8 (eight) hours as needed for mild pain or moderate pain.    [provider]  Calcium Carbonate (CALCIUM 600 HIGH POTENCY PO) Take 600 mg by mouth 2 (two) times daily.     [provider]  Cholecalciferol (VITAMIN D3) 1000 UNITS CAPS Take 2,000 Units by mouth daily.    [provider]  donepezil (ARICEPT) 10 MG tablet Take 10 mg by mouth daily.      [provider]  fluticasone (FLONASE) 50 MCG/ACT nasal spray Place 2 sprays into both nostrils daily.    [provider]  ipratropium-albuterol (DUONEB) 0.5-2.5 (3) MG/3ML SOLN Inhale 3 mLs into the lungs 3 (three) times daily.    [provider]  levothyroxine (SYNTHROID) 100 MCG tablet Take 1 tablet (100 mcg total) by mouth daily before breakfast. 07/29/23   Tat, Onalee Hua, MD  loratadine (CLARITIN) 10 MG tablet Take 10 mg by mouth daily. 10/22/18   [provider]  Multiple Vitamins-Minerals ([employer] SPECTRAVITE  SENIOR PO) Take 1 tablet by mouth daily.    [provider]  Omega-3 Fatty Acids (FISH OIL) 1200 MG CAPS Take 1 capsule by mouth 3 (three) times daily.    [provider]  simvastatin (ZOCOR) 40 MG tablet Take 40 mg by mouth at bedtime.      [provider]  torsemide (DEMADEX) 20 MG tablet Take 60 mg by mouth daily.    [provider]      Allergies    Codeine    Review of Systems   Review of Systems  Unable to perform ROS: Dementia    Physical Exam Updated Vital Signs BP 114/68   Pulse 74   Temp 98.6 F (37 C) (Oral)   Resp 15   Ht 5\' 5"  (1.651 m)   Wt 80.3 kg   SpO2 99%   BMI 29.45 kg/m  Physical Exam Vitals and nursing note reviewed.  Constitutional:      General: She is not in acute distress.    Appearance: She is not ill-appearing.  HENT:     Head: Normocephalic.  Eyes:     Pupils: Pupils are equal, round, and reactive to light.  Cardiovascular:     Rate and Rhythm: Normal rate and regular rhythm.     Pulses: Normal pulses.     Heart sounds: Normal heart sounds. No murmur heard.    No friction rub. No gallop.  Pulmonary:  Effort: Pulmonary effort is normal.     Breath sounds: Normal breath sounds.  Abdominal:     General: Abdomen is flat. There is no distension.     Palpations: Abdomen is soft.     Tenderness: There is no abdominal tenderness. There is no guarding or rebound.  Musculoskeletal:        General: Normal range of motion.     Cervical back: Neck supple.  Skin:    General: Skin is warm and dry.  Neurological:     General: No focal deficit present.     Mental Status: She is alert.  Psychiatric:        Mood and Affect: Mood normal.        Behavior: Behavior normal.     ED Results / Procedures / Treatments   Labs (all labs ordered are listed, but only abnormal results are displayed) Labs Reviewed  COMPREHENSIVE METABOLIC PANEL - Abnormal; Notable for the following components:      Result Value    Potassium 2.6 (*)    Chloride 90 (*)    Glucose, Bld 146 (*)    BUN 24 (*)    Creatinine, Ser 1.19 (*)    Albumin 3.4 (*)    GFR, Estimated 48 (*)    All other components within normal limits  CBC WITH DIFFERENTIAL/PLATELET  MAGNESIUM    EKG EKG Interpretation Date/Time:  Thursday November 20 2023 14:17:53 EST Ventricular Rate:  81 PR Interval:  56 QRS Duration:  154 QT Interval:  447 QTC Calculation: 519 R Axis:   26  Text Interpretation: Sinus rhythm Short PR interval Consider right atrial enlargement Right bundle branch block Confirmed by Pricilla Loveless [phone]) on 11/20/2023 3:00:15 PM  Radiology No results found.  Procedures Procedures    Medications Ordered in ED Medications  0.9 %  sodium chloride infusion ( Intravenous New Bag/Given 11/20/23 1510)  potassium chloride 10 mEq in 100 mL IVPB (10 mEq Intravenous New Bag/Given 11/20/23 1757)  spironolactone (ALDACTONE) tablet 12.5 mg (12.5 mg Oral Given 11/20/23 1753)  potassium chloride SA (KLOR-CON M) CR tablet 40 mEq (40 mEq Oral Given 11/20/23 1513)  potassium chloride 10 mEq in 100 mL IVPB (0 mEq Intravenous Stopped 11/20/23 1727)  potassium chloride SA (KLOR-CON M) CR tablet 20 mEq (20 mEq Oral Given 11/20/23 1753)    ED Course/ Medical Decision Making/ A&P Clinical Course as of 11/20/23 1801  Thu Nov 20, 2023  1533 Attempted to call [facility] with no answer. Will attempt again. [CA]  1607 Attempted to call living facility again with no answer [CA]    Clinical Course User Index [CA] Mannie Stabile, PA-C                                 Medical Decision Making Amount and/or Complexity of Data Reviewed Independent Historian: EMS External Data Reviewed: notes. Labs: ordered. Decision-making details documented in ED Course. ECG/medicine tests: ordered and independent interpretation performed. Decision-making details documented in ED Course.  Risk Prescription drug management. Decision regarding  hospitalization.   This patient presents to the ED for concern of hypokalemia, this involves an extensive number of treatment options, and is a complaint that carries with it a high risk of complications and morbidity.  The differential diagnosis includes electrolyte abnormalities, dehydration, etc  72 year old female presents to the ED from [facility] due to hypokalemia.  Patient had labs obtained earlier today  and was found to have a potassium of 2.4.  Patient has a history of dementia so history is limited however, patient denies any nausea, vomiting, or diarrhea.  Currently on a diuretic.  He was history of hypokalemia.  Not currently on any potassium supplements.  Upon arrival, patient afebrile, not tachycardic or hypoxic.  Patient in no acute distress.  Reassuring physical exam.  Routine labs ordered to recheck potassium.  Added magnesium.  CBC unremarkable.  No leukocytosis.  Normal hemoglobin.  Normal magnesium.  CMP significant for hypokalemia 2.6.  Elevated creatinine 1.1 and BUN at 24.  Hyperglycemia at 146.  No anion gap.  Low suspicion for DKA. EKG with slightly prolonged Qtc.  6:01 PM Discussed with Dr. Randol Kern with [facility] who will consult on patient. More po and IV potassium ordered. Will recheck BMP in 1 hour. If potassium improves to 3, patient can likely go home.  Patient handed off to [facility], PA-C at shift change pending repeat BMP once potassium is finished. If potassium is 3 or above, patient may be discharged home with PCP follow-up.        Final Clinical Impression(s) / ED Diagnoses Final diagnoses:  Hypokalemia    Rx / DC Orders ED Discharge Orders     None         Jesusita Oka 11/20/23 1905    Terrilee Files, MD 11/21/23 1012

## 2023-11-20 NOTE — ED Provider Notes (Signed)
   Signed out to me by Claudette Stapler, PA-C pending IV potassium and repeat BMP  Patient sent to the emergency department from [facility] for evaluation of hypokalemia.  She had lab work earlier today and was found to have potassium of 2.4.  Patient does have a history of dementia at baseline, unable to provide significant history.  The previous provider note for complete H&P  Patient signed out to me awaiting repeat BMP for potassium level after receiving IV potassium and Aldactone here in the ED.  [facility] hospitalist was consulted and has reviewed pt's results.  Her repeat potassium improved to 3.0.  Hospitalist felt patient could be discharged back to the facility with 40 mEq potassium once a day for 5 days then 20 mEq thereafter.  Patient will need outpatient recheck of chemistries in 3 days.  Would also consider adding low-dose Aldactone if potassium remains low despite oral supplementation    Labs Reviewed  COMPREHENSIVE METABOLIC PANEL - Abnormal; Notable for the following components:      Result Value   Potassium 2.6 (*)    Chloride 90 (*)    Glucose, Bld 146 (*)    BUN 24 (*)    Creatinine, Ser 1.19 (*)    Albumin 3.4 (*)    GFR, Estimated 48 (*)    All other components within normal limits  BASIC METABOLIC PANEL - Abnormal; Notable for the following components:   Potassium 3.0 (*)    Chloride 95 (*)    Glucose, Bld 108 (*)    Creatinine, Ser 1.03 (*)    GFR, Estimated 57 (*)    All other components within normal limits  CBC WITH DIFFERENTIAL/PLATELET  MAGNESIUM      Pauline Aus, PA-C 11/20/23 2041    Terrilee Files, MD 11/21/23 1012

## 2023-11-20 NOTE — Progress Notes (Signed)
 CRITICAL VALUE ALERT Critical value received:  Potassium 2.4.  Date of notification:  11-20-2023 Time of notification: 11:20 am.  Critical value read back:  Yes.   Nurse who received alert:  B.Eirik Schueler RN.  MD notified time and response:  Dr. Ellin Saba @ 11:23 am.   Patient resides at [facility]. Patient unable to return to clinic by transporter for facility due to the driver was in [place] with another patient. Orders received from Dr. Ellin Saba for patient to present to ED for evaluation of 2.4 potassium.   [place] (nurse) at [facility] instructed to take the patient to the ED by EMS for potassium level. Understanding verbalized and [place] states they will arrange to get patient to the ED and take care of it. Understanding verbalized by B. Lianna Sitzmann RN and [place] Music therapist) at [facility].

## 2023-11-20 NOTE — ED Notes (Signed)
Family updated as to patient's status. Sister Colonel Bald

## 2023-11-20 NOTE — Consult Note (Signed)
Patient Demographics  Deanna Henderson, is a 72 y.o. 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 female, with a history of dementia, obesity, frequent UTIs, breast cancer, hypothyroidism presenting from [facility] secondary to hypokalemia, and has labs done earlier today, which did show a potassium level of 2.4, patient denies any complaints, no nausea, no vomiting, no diarrhea, her p.o. intake appropriate, has dementia, but she is to answer all her questions appropriately, he denies any fever, chills, chest pain, per records it does appear she was recently treated for COVID and pneumonia, in ED her repeat potassium was low at 2.6, she was on telemetry during ED stay, so far with no arrhythmias, her initial EKG showing QTc of 519, repeat after some replacement showing QTc of 501, last QTc was 509 during her admission last August, her  potassium was replaced in ED, she was able to tolerate her p.o. pills, as well she received 1 dose of Aldactone, hospitalist consulted to evaluate if patient need admission for her hypokalemia or if she can be discharged on p.o. supplements.    Review of Systems     A full 10 point Review of Systems was done, except as stated above, all other Review of Systems were negative.   Social History Social History   Tobacco Use   Smoking status: Never   Smokeless tobacco: Never  Substance Use Topics   Alcohol use: No    Family History Family History  Problem Relation Age of Onset   Cancer Mother    Cancer Father    Hypertension Sister    Diabetes Sister    Cancer Sister      Prior to Admission medications   Medication Sig Start Date End Date Taking? Authorizing Provider  acetaminophen (TYLENOL) 500 MG tablet Take 500 mg by mouth every 8 (eight)  hours as needed for mild pain or moderate pain.    [provider]  Calcium Carbonate (CALCIUM 600 HIGH POTENCY PO) Take 600 mg by mouth 2 (two) times daily.     [provider]  Cholecalciferol (VITAMIN D3) 1000 UNITS CAPS Take 2,000 Units by mouth daily.    [provider]  donepezil (ARICEPT) 10 MG tablet Take 10 mg by mouth daily.      [provider]  fluticasone (FLONASE) 50 MCG/ACT nasal spray Place 2 sprays into both nostrils daily.    [provider]  ipratropium-albuterol (DUONEB) 0.5-2.5 (3) MG/3ML SOLN Inhale 3 mLs into the lungs 3 (three) times daily.    [provider]  levothyroxine (SYNTHROID) 100 MCG tablet Take 1 tablet (100 mcg total) by mouth daily before breakfast. 07/29/23   Tat, Onalee Hua, MD  loratadine (CLARITIN) 10 MG tablet Take 10 mg by mouth daily. 10/22/18   [provider]  Multiple Vitamins-Minerals ([employer] SPECTRAVITE SENIOR PO) Take 1 tablet by mouth daily.    [provider]  Omega-3 Fatty Acids (FISH OIL) 1200 MG CAPS Take 1 capsule by mouth 3  (three) times daily.    [provider]  simvastatin (ZOCOR) 40 MG tablet Take 40 mg by mouth at bedtime.      [provider]  torsemide (DEMADEX) 20 MG tablet Take 60 mg by mouth daily.    [provider]    Anti-infectives (From admission, onward)    None       Scheduled Meds:  potassium chloride  20 mEq Oral Once   spironolactone  12.5 mg Oral Daily   Continuous Infusions:  sodium chloride 10 mL/hr at 11/20/23 1510   potassium chloride     PRN Meds:.sodium chloride  Allergies  Allergen Reactions   Codeine Nausea Only    Physical Exam  Vitals  Blood pressure 114/68, pulse 74, temperature 98.6 F (37 C), temperature source Oral, resp. rate 15, height 5\' 5"  (1.651 m), weight 80.3 kg, SpO2 99%.   1. General Elderly female, lying in bed in NAD,   2. N conversant, pleasant, appropriate, she is oriented x 3  3. No F.N deficits, ALL C.Nerves Intact, Strength 5/5 all 4 extremities, Sensation intact all 4 extremities, Plantars down going.  4. Ears and Eyes appear Normal, Conjunctivae clear, PERRLA. Moist Oral Mucosa.  5. Supple Neck, No JVD, No cervical lymphadenopathy appriciated, No Carotid Bruits.  6. Symmetrical Chest wall movement, Good air movement bilaterally, CTAB.  7. RRR, No Gallops, Rubs or Murmurs, No Parasternal Heave.  Trace lower extremity edema  8. Positive Bowel Sounds, Abdomen Soft, No tenderness, No organomegaly appriciated,No rebound -guarding or rigidity.  9.  No Cyanosis, Normal Skin Turgor, No Skin Rash or Bruise.  10. Good muscle tone,  joints appear normal , no effusions, Normal ROM.   Data Review  CBC Recent Labs  Lab 11/20/23 1039 11/20/23 1411  WBC 9.6 9.5  HGB 11.4* 12.6  HCT 35.5* 39.3  PLT 344 325  MCV 92.0 92.5  MCH 29.5 29.6  MCHC 32.1 32.1  RDW 13.3 13.4  LYMPHSABS 1.2 1.8  MONOABS 0.5 0.4  EOSABS 0.2 0.2  BASOSABS 0.0 0.0    ------------------------------------------------------------------------------------------------------------------  Chemistries  Recent Labs  Lab 11/20/23 1039 11/20/23 1411  NA 135 135  K 2.4* 2.6*  CL 91* 90*  CO2 32 30  GLUCOSE 120* 146*  BUN 25* 24*  CREATININE 1.13* 1.19*  CALCIUM 9.0 9.5  MG  --  1.9  AST 17 22  ALT 10 9  ALKPHOS 49 55  BILITOT 0.5 0.4   ------------------------------------------------------------------------------------------------------------------ estimated creatinine clearance is 43.4 mL/min (A) (by C-G formula based on SCr of 1.19 mg/dL (H)). ------------------------------------------------------------------------------------------------------------------ No results for input(s): "TSH", "T4TOTAL", "T3FREE", "THYROIDAB" in the last 72 hours.  Invalid input(s): "FREET3"   Coagulation profile No results for input(s): "INR", "PROTIME" in the last 168 hours. ------------------------------------------------------------------------------------------------------------------- No results for input(s): "DDIMER" in the last 72 hours. -------------------------------------------------------------------------------------------------------------------  Cardiac Enzymes No results for input(s): "CKMB", "TROPONINI", "MYOGLOBIN" in the last 168 hours.  Invalid input(s): "CK" ------------------------------------------------------------------------------------------------------------------ Invalid input(s): "POCBNP"   ---------------------------------------------------------------------------------------------------------------  Urinalysis    Component Value Date/Time   COLORURINE YELLOW 07/24/2023 0318   APPEARANCEUR CLOUDY (A) 07/24/2023 0318   LABSPEC 1.010 07/24/2023 0318   PHURINE 7.0 07/24/2023 0318   GLUCOSEU NEGATIVE 07/24/2023 0318   HGBUR LARGE (A) 07/24/2023 0318   BILIRUBINUR NEGATIVE 07/24/2023 0318   KETONESUR NEGATIVE 07/24/2023 0318    PROTEINUR 30 (A) 07/24/2023 0318   NITRITE NEGATIVE 07/24/2023 0318   LEUKOCYTESUR MODERATE (A) 07/24/2023 0318     Imaging results:   No results found.   EKG:   Vent. rate 77 BPM PR interval 150 ms QRS duration 145 ms QT/QTcB 442/501 ms P-R-T axes 57 59 6 Sinus rhythm Right bundle branch block No significant change since last tracing   Assessment & Plan  Active Problems:   Hypokalemia  Hypokalemia -Is most likely in the setting of her current dose of diuresis as she is on torsemide 60 mg oral daily, this seems to be a recurrent problem as during last admission in May her potassium was <2, but this was replaced then. -She received potassium supplement in ED, total of 30 mEq of IV potassium, and 60 mEQ  of p.o. potassium, all 12.5 mg of p.o. Aldactone, she will need to be on potassium supplement on discharge, repeat potassium level at 3 after supplement here, so patient is to continue with 40 mEq oral daily for next 5 days, and then to continue 20 mEq daily after that, with close monitoring of potassium level, would recommend rechecking BMP in 3 days, and adjust further if needed, as well  may consider low-dose Aldactone if her potassium  remains low despite p.o. supplements. -Her QTc has improved after replacing her potassium at 501 which is at her baseline (was 5.9 during previous hospitalization in August.    Thank you for the consult,    Huey Bienenstock M.D on 11/20/2023 at 5:54 PM     [facility]   Office  [phone]

## 2023-11-20 NOTE — ED Triage Notes (Signed)
Patient BIB [facility] from [facility]. Was seen this am for mamagram was discharged prior to labs being read, Potassium was low.  Hx of Lt breast cancer. Takes fluid pill but no potassium pill. Patient stated this is the second time since August.

## 2023-11-24 ENCOUNTER — Other Ambulatory Visit: Payer: Medicare Other

## 2023-11-24 LAB — COMPREHENSIVE METABOLIC PANEL
ALT: 9 U/L (ref 0–44)
AST: 22 U/L (ref 15–41)
Albumin: 3.4 g/dL — ABNORMAL LOW (ref 3.5–5.0)
Alkaline Phosphatase: 55 U/L (ref 38–126)
Anion gap: 15 (ref 5–15)
BUN: 24 mg/dL — ABNORMAL HIGH (ref 8–23)
CO2: 30 mmol/L (ref 22–32)
Calcium: 9.5 mg/dL (ref 8.9–10.3)
Chloride: 90 mmol/L — ABNORMAL LOW (ref 98–111)
Creatinine, Ser: 1.19 mg/dL — ABNORMAL HIGH (ref 0.44–1.00)
GFR, Estimated: 48 mL/min — ABNORMAL LOW (ref >60)
Glucose, Bld: 146 mg/dL — ABNORMAL HIGH (ref 70–99)
Potassium: 2.6 mmol/L — CL (ref 3.5–5.1)
Sodium: 135 mmol/L (ref 135–145)
Total Bilirubin: 0.4 mg/dL (ref <1.2)
Total Protein: 7.5 g/dL (ref 6.5–8.1)

## 2023-11-30 NOTE — Progress Notes (Signed)
[facility] [address] [place] [address]    Clinic Day:  12/01/2023  Referring physician: Pearson Grippe, MD  Patient Care Team: Pearson Grippe, MD as PCP - General (Internal Medicine)   ASSESSMENT & PLAN:   Assessment: 1.  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 -She was given letrozole for 4 months to conserve her breast.  She underwent mastectomy and lymph node dissection on 08/27/2010.  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  No invasive cancer. -She reportedly underwent radiation which was completed on 12/13/2010. -She was on letrozole which she took for 5 years.   2.  Osteoporosis: -DEXA scan on 11/08/2019 shows T score of -2.1. -She received a total of 2 doses of Prolia in 2016 and 2017, discontinued secondary to noncoverage by insurance. - DEXA scan on 11/14/2021 with T-score -2.6.  Prolia started back on 05/31/2022.    Plan: 1.  Left breast cancer, ER/PR positive and HER-2 negative: - Left mastectomy site is within normal limits with no palpable masses or adenopathy. - Mammogram of the right breast On 11/20/2023: BI-RADS Category 1. - Reviewed labs from 11/20/2023: Normal LFTs.  CBC grossly normal.  She has severe hypokalemia for which she was started on potassium and the nursing home.  Levels will be followed at the nursing home. - She would like to continue follow-up with [place].  RTC 1 year with repeat mammogram and labs.    2.  Osteoporosis: - Last dose of Prolia on 05/31/2022. - She is having a dental extraction done in the next month or 2.  I will hold her dose today. - Vitamin D level is 74.  Continue vitamin D 1000 units twice daily.    Orders Placed This Encounter  Procedures   MM 3D SCREENING MAMMOGRAM UNILATERAL RIGHT BREAST    Standing Status:   Future    Expected Date:   11/24/2024    Expiration Date:    11/30/2024    Reason for Exam (SYMPTOM  OR DIAGNOSIS REQUIRED):   breast cancer screening    Preferred imaging location?:   [facility]   CBC with Differential    Standing Status:   Future    Expected Date:   11/24/2024    Expiration Date:   11/30/2024   Comprehensive metabolic panel    Standing Status:   Future    Expected Date:   11/24/2024    Expiration Date:   11/30/2024   VITAMIN D 25 Hydroxy (Vit-D Deficiency, Fractures)    Standing Status:   Future    Expected Date:   11/24/2024    Expiration Date:   11/30/2024      I,Katie Daubenspeck,acting as a scribe for Doreatha Massed, MD.,have documented all relevant documentation on the behalf of Doreatha Massed, MD,as directed by  Doreatha Massed, MD while in the presence of Doreatha Massed, MD.   I, Doreatha Massed MD, have reviewed the above documentation for accuracy and completeness, and I agree with the above.   Doreatha Massed, MD   12/16/202412:58 PM  CHIEF COMPLAINT:   Diagnosis: left breast cancer    Cancer Staging  No matching staging information was found for the patient.    Prior Therapy: 1. Letrozole, 03/2010 - 07/2010 2. Left mastectomy, 08/27/10 3. XRT to left chest wall, completed 12/13/10 4. 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   04/16/2010 - 08/20/2010 Anti-estrogen oral therapy   Letrozole x 4 months pre-operatively in an attempt to preserve breast with lumpectomy   08/27/2010 Definitive Surgery   Left mastectomy.  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  ER 100%, PR 100%, HER2 NEGATIVE.   11/01/2010 - 12/13/2010 Radiation Therapy   Radiation therapy    12/14/2010 - 03/14/2016 Anti-estrogen oral therapy   Femara x 5 years   09/29/2015 Imaging   Bone density- BMD as determined from Femur Neck Right is 0.860 g/cm2 with a T-Score of -1.3. This patient is considered osteopenic according to World Health Organization [facility]) criteria.       INTERVAL HISTORY:   Deanna Henderson is a 72 y.o. female presenting to clinic today for follow up of left breast cancer. She was last seen by me on 11/21/22.  Since her last visit, she underwent screening mammogram on 11/20/23. Results were negative.  Today, she states that she is doing well overall. Her appetite level is at 100%. Her energy level is at 75%.  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   History of double vision    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   Obesity 01/22/2012   Osteopenia 01/19/2015   Wears partial dentures     Surgical History: Past Surgical History:  Procedure Laterality Date   MASTECTOMY, PARTIAL  2011   lt mod radical mastectomy   MULTIPLE TOOTH EXTRACTIONS     STRABISMUS SURGERY  07/24/2012   Procedure: REPAIR STRABISMUS BILATERAL;  Surgeon: Shara Blazing, MD;  Location: [facility];  Service: Ophthalmology;  Laterality: Bilateral;    Social History: Social History   Socioeconomic History   Marital status: Single    Spouse name: Not on file   Number of children: Not on file   Years of education: Not on file   Highest education level: Not on file  Occupational History   Not on file  Tobacco Use   Smoking status: Never   Smokeless tobacco: Never  Vaping Use   Vaping status: Never Used  Substance and Sexual Activity   Alcohol use: No   Drug use: No   Sexual activity: Not Currently  Other Topics Concern   Not on file  Social History Narrative   Not on file    Social Drivers of Health   Financial Resource Strain: Low Risk  (11/16/2020)   Overall Financial Resource Strain (CARDIA)    Difficulty of Paying Living Expenses: Not hard at all  Food Insecurity: No Food Insecurity (07/26/2023)   Hunger Vital Sign    Worried About Running Out of Food in the Last Year: Never true    Ran Out of Food in the Last Year: Never true  Transportation Needs: No Transportation Needs (07/26/2023)   PRAPARE - Administrator, Civil Service (Medical): No    Lack of Transportation (Non-Medical): No  Physical Activity: Insufficiently Active (11/16/2020)   Exercise Vital Sign    Days of Exercise per Week:  2 days    Minutes of Exercise per Session: 10 min  Stress: No Stress Concern Present (11/16/2020)   [employer] of Occupational Health - Occupational Stress Questionnaire    Feeling of Stress : Not at all  Social Connections: Moderately Integrated (11/16/2020)   Social Connection and Isolation Panel [NHANES]    Frequency of Communication with Friends and Family: Three times a week    Frequency of Social Gatherings with Friends and Family: Never    Attends Religious Services: More than 4 times per year    Active Member of [employer] or Organizations: Yes    Attends Banker Meetings: Never    Marital Status: Never married  Intimate Partner Violence: Not At Risk (07/26/2023)   Humiliation, Afraid, Rape, and Kick questionnaire    Fear of Current or Ex-Partner: No    Emotionally Abused: No    Physically Abused: No    Sexually Abused: No    Family History: Family History  Problem Relation Age of Onset   Cancer Mother    Cancer Father    Hypertension Sister    Diabetes Sister    Cancer Sister     Current Medications:  Current Outpatient Medications:    acetaminophen (TYLENOL) 500 MG tablet, Take 500 mg by mouth every 8 (eight) hours as needed for mild pain (pain score 1-3)., Disp: , Rfl:    benzonatate (TESSALON) 200 MG capsule, Take  200 mg by mouth 3 (three) times daily as needed for cough., Disp: , Rfl:    Calcium Carbonate (CALCIUM 600 HIGH POTENCY PO), Take 600 mg by mouth 2 (two) times daily. , Disp: , Rfl:    cholecalciferol (VITAMIN D3) 25 MCG (1000 UNIT) tablet, Take 1,000 Units by mouth in the morning and at bedtime., Disp: , Rfl:    donepezil (ARICEPT) 10 MG tablet, Take 10 mg by mouth daily.  , Disp: , Rfl:    fluconazole (DIFLUCAN) 150 MG tablet, Take 150 mg by mouth once., Disp: , Rfl:    fluticasone (FLONASE) 50 MCG/ACT nasal spray, Place 2 sprays into both nostrils daily., Disp: , Rfl:    ipratropium-albuterol (DUONEB) 0.5-2.5 (3) MG/3ML SOLN, Inhale 3 mLs into the lungs 3 (three) times daily., Disp: , Rfl:    levothyroxine (SYNTHROID) 75 MCG tablet, Take 75 mcg by mouth daily before breakfast., Disp: , Rfl:    loratadine (CLARITIN) 10 MG tablet, Take 10 mg by mouth daily., Disp: , Rfl: 3   Multiple Vitamins-Minerals ([employer] SPECTRAVITE SENIOR PO), Take 1 tablet by mouth daily., Disp: , Rfl:    potassium chloride SA (KLOR-CON M) 20 MEQ tablet, 2 tablets by mouth once a day for 5 days, then 1 tablet daily thereafter, Disp: 30 tablet, Rfl: 0   Propylene Glycol (SYSTANE BALANCE OP), Apply 1 drop to eye in the morning and at bedtime., Disp: , Rfl:    simvastatin (ZOCOR) 40 MG tablet, Take 40 mg by mouth at bedtime.  , Disp: , Rfl:    torsemide (DEMADEX) 20 MG tablet, Take 60 mg by mouth in the morning, at noon, and at bedtime., Disp: , Rfl:    Allergies: Allergies  Allergen Reactions   Codeine Nausea Only    REVIEW OF SYSTEMS:   Review of Systems  Constitutional:  Negative for chills, fatigue and fever.  HENT:   Negative for lump/mass, mouth sores, nosebleeds, sore throat and trouble swallowing.   Eyes:  Negative for eye problems.  Respiratory:  Positive for shortness of breath.  Negative for cough.   Cardiovascular:  Negative for chest pain, leg swelling and palpitations.  Gastrointestinal:  Negative for  abdominal pain, constipation, diarrhea, nausea and vomiting.  Genitourinary:  Negative for bladder incontinence, difficulty urinating, dysuria, frequency, hematuria and nocturia.   Musculoskeletal:  Negative for arthralgias, back pain, flank pain, myalgias and neck pain.  Skin:  Negative for itching and rash.  Neurological:  Negative for dizziness, headaches and numbness.  Hematological:  Does not bruise/bleed easily.  Psychiatric/Behavioral:  Negative for depression, sleep disturbance and suicidal ideas. The patient is not nervous/anxious.   All other systems reviewed and are negative.    VITALS:   Blood pressure 108/62, pulse 88, temperature 98.2 F (36.8 C), temperature source Oral, resp. rate 16, SpO2 98%.  Wt Readings from Last 3 Encounters:  11/20/23 177 lb (80.3 kg)  07/26/23 212 lb 4.9 oz (96.3 kg)  11/21/22 213 lb (96.6 kg)    There is no height or weight on file to calculate BMI.  Performance status (ECOG): 2 - Symptomatic, <50% confined to bed  PHYSICAL EXAM:   Physical Exam Vitals and nursing note reviewed. Exam conducted with a chaperone present.  Constitutional:      Appearance: Normal appearance.  Cardiovascular:     Rate and Rhythm: Normal rate and regular rhythm.     Pulses: Normal pulses.     Heart sounds: Normal heart sounds.  Pulmonary:     Effort: Pulmonary effort is normal.     Breath sounds: Normal breath sounds.  Abdominal:     Palpations: Abdomen is soft. There is no hepatomegaly, splenomegaly or mass.     Tenderness: There is no abdominal tenderness.  Musculoskeletal:     Right lower leg: No edema.     Left lower leg: No edema.  Lymphadenopathy:     Cervical: No cervical adenopathy.     Right cervical: No superficial, deep or posterior cervical adenopathy.    Left cervical: No superficial, deep or posterior cervical adenopathy.     Upper Body:     Right upper body: No supraclavicular or axillary adenopathy.     Left upper body: No  supraclavicular or axillary adenopathy.  Neurological:     General: No focal deficit present.     Mental Status: She is alert and oriented to person, place, and time.  Psychiatric:        Mood and Affect: Mood normal.        Behavior: Behavior normal.     LABS:      Latest Ref Rng & Units 11/20/2023    2:11 PM 11/20/2023   10:39 AM 07/28/2023    3:59 AM  CBC  WBC 4.0 - 10.5 K/uL 9.5  9.6  10.1   Hemoglobin 12.0 - 15.0 g/dL 40.9  81.1  8.5   Hematocrit 36.0 - 46.0 % 39.3  35.5  28.4   Platelets 150 - 400 K/uL 325  344  340       Latest Ref Rng & Units 11/20/2023    7:28 PM 11/20/2023    2:11 PM 11/20/2023   10:39 AM  CMP  Glucose 70 - 99 mg/dL 914  782  956   BUN 8 - 23 mg/dL 23  24  25    Creatinine 0.44 - 1.00 mg/dL 2.13  0.86  5.78   Sodium 135 - 145 mmol/L 135  135  135   Potassium 3.5 - 5.1 mmol/L 3.0  2.6  C 2.4   Chloride 98 -  111 mmol/L 95  90  91   CO2 22 - 32 mmol/L 29  30  32   Calcium 8.9 - 10.3 mg/dL 9.2  9.5  9.0   Total Protein 6.5 - 8.1 g/dL  7.5  6.9   Total Bilirubin <1.2 mg/dL  0.4  0.5   Alkaline Phos 38 - 126 U/L  55  49   AST 15 - 41 U/L  22  17   ALT 0 - 44 U/L  9  10     C Corrected result     No results found for: "CEA1", "CEA" / No results found for: "CEA1", "CEA" No results found for: "PSA1" No results found for: "WUJ811" No results found for: "CAN125"  No results found for: "TOTALPROTELP", "ALBUMINELP", "A1GS", "A2GS", "BETS", "BETA2SER", "GAMS", "MSPIKE", "SPEI" Lab Results  Component Value Date   TIBC 206 (L) 07/24/2023   FERRITIN 1,309 (H) 07/24/2023   IRONPCTSAT 9 (L) 07/24/2023   No results found for: "LDH"   STUDIES:   MM 3D SCREEN BREAST UNI RIGHT Result Date: 11/21/2023 CLINICAL DATA:  Screening. EXAM: DIGITAL SCREENING UNILATERAL RIGHT MAMMOGRAM WITH CAD AND TOMOSYNTHESIS TECHNIQUE: Right screening digital craniocaudal and mediolateral oblique mammograms were obtained. Right screening digital breast tomosynthesis was  performed. The images were evaluated with computer-aided detection. COMPARISON:  Previous exam(s). ACR Breast Density Category c: The breasts are heterogeneously dense, which may obscure small masses. FINDINGS: The patient has had a left mastectomy. There are no findings suspicious for malignancy. IMPRESSION: No mammographic evidence of malignancy. A result letter of this screening mammogram will be mailed directly to the patient. RECOMMENDATION: Screening mammogram in one year.  (Code:[id number]-R-47M) BI-RADS CATEGORY  1: Negative. Electronically Signed   By: Norva Pavlov M.D.   On: 11/21/2023 14:53

## 2023-12-01 ENCOUNTER — Inpatient Hospital Stay (HOSPITAL_BASED_OUTPATIENT_CLINIC_OR_DEPARTMENT_OTHER): Payer: Medicare Other | Admitting: Hematology

## 2023-12-01 ENCOUNTER — Inpatient Hospital Stay: Payer: Medicare Other

## 2023-12-01 VITALS — BP 108/62 | HR 88 | Temp 98.2°F | Resp 16

## 2023-12-01 DIAGNOSIS — E876 Hypokalemia: Secondary | ICD-10-CM | POA: Diagnosis not present

## 2023-12-01 DIAGNOSIS — Z170 Estrogen receptor positive status [ER+]: Secondary | ICD-10-CM | POA: Insufficient documentation

## 2023-12-01 DIAGNOSIS — Z1231 Encounter for screening mammogram for malignant neoplasm of breast: Secondary | ICD-10-CM

## 2023-12-01 DIAGNOSIS — E559 Vitamin D deficiency, unspecified: Secondary | ICD-10-CM | POA: Diagnosis not present

## 2023-12-01 DIAGNOSIS — Z9012 Acquired absence of left breast and nipple: Secondary | ICD-10-CM | POA: Diagnosis not present

## 2023-12-01 DIAGNOSIS — C50912 Malignant neoplasm of unspecified site of left female breast: Secondary | ICD-10-CM | POA: Insufficient documentation

## 2023-12-01 DIAGNOSIS — Z923 Personal history of irradiation: Secondary | ICD-10-CM | POA: Insufficient documentation

## 2023-12-01 DIAGNOSIS — M810 Age-related osteoporosis without current pathological fracture: Secondary | ICD-10-CM | POA: Diagnosis not present

## 2023-12-01 DIAGNOSIS — Z79811 Long term (current) use of aromatase inhibitors: Secondary | ICD-10-CM | POA: Diagnosis not present

## 2023-12-01 NOTE — Patient Instructions (Addendum)
[facility] at [facility] Discharge Instructions   You were seen and examined today by Dr. Ellin Saba.  He reviewed the results of your lab work. Except for your potassium, which was low, all results were normal/stable.   He reviewed the results of your mammogram which was normal.   We will hold your Prolia shot today due to upcoming dental work.   We will see you back in one year. We will repeat lab work and a mammogram prior to your visit.   Return as scheduled.    Thank you for choosing [facility] at [facility] to provide your oncology and hematology care.  To afford each patient quality time with our provider, please arrive at least 15 minutes before your scheduled appointment time.   If you have a lab appointment with the Cancer Center please come in thru the Main Entrance and check in at the main information desk.  You need to re-schedule your appointment should you arrive 10 or more minutes late.  We strive to give you quality time with our providers, and arriving late affects you and other patients whose appointments are after yours.  Also, if you no show three or more times for appointments you may be dismissed from the clinic at the providers discretion.     Again, thank you for choosing [facility].  Our hope is that these requests will decrease the amount of time that you wait before being seen by our physicians.       _____________________________________________________________  Should you have questions after your visit to [facility], please contact our office at [phone] and follow the prompts.  Our office hours are 8:00 a.m. and 4:30 p.m. Monday - Friday.  Please note that voicemails left after 4:00 p.m. may not be returned until the following business day.  We are closed weekends and major holidays.  You do have access to a nurse 24-7, just call the main number to the clinic [phone] and do  not press any options, hold on the line and a nurse will answer the phone.    For prescription refill requests, have your pharmacy contact our office and allow 72 hours.    Due to Covid, you will need to wear a mask upon entering the hospital. If you do not have a mask, a mask will be given to you at the Main Entrance upon arrival. For doctor visits, patients may have 1 support person age 19 or older with them. For treatment visits, patients can not have anyone with them due to social distancing guidelines and our immunocompromised population.

## 2024-06-08 ENCOUNTER — Encounter (HOSPITAL_COMMUNITY): Payer: Self-pay | Admitting: Hematology

## 2024-10-23 ENCOUNTER — Encounter (HOSPITAL_COMMUNITY): Payer: Self-pay | Admitting: Oncology

## 2024-11-15 ENCOUNTER — Encounter (HOSPITAL_COMMUNITY): Payer: Self-pay | Admitting: Oncology

## 2024-11-22 ENCOUNTER — Inpatient Hospital Stay: Payer: Medicare Other | Attending: Internal Medicine

## 2024-11-22 ENCOUNTER — Ambulatory Visit (HOSPITAL_COMMUNITY): Payer: Medicare Other

## 2024-11-30 ENCOUNTER — Inpatient Hospital Stay: Payer: Medicare Other | Admitting: Oncology
